# Patient Record
Sex: Female | Born: 1985 | Race: Black or African American | Hispanic: No | Marital: Married | Smoking: Never smoker
Health system: Southern US, Community
[De-identification: ages and names within clinical notes are randomized; demographics above are authoritative.]

## PROBLEM LIST (undated history)

## (undated) DIAGNOSIS — E119 Type 2 diabetes mellitus without complications: Secondary | ICD-10-CM

## (undated) DIAGNOSIS — E282 Polycystic ovarian syndrome: Secondary | ICD-10-CM

## (undated) DIAGNOSIS — E785 Hyperlipidemia, unspecified: Secondary | ICD-10-CM

## (undated) DIAGNOSIS — I4891 Unspecified atrial fibrillation: Secondary | ICD-10-CM

## (undated) DIAGNOSIS — I10 Essential (primary) hypertension: Secondary | ICD-10-CM

## (undated) HISTORY — DX: Unspecified atrial fibrillation: I48.91

## (undated) HISTORY — PX: COSMETIC SURGERY: SHX468

## (undated) HISTORY — DX: Polycystic ovarian syndrome: E28.2

## (undated) HISTORY — DX: Type 2 diabetes mellitus without complications: E11.9

## (undated) HISTORY — DX: Essential (primary) hypertension: I10

## (undated) HISTORY — DX: Hyperlipidemia, unspecified: E78.5

---
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
## 2006-04-13 ENCOUNTER — Emergency Department (HOSPITAL_COMMUNITY): Admission: EM | Admit: 2006-04-13 | Discharge: 2006-04-13 | Payer: Self-pay | Admitting: Emergency Medicine

## 2008-04-04 ENCOUNTER — Encounter: Admission: RE | Admit: 2008-04-04 | Discharge: 2008-04-04 | Payer: Self-pay | Admitting: Emergency Medicine

## 2009-05-24 ENCOUNTER — Emergency Department (HOSPITAL_COMMUNITY): Admission: EM | Admit: 2009-05-24 | Discharge: 2009-05-24 | Payer: Self-pay | Admitting: Gastroenterology

## 2011-10-06 ENCOUNTER — Ambulatory Visit (INDEPENDENT_AMBULATORY_CARE_PROVIDER_SITE_OTHER): Payer: 59 | Admitting: Family Medicine

## 2011-10-06 VITALS — BP 120/72 | HR 74 | Temp 98.0°F | Resp 16 | Ht 66.5 in | Wt 261.0 lb

## 2011-10-06 DIAGNOSIS — Z23 Encounter for immunization: Secondary | ICD-10-CM

## 2011-10-06 DIAGNOSIS — N938 Other specified abnormal uterine and vaginal bleeding: Secondary | ICD-10-CM

## 2011-10-06 DIAGNOSIS — Z3009 Encounter for other general counseling and advice on contraception: Secondary | ICD-10-CM

## 2011-10-06 DIAGNOSIS — Z0000 Encounter for general adult medical examination without abnormal findings: Secondary | ICD-10-CM

## 2011-10-06 DIAGNOSIS — N949 Unspecified condition associated with female genital organs and menstrual cycle: Secondary | ICD-10-CM

## 2011-10-06 DIAGNOSIS — N926 Irregular menstruation, unspecified: Secondary | ICD-10-CM

## 2011-10-06 LAB — POCT CBC
HCT, POC: 43.5 % (ref 37.7–47.9)
MCH, POC: 28 pg (ref 27–31.2)
MCV: 91 fL (ref 80–97)
MID (cbc): 0.4 (ref 0–0.9)
POC LYMPH PERCENT: 30 %L (ref 10–50)
Platelet Count, POC: 357 10*3/uL (ref 142–424)
RBC: 4.78 M/uL (ref 4.04–5.48)
WBC: 6 10*3/uL (ref 4.6–10.2)

## 2011-10-06 LAB — COMPREHENSIVE METABOLIC PANEL
AST: 15 U/L (ref 0–37)
Albumin: 4.1 g/dL (ref 3.5–5.2)
BUN: 13 mg/dL (ref 6–23)
Calcium: 9.6 mg/dL (ref 8.4–10.5)
Chloride: 104 mEq/L (ref 96–112)
Creat: 0.67 mg/dL (ref 0.50–1.10)
Glucose, Bld: 96 mg/dL (ref 70–99)
Potassium: 4.3 mEq/L (ref 3.5–5.3)

## 2011-10-06 LAB — LIPID PANEL
Cholesterol: 143 mg/dL (ref 0–200)
LDL Cholesterol: 88 mg/dL (ref 0–99)
VLDL: 10 mg/dL (ref 0–40)

## 2011-10-06 LAB — POCT URINE PREGNANCY: Preg Test, Ur: NEGATIVE

## 2011-10-06 MED ORDER — NORGESTIMATE-ETH ESTRADIOL 0.25-35 MG-MCG PO TABS: 1.0000 | ORAL_TABLET | Freq: Every day | ORAL | Status: DC

## 2011-10-06 NOTE — Patient Instructions (Signed)
Abstinence or strict condom use for next 2 weeks, then if negative urine pregnancy test, can start sprintec - 1 per day. See instructions below. Your should receive a call or letter about your lab results within the next week to 10 days.  Return to the clinic or go to the nearest emergency room if any of your symptoms worsen or new symptoms occur. Oral Contraception Use Oral contraceptives (OCs) are medicines taken to prevent pregnancy. OCs work by preventing the ovaries from releasing eggs. The hormones in OCs also cause the cervical mucus to thicken, preventing the sperm from entering the uterus. The hormones also cause the uterine lining to become thin, not allowing a fertilized egg to attach to the inside of the uterus. OCs are highly effective when taken exactly as prescribed. However, OCs do not prevent sexually transmitted diseases (STDs). Safe sex practices, such as using condoms along with an OC, can help prevent STDs.  Before taking OCs, you may have a physical exam and Pap test. Your caregiver may also order blood tests if necessary. Your caregiver will make sure you are a good candidate for oral contraception. Discuss with your caregiver the possible side effects of the OC you may be prescribed. When starting an OC, it can take 2 to 3 months for the body to adjust to the changes in hormone levels in your body.  HOW TO TAKE ORAL CONTRACEPTIVES Your caregiver may advise you on how to start taking the first cycle of OCs. Otherwise, you can:  Start on day 1 of your menstrual period. You will not need any backup contraceptive protection with this start time.   Start on the first Sunday after your menstrual period or the day you get your prescription. In these cases, you will need to use backup contraceptive protection for the first 7-day cycle.  After you have started taking OCs:  If you forget to take 1 pill, take it as soon as you remember. Take the next pill at the regular time.   If you  miss 2 or more pills, use backup birth control until your next menstrual period starts.   If you use a 28-day pack that contains inactive pills and you miss 1 of the last 7 pills (pills with no hormones), it will not matter. Throw away the rest of the non-hormone pills and start a new pill pack.  No matter which day you start the OC, you will always start a new pack on that same day of the week. Have an extra pack of OCs and a backup contraceptive method available in case you miss some pills or lose your OC pack. HOME CARE INSTRUCTIONS   Do not smoke.   Always use a condom to protect against STDs. OCs do not protect against STDs.   Use a calendar to mark your menstrual period days.   Read the information and directions that come with your OC. Talk to your caregiver if you have questions.  SEEK MEDICAL CARE IF:   You develop nausea and vomiting.   You have abnormal vaginal discharge or bleeding.   You develop a rash.   You miss your menstrual period.   You are losing your hair.   You need treatment for mood swings or depression.   You get dizzy when taking the OC.   You develop acne from taking the OC.   You become pregnant.  SEEK IMMEDIATE MEDICAL CARE IF:   You develop chest pain.   You develop shortness of  breath.   You have an uncontrolled or severe headache.   You develop numbness or slurred speech.   You develop visual problems.   You develop pain, redness, and swelling in the legs.  Document Released: 12/19/2010 Document Reviewed: 12/17/2010 [facility] Patient Information 2012 [place], [place].

## 2011-10-06 NOTE — Progress Notes (Signed)
Subjective:    Patient ID: Pamela Duarte, female    DOB: Jan 08, 1986, 26 y.o.   MRN: [id number]  HPI Pamela Duarte is a 26 y.o. female  Would like physical, but concerned that has not had a period since July, Hx of irregular periods since 26yo. Usually only misses 1 month, then restarts. Pregnancy tests at home negative - including last night. No ocp's. No recent change in weight, no change in exercise.  Sexually active - G1P0100 in 2006.  elective Ab at 3 months. Boyfriend had vasectomy.  Tried few contraceptives, but would forget to take. Nonsmoker. Last Pap in 2009 -normal. No known hx of abnormals.  Has not had Gardasil. No known hx of menactra.   Plans.on travel nursing.  Needs Hep B titer, MMR titer, varicella titer - thinks had done in 2010 - employee health should have.     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   Review of Systems 13 point ROS noted on PHS.  menstrual problems see above. No other positive responses.     Objective:   Physical Exam  Constitutional: She is oriented to person, place, and time. She appears well-developed and well-nourished.       Overweight.   HENT:  Head: Normocephalic and atraumatic.  Right Ear: External ear normal.  Left Ear: External ear normal.  Mouth/Throat: Oropharynx is clear and moist.  Eyes: Conjunctivae normal are normal. Pupils are equal, round, and reactive to light.  Neck: Normal range of motion. Neck supple. No thyromegaly present.  Cardiovascular: Normal rate, regular rhythm, normal heart sounds and intact distal pulses.   No murmur heard. Pulmonary/Chest: Effort normal and breath sounds normal. No respiratory distress. She has no wheezes.  Abdominal: Soft. Bowel sounds are normal. There is no tenderness.  Genitourinary: Vagina normal. Pelvic exam was performed with patient supine.       Pap smear obtained, but difficult exam for cervical swab/brush - discussed possible repeat if  insufficient.  Musculoskeletal: Normal range of motion. She exhibits no edema and no tenderness.  Lymphadenopathy:    She has no cervical adenopathy.  Neurological: She is alert and oriented to person, place, and time.  Skin: Skin is warm and dry. No rash noted.  Psychiatric: She has a normal mood and affect. Her behavior is normal. Thought content normal.    Results for orders placed in visit on 10/06/11  POCT URINE PREGNANCY      Component Value Range   Preg Test, Ur Negative         Assessment & Plan:  Pamela Duarte is a 26 y.o. female 1. Menstrual period late  POCT urine pregnancy  2. Annual physical exam  POCT CBC, Comprehensive metabolic panel, Lipid panel, HPV vaccine quadravalent 3 dose IM, Pap IG, CT/NG w/ reflex HPV when ASC-U, HPV vaccine quadravalent 3 dose IM  3. Counseling for birth control, oral contraceptives  Pap IG, CT/NG w/ reflex HPV when ASC-U, norgestimate-ethinyl estradiol (ORTHO-CYCLEN,SPRINTEC,PREVIFEM) 0.25-35 MG-MCG tablet  4. Need for HPV vaccination  HPV vaccine quadravalent 3 dose IM, HPV vaccine quadravalent 3 dose IM  5. Need for meningococcus vaccine  Meningococcal conjugate vaccine 4-valent IM   Overweight may be contributor to irregular menses.  Options for contraception discussed - will start Sprintec.  Breakthrough bleeding discussed.   Menactra given as MICU nurse and possible exposure. Gardasil #1 given.  Labs as above.    Patient Instructions  Abstinence or strict condom use  for next 2 weeks, then if negative urine pregnancy test, can start sprintec - 1 per day. See instructions below. Your should receive a call or letter about your lab results within the next week to 10 days.  Return to the clinic or go to the nearest emergency room if any of your symptoms worsen or new symptoms occur. Oral Contraception Use Oral contraceptives (OCs) are medicines taken to prevent pregnancy. OCs work by preventing the ovaries from releasing eggs. The  hormones in OCs also cause the cervical mucus to thicken, preventing the sperm from entering the uterus. The hormones also cause the uterine lining to become thin, not allowing a fertilized egg to attach to the inside of the uterus. OCs are highly effective when taken exactly as prescribed. However, OCs do not prevent sexually transmitted diseases (STDs). Safe sex practices, such as using condoms along with an OC, can help prevent STDs.  Before taking OCs, you may have a physical exam and Pap test. Your caregiver may also order blood tests if necessary. Your caregiver will make sure you are a good candidate for oral contraception. Discuss with your caregiver the possible side effects of the OC you may be prescribed. When starting an OC, it can take 2 to 3 months for the body to adjust to the changes in hormone levels in your body.  HOW TO TAKE ORAL CONTRACEPTIVES Your caregiver may advise you on how to start taking the first cycle of OCs. Otherwise, you can:  Start on day 1 of your menstrual period. You will not need any backup contraceptive protection with this start time.   Start on the first Sunday after your menstrual period or the day you get your prescription. In these cases, you will need to use backup contraceptive protection for the first 7-day cycle.  After you have started taking OCs:  If you forget to take 1 pill, take it as soon as you remember. Take the next pill at the regular time.   If you miss 2 or more pills, use backup birth control until your next menstrual period starts.   If you use a 28-day pack that contains inactive pills and you miss 1 of the last 7 pills (pills with no hormones), it will not matter. Throw away the rest of the non-hormone pills and start a new pill pack.  No matter which day you start the OC, you will always start a new pack on that same day of the week. Have an extra pack of OCs and a backup contraceptive method available in case you miss some pills or lose  your OC pack. HOME CARE INSTRUCTIONS   Do not smoke.   Always use a condom to protect against STDs. OCs do not protect against STDs.   Use a calendar to mark your menstrual period days.   Read the information and directions that come with your OC. Talk to your caregiver if you have questions.  SEEK MEDICAL CARE IF:   You develop nausea and vomiting.   You have abnormal vaginal discharge or bleeding.   You develop a rash.   You miss your menstrual period.   You are losing your hair.   You need treatment for mood swings or depression.   You get dizzy when taking the OC.   You develop acne from taking the OC.   You become pregnant.  SEEK IMMEDIATE MEDICAL CARE IF:   You develop chest pain.   You develop shortness of breath.   You  have an uncontrolled or severe headache.   You develop numbness or slurred speech.   You develop visual problems.   You develop pain, redness, and swelling in the legs.  Document Released: 12/19/2010 Document Reviewed: 12/17/2010 [facility] Patient Information 2012 [place], [place].

## 2011-10-08 LAB — PAP IG, CT-NG, RFX HPV ASCU

## 2011-10-23 ENCOUNTER — Telehealth: Payer: Self-pay

## 2011-10-23 NOTE — Telephone Encounter (Signed)
Spoke with pt and answered questions, Dr Neva Seat has sheet. Awaiting for him to sign.

## 2011-10-23 NOTE — Progress Notes (Signed)
  Tuberculosis Risk Questionnaire  1. Were you born outside the [place] in one of the following parts of the world:    [place], [place], [place], [place] or [place]?  No   2. Have you traveled outside the [place] and lived for more than one month in one of the following parts of the world:  [place], [place], [place], [place] or [place]?  No   3. Do you have a compromised immune system such as from any of the following conditions:  HIV/AIDS, organ or bone marrow transplantation, diabetes, immunosuppressive   medicines (e.g. Prednisone, Remicaide), leukemia, lymphoma, cancer of the   head or neck, gastrectomy or jejunal bypass, end-stage renal disease (on   dialysis), or silicosis?   No    4. Have you ever done one of the following:    Used crack cocaine, injected illegal drugs, worked or resided in jail or prison,   worked or resided at a homeless shelter, or worked as a Research scientist (physical sciences) in   direct contact with patients?  No  5. Have you ever been exposed to anyone with infectious tuberculosis?  No   Tuberculosis Symptom Questionnaire  Do you currently have any of the following symptoms?  1. Unexplained cough lasting more than 3 weeks?  No  Unexplained fever lasting more than 3 weeks. No   3. Night Sweats (sweating that leaves the bedclothes and sheets wet)  No  4. Shortness of Breath  No  5. Chest Pain No   6. Unintentional weight loss  No  7. Unexplained fatigue (very tired for no reason) No

## 2011-10-23 NOTE — Telephone Encounter (Signed)
Pt brought form by to be filled out/signed by Dr Neva Seat from [facility] done end of Sept. I have put the form in Dr Paralee Cancel box.

## 2011-10-23 NOTE — Telephone Encounter (Signed)
I reviewd paperwork, but TB history/screening not done at office visit. Call pt - review questions on tb risk and sx questionaire.  I will review/complete form based on this.

## 2011-10-24 NOTE — Telephone Encounter (Signed)
Dr Paralee Cancel asst, Shanda Bumps faxed form and notified pt that it was done. Marked form to be scanned.

## 2011-10-24 NOTE — Telephone Encounter (Signed)
Form completed and ready for p/u

## 2012-03-24 ENCOUNTER — Ambulatory Visit (INDEPENDENT_AMBULATORY_CARE_PROVIDER_SITE_OTHER): Payer: No Typology Code available for payment source | Admitting: Emergency Medicine

## 2012-03-24 ENCOUNTER — Ambulatory Visit: Payer: No Typology Code available for payment source

## 2012-03-24 VITALS — BP 118/82 | HR 93 | Temp 98.3°F | Resp 18 | Ht 67.0 in | Wt 265.0 lb

## 2012-03-24 DIAGNOSIS — Z111 Encounter for screening for respiratory tuberculosis: Secondary | ICD-10-CM

## 2012-03-24 DIAGNOSIS — R7611 Nonspecific reaction to tuberculin skin test without active tuberculosis: Secondary | ICD-10-CM

## 2012-03-24 NOTE — Progress Notes (Signed)
  Subjective:    Patient ID: Pamela Duarte, female    DOB: 19-Feb-1985, 27 y.o.   MRN: [id number]  HPI 27 year old female presents for tuberculosis screening. States she had the BCG vaccine as a child and has had to get CXR's annually since.  Has always had negative CXR's and had a negative quantiferon gold last year.  Denies cough, night sweats, fever, chills, weight loss, or hemoptysis.      Review of Systems  Constitutional: Negative for fever, chills and unexpected weight change.  Respiratory: Negative for cough and shortness of breath.        Objective:   Physical Exam  Constitutional: She is oriented to person, place, and time. She appears well-developed and well-nourished.  HENT:  Head: Normocephalic and atraumatic.  Right Ear: External ear normal.  Left Ear: External ear normal.  Eyes: Conjunctivae are normal.  Neck: Normal range of motion.  Cardiovascular: Normal rate, regular rhythm and normal heart sounds.   Pulmonary/Chest: Effort normal and breath sounds normal.  Neurological: She is alert and oriented to person, place, and time.  Psychiatric: She has a normal mood and affect. Her behavior is normal. Judgment and thought content normal.      [facility] reading (PRIMARY) by  Dr. Cleta Alberts as no evidence of tuberculosis. Normal chest x-ray.      Assessment & Plan:  1. Screening for tuberculosis  -Patient screened and found to be free of tuberculosis in the communicable form.   -Follow up as needed 2. PPD positive - Plan: DG Chest 1 View

## 2012-04-15 ENCOUNTER — Ambulatory Visit (HOSPITAL_COMMUNITY)
Admission: RE | Admit: 2012-04-15 | Discharge: 2012-04-15 | Disposition: A | Discharge status: Home / Self Care | Payer: No Typology Code available for payment source | Source: Ambulatory Visit | Attending: Family Medicine | Admitting: Family Medicine

## 2012-04-15 ENCOUNTER — Ambulatory Visit (INDEPENDENT_AMBULATORY_CARE_PROVIDER_SITE_OTHER): Payer: No Typology Code available for payment source | Admitting: Family Medicine

## 2012-04-15 VITALS — BP 114/76 | HR 119 | Temp 101.2°F | Resp 16 | Ht 67.0 in | Wt 258.0 lb

## 2012-04-15 DIAGNOSIS — R1031 Right lower quadrant pain: Secondary | ICD-10-CM | POA: Insufficient documentation

## 2012-04-15 DIAGNOSIS — R109 Unspecified abdominal pain: Secondary | ICD-10-CM

## 2012-04-15 LAB — POCT WET PREP WITH KOH
KOH Prep POC: NEGATIVE
Trichomonas, UA: NEGATIVE
Yeast Wet Prep HPF POC: NEGATIVE

## 2012-04-15 LAB — POCT CBC
Granulocyte percent: 75.8 %G (ref 37–80)
HCT, POC: 35.2 % — AB (ref 37.7–47.9)
Hemoglobin: 11 g/dL — AB (ref 12.2–16.2)
Lymph, poc: 2.1 (ref 0.6–3.4)
MCH, POC: 27.8 pg (ref 27–31.2)
MCHC: 31.3 g/dL — AB (ref 31.8–35.4)
MCV: 89.2 fL (ref 80–97)
MID (cbc): 0.7 (ref 0–0.9)
MPV: 8.3 fL (ref 0–99.8)
POC Granulocyte: 8.9 — AB (ref 2–6.9)
POC LYMPH PERCENT: 18.1 %L (ref 10–50)
POC MID %: 6.1 %M (ref 0–12)
Platelet Count, POC: 424 10*3/uL (ref 142–424)
RBC: 3.95 M/uL — AB (ref 4.04–5.48)
RDW, POC: 13.5 %
WBC: 11.7 10*3/uL — AB (ref 4.6–10.2)

## 2012-04-15 LAB — POCT URINALYSIS DIPSTICK
Bilirubin, UA: NEGATIVE
Blood, UA: NEGATIVE
Glucose, UA: NEGATIVE
Ketones, UA: NEGATIVE
Leukocytes, UA: NEGATIVE
Nitrite, UA: NEGATIVE
Protein, UA: NEGATIVE
Spec Grav, UA: 1.02
Urobilinogen, UA: 1
pH, UA: 7

## 2012-04-15 LAB — POCT UA - MICROSCOPIC ONLY
Casts, Ur, LPF, POC: NEGATIVE
Crystals, Ur, HPF, POC: NEGATIVE
Mucus, UA: NEGATIVE
Yeast, UA: NEGATIVE

## 2012-04-15 LAB — POCT URINE PREGNANCY: Preg Test, Ur: NEGATIVE

## 2012-04-15 MED ORDER — IOHEXOL 300 MG/ML  SOLN
100.0000 mL | Freq: Once | INTRAMUSCULAR | Status: AC | PRN
  Administered 2012-04-15: 100 mL via INTRAVENOUS

## 2012-04-15 NOTE — Progress Notes (Signed)
Subjective:    Patient ID: Pamela Duarte, female    DOB: 24-Feb-1985, 27 y.o.   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 She did not have diarrhea or vomiting. She states it was just nausea. She states it resolved but this past week she complains of abd pain on her right side mostly on her right side. It origniated in the epigastric region but now radiates to her right mid side. She describes it as it feels as if it is pushing against something.   Denies any urinary complaints. She did not know she had a fever until today.   Review of Systems No vomiting or diarrhea    Objective:   Physical Exam Alert and cooperative, moves easily in room HEENT:  Unremarkable Chest:  Clear Heart:  Reg, no murmur Abdomen:  Soft, mildly tender right sided of abdomen, no HSM, no mass  Unexpectedly, patient shows rebound to lower abdomen. Pelvic:   Moderate discharge, nontender cervix, NEFG  Results for orders placed in visit on 04/15/12  POCT URINE PREGNANCY      Result Value Range   Preg Test, Ur Negative    POCT CBC      Result Value Range   WBC 11.7 (*) 4.6 - 10.2 K/uL   Lymph, poc 2.1  0.6 - 3.4   POC LYMPH PERCENT 18.1  10 - 50 %L   MID (cbc) 0.7  0 - 0.9   POC MID % 6.1  0 - 12 %M   POC Granulocyte 8.9 (*) 2 - 6.9   Granulocyte percent 75.8  37 - 80 %G   RBC 3.95 (*) 4.04 - 5.48 M/uL   Hemoglobin 11.0 (*) 12.2 - 16.2 g/dL   HCT, POC 57.8 (*) 46.9 - 47.9 %   MCV 89.2  80 - 97 fL   MCH, POC 27.8  27 - 31.2 pg   MCHC 31.3 (*) 31.8 - 35.4 g/dL   RDW, POC 62.9     Platelet Count, POC 424  142 - 424 K/uL   MPV 8.3  0 - 99.8 fL  POCT URINALYSIS DIPSTICK      Result Value Range   Color, UA yellow     Clarity, UA clear     Glucose, UA neg     Bilirubin, UA neg     Ketones, UA neg     Spec Grav, UA 1.020     Blood, UA neg     pH, UA 7.0     Protein, UA neg     Urobilinogen, UA 1.0      Nitrite, UA neg     Leukocytes, UA Negative    POCT UA - MICROSCOPIC ONLY      Result Value Range   WBC, Ur, HPF, POC 0-2     RBC, urine, microscopic 0-1     Bacteria, U Microscopic trace     Mucus, UA neg     Epithelial cells, urine per micros 1-2     Crystals, Ur, HPF, POC neg     Casts, Ur, LPF, POC neg     Yeast, UA neg    POCT WET PREP WITH KOH      Result Value Range   Trichomonas, UA Negative     Clue Cells Wet Prep HPF POC 0-1     Epithelial Wet Prep HPF POC 2-5  Yeast Wet Prep HPF POC neg     Bacteria Wet Prep HPF POC small     RBC Wet Prep HPF POC 0-1     WBC Wet Prep HPF POC 8-12     KOH Prep POC Negative          Assessment & Plan:  With rebound and elevated white count, patient really needs to have a CT with and without contrast of her abdomen and pelvis. It appears that she has some sort of intra-abdominal infection or inflammation which at this point is undiagnosed.

## 2012-04-16 ENCOUNTER — Ambulatory Visit (INDEPENDENT_AMBULATORY_CARE_PROVIDER_SITE_OTHER): Payer: No Typology Code available for payment source | Admitting: Family Medicine

## 2012-04-16 VITALS — BP 106/64 | HR 113 | Temp 99.2°F | Resp 16 | Ht 66.78 in | Wt 253.6 lb

## 2012-04-16 DIAGNOSIS — R109 Unspecified abdominal pain: Secondary | ICD-10-CM

## 2012-04-16 LAB — POCT CBC
Granulocyte percent: 82.9 %G — AB (ref 37–80)
HCT, POC: 37.8 % (ref 37.7–47.9)
Hemoglobin: 12 g/dL — AB (ref 12.2–16.2)
Lymph, poc: 1.6 (ref 0.6–3.4)
MCH, POC: 28.3 pg (ref 27–31.2)
MCHC: 31.7 g/dL — AB (ref 31.8–35.4)
MCV: 89.1 fL (ref 80–97)
MID (cbc): 0.6 (ref 0–0.9)
MPV: 8.2 fL (ref 0–99.8)
POC Granulocyte: 10.4 — AB (ref 2–6.9)
POC LYMPH PERCENT: 12.6 %L (ref 10–50)
POC MID %: 4.5 %M (ref 0–12)
Platelet Count, POC: 445 10*3/uL — AB (ref 142–424)
RBC: 4.24 M/uL (ref 4.04–5.48)
RDW, POC: 14.5 %
WBC: 12.5 10*3/uL — AB (ref 4.6–10.2)

## 2012-04-16 MED ORDER — CIPROFLOXACIN HCL 500 MG PO TABS: 500.0000 mg | ORAL_TABLET | Freq: Two times a day (BID) | ORAL | Status: DC

## 2012-04-16 NOTE — Patient Instructions (Signed)
Clinical Data: Right sided abdominal pain  CT ABDOMEN AND PELVIS WITH CONTRAST  Technique: Multidetector CT imaging of the abdomen and pelvis was  performed following the standard protocol during bolus  administration of intravenous contrast.  Contrast: OMNIPAQUE IOHEXOL 300 MG/ML SOLN  Comparison: None.  Findings: Normal appendix. No disproportionate dilatation of bowel  to suggest obstruction.  Innumerable small mesenteric lymph nodes are present. They are  primarily located within the central small bowel mesentery.  Multiple small retroperitoneal lymph nodes are also noted. None  are greater than 1 cm short axis diameter.  There is a small amount of free fluid layering in the pelvis.  There is an enhancing rim of tissues surrounding the fluid. Is  also a noticeable stranding within the peritoneal fat of the left  lower quadrant and left hemi pelvis. No focal bowel wall  thickening. No extraluminal bowel gas. Uterus is unremarkable.  Cystic changes of the right ovary are present.  Liver, gallbladder, spleen, pancreas, adrenal glands, kidneys are  within normal limits.  No acute bony deformity.  IMPRESSION:  Normal appendix.  There are inflammatory changes within the fat of the left lower  quadrant and left hemi pelvis without associated bowel wall  thickening. There is complicated fluid layering in the pelvis as  described. Considerations would include an infectious process or  hemorrhage such as seen with a ruptured hemorrhagic ovarian cyst.  Inflammatory nodes throughout the small bowel mesentery.  Original Report Authenticated By: Jolaine Click, M.D.

## 2012-04-16 NOTE — Progress Notes (Signed)
Sx:  Continued abdominal pain, more so on the right.  Slept okay last night and has tolerated the pain without eating today.  Went to work this morning. Felt a little sweaty last night. Took tylenol last night  Obj:   Clinical Data: Right sided abdominal pain  CT ABDOMEN AND PELVIS WITH CONTRAST  Technique: Multidetector CT imaging of the abdomen and pelvis was  performed following the standard protocol during bolus  administration of intravenous contrast.  Contrast: OMNIPAQUE IOHEXOL 300 MG/ML SOLN  Comparison: None.  Findings: Normal appendix. No disproportionate dilatation of bowel  to suggest obstruction.  Innumerable small mesenteric lymph nodes are present. They are  primarily located within the central small bowel mesentery.  Multiple small retroperitoneal lymph nodes are also noted. None  are greater than 1 cm short axis diameter.  There is a small amount of free fluid layering in the pelvis.  There is an enhancing rim of tissues surrounding the fluid. Is  also a noticeable stranding within the peritoneal fat of the left  lower quadrant and left hemi pelvis. No focal bowel wall  thickening. No extraluminal bowel gas. Uterus is unremarkable.  Cystic changes of the right ovary are present.  Liver, gallbladder, spleen, pancreas, adrenal glands, kidneys are  within normal limits.  No acute bony deformity.  IMPRESSION:  Normal appendix.  There are inflammatory changes within the fat of the left lower  quadrant and left hemi pelvis without associated bowel wall  thickening. There is complicated fluid layering in the pelvis as  described. Considerations would include an infectious process or  hemorrhage such as seen with a ruptured hemorrhagic ovarian cyst.  Inflammatory nodes throughout the small bowel mesentery.  Original Report Authenticated By: Jolaine Click, M.D.  Abdomen:  Unchanged.  Mild pain right side mid abdomen with rebound. No mass

## 2012-04-17 ENCOUNTER — Telehealth: Payer: Self-pay | Admitting: Radiology

## 2012-04-17 LAB — GC/CHLAMYDIA PROBE AMP
CT Probe RNA: POSITIVE — AB
GC Probe RNA: NEGATIVE

## 2012-04-17 LAB — HIV ANTIBODY (ROUTINE TESTING W REFLEX): HIV: NONREACTIVE

## 2012-04-17 NOTE — Telephone Encounter (Signed)
Message copied by Sheldon Silvan on Sat Apr 17, 2012  6:15 PM ------      Message from: Elvina Sidle      Created: Sat Apr 17, 2012  9:11 AM      Regarding: RE: Gloris Manchester of Order # [id number]       Please explain why this was cancelled      ----- Message -----         From: [facility]         Sent: 04/16/2012   3:28 PM           To: Elvina Sidle, MD      Subject: Cancellation of Order # [id number]                         Order number [id number] for the procedure GC/CHLAMYDIA PROBE AMP,       GENITAL [[id number]] has been canceled by [facility]       [facility] [[id number]]. This procedure was ordered by Elvina Sidle,       MD [[id number]] on Apr 15, 2012 for the patient Pamela Duarte [[id number]]. The reason for cancellation was "None".       ------

## 2012-04-17 NOTE — Telephone Encounter (Signed)
It was cancelled because we needed to ensure it was the right GC probe; it was reordered FYI and performed; you'll see this in the labs

## 2012-04-18 ENCOUNTER — Other Ambulatory Visit: Payer: Self-pay | Admitting: Family Medicine

## 2012-04-18 DIAGNOSIS — A749 Chlamydial infection, unspecified: Secondary | ICD-10-CM

## 2012-04-18 MED ORDER — AZITHROMYCIN 500 MG PO TABS: 1000.0000 mg | ORAL_TABLET | Freq: Once | ORAL | Status: DC

## 2012-04-19 ENCOUNTER — Telehealth: Payer: Self-pay

## 2012-04-19 NOTE — Telephone Encounter (Signed)
Pt has questions about recent labs. Sees dr Milus Glazier  bf

## 2012-04-19 NOTE — Telephone Encounter (Signed)
Called her, she has Chlamydia. She wants to know if her partner needs to be treated, I advised her he does, and if she has sexual contact with him she will get it again, even if she has been treated. She is advised to let him know. She is also advised if she does not want to let him know, the health department can be the one to notify him. [name]

## 2012-04-20 ENCOUNTER — Telehealth: Payer: Self-pay | Admitting: Radiology

## 2012-04-20 NOTE — Telephone Encounter (Signed)
This test was cancelled because it was the wrong test code for [facility] and it was reordered with the right test code. The test has been resulted and the pt has been notified.

## 2012-04-20 NOTE — Telephone Encounter (Signed)
Message copied by Marinus Maw on Tue Apr 20, 2012  1:29 PM ------      Message from: Elvina Sidle      Created: Sat Apr 17, 2012  9:11 AM      Regarding: RE: Gloris Manchester of Order # [id number]       Please explain why this was cancelled      ----- Message -----         From: [facility]         Sent: 04/16/2012   3:28 PM           To: Elvina Sidle, MD      Subject: Cancellation of Order # [id number]                         Order number [id number] for the procedure GC/CHLAMYDIA PROBE AMP,       GENITAL [[id number]] has been canceled by [facility]       [facility] [[id number]]. This procedure was ordered by Elvina Sidle,       MD [[id number]] on Apr 15, 2012 for the patient Pamela Duarte [[id number]]. The reason for cancellation was "None".       ------

## 2012-04-24 ENCOUNTER — Ambulatory Visit (INDEPENDENT_AMBULATORY_CARE_PROVIDER_SITE_OTHER): Payer: No Typology Code available for payment source | Admitting: Family Medicine

## 2012-04-24 VITALS — BP 124/82 | HR 84 | Temp 98.1°F | Resp 16 | Ht 66.0 in | Wt 254.0 lb

## 2012-04-24 DIAGNOSIS — A749 Chlamydial infection, unspecified: Secondary | ICD-10-CM

## 2012-04-24 LAB — HEPATITIS B SURFACE ANTIGEN: Hepatitis B Surface Ag: NEGATIVE

## 2012-04-24 LAB — RPR

## 2012-04-24 MED ORDER — AZITHROMYCIN 500 MG PO TABS: 1000.0000 mg | ORAL_TABLET | Freq: Once | ORAL | Status: DC

## 2012-04-24 NOTE — Progress Notes (Signed)
27 yo woman from [place] with boyfriend there.  She tested positive for chlamydia and now she is stressed.  She vomited the zithromax the first time, but took it on an empty stomach and would like to take it again, this time with food.  Also would like to be tested for other STD's Note:  HIV came back negative Objective:   Results for orders placed in visit on 04/16/12  HIV ANTIBODY (ROUTINE TESTING)      Result Value Range   HIV NON REACTIVE  NON REACTIVE  POCT CBC      Result Value Range   WBC 12.5 (*) 4.6 - 10.2 K/uL   Lymph, poc 1.6  0.6 - 3.4   POC LYMPH PERCENT 12.6  10 - 50 %L   MID (cbc) 0.6  0 - 0.9   POC MID % 4.5  0 - 12 %M   POC Granulocyte 10.4 (*) 2 - 6.9   Granulocyte percent 82.9 (*) 37 - 80 %G   RBC 4.24  4.04 - 5.48 M/uL   Hemoglobin 12.0 (*) 12.2 - 16.2 g/dL   HCT, POC 16.1  09.6 - 47.9 %   MCV 89.1  80 - 97 fL   MCH, POC 28.3  27 - 31.2 pg   MCHC 31.7 (*) 31.8 - 35.4 g/dL   RDW, POC 04.5     Platelet Count, POC 445 (*) 142 - 424 K/uL   MPV 8.2  0 - 99.8 fL     P: Chlamydia - Plan: azithromycin (ZITHROMAX) 500 MG tablet, RPR, Hepatitis B surface antigen

## 2012-04-26 NOTE — Telephone Encounter (Signed)
Patient would like to have Dr Milus Glazier call him back regarding some personal issues.   Best#: [phone]

## 2012-04-27 NOTE — Telephone Encounter (Signed)
Called patient. Left message for her to call me back.  

## 2012-05-03 NOTE — Telephone Encounter (Signed)
Called again, left another message for call back.

## 2012-05-04 NOTE — Telephone Encounter (Signed)
Multiple attempts, patient does not return my calls

## 2012-09-08 ENCOUNTER — Ambulatory Visit (INDEPENDENT_AMBULATORY_CARE_PROVIDER_SITE_OTHER): Payer: No Typology Code available for payment source | Admitting: Family Medicine

## 2012-09-08 VITALS — BP 128/92 | HR 82 | Temp 98.5°F | Resp 18 | Ht 67.0 in | Wt 250.0 lb

## 2012-09-08 DIAGNOSIS — Z0000 Encounter for general adult medical examination without abnormal findings: Secondary | ICD-10-CM

## 2012-09-08 DIAGNOSIS — E669 Obesity, unspecified: Secondary | ICD-10-CM

## 2012-09-08 DIAGNOSIS — B360 Pityriasis versicolor: Secondary | ICD-10-CM

## 2012-09-08 DIAGNOSIS — Z113 Encounter for screening for infections with a predominantly sexual mode of transmission: Secondary | ICD-10-CM

## 2012-09-08 LAB — POCT UA - MICROSCOPIC ONLY
Casts, Ur, LPF, POC: NEGATIVE
Crystals, Ur, HPF, POC: NEGATIVE
RBC, urine, microscopic: NEGATIVE
WBC, Ur, HPF, POC: NEGATIVE
Yeast, UA: NEGATIVE

## 2012-09-08 LAB — POCT URINALYSIS DIPSTICK
Bilirubin, UA: NEGATIVE
Blood, UA: NEGATIVE
Glucose, UA: NEGATIVE
Ketones, UA: NEGATIVE
Leukocytes, UA: NEGATIVE
Nitrite, UA: NEGATIVE
Protein, UA: NEGATIVE
Spec Grav, UA: 1.02
Urobilinogen, UA: 0.2
pH, UA: 6

## 2012-09-08 LAB — POCT CBC
Granulocyte percent: 64.8 %G (ref 37–80)
HCT, POC: 39.9 % (ref 37.7–47.9)
Hemoglobin: 12.7 g/dL (ref 12.2–16.2)
Lymph, poc: 2.2 (ref 0.6–3.4)
MCH, POC: 29.1 pg (ref 27–31.2)
MCHC: 31.8 g/dL (ref 31.8–35.4)
MCV: 91.4 fL (ref 80–97)
MID (cbc): 0.5 (ref 0–0.9)
MPV: 8.8 fL (ref 0–99.8)
POC Granulocyte: 4.8 (ref 2–6.9)
POC LYMPH PERCENT: 29.1 %L (ref 10–50)
POC MID %: 6.1 %M (ref 0–12)
Platelet Count, POC: 308 10*3/uL (ref 142–424)
RBC: 4.37 M/uL (ref 4.04–5.48)
RDW, POC: 13.9 %
WBC: 7.4 10*3/uL (ref 4.6–10.2)

## 2012-09-08 LAB — COMPREHENSIVE METABOLIC PANEL
ALT: 20 U/L (ref 0–35)
AST: 17 U/L (ref 0–37)
Albumin: 4.3 g/dL (ref 3.5–5.2)
Alkaline Phosphatase: 48 U/L (ref 39–117)
BUN: 12 mg/dL (ref 6–23)
CO2: 24 mEq/L (ref 19–32)
Calcium: 9.5 mg/dL (ref 8.4–10.5)
Chloride: 102 mEq/L (ref 96–112)
Creat: 0.7 mg/dL (ref 0.50–1.10)
Glucose, Bld: 98 mg/dL (ref 70–99)
Potassium: 4.1 mEq/L (ref 3.5–5.3)
Sodium: 135 mEq/L (ref 135–145)
Total Bilirubin: 0.4 mg/dL (ref 0.3–1.2)
Total Protein: 7.7 g/dL (ref 6.0–8.3)

## 2012-09-08 LAB — HIV ANTIBODY (ROUTINE TESTING W REFLEX): HIV: NONREACTIVE

## 2012-09-08 LAB — RPR

## 2012-09-08 MED ORDER — FLUCONAZOLE 200 MG PO TABS: 200.0000 mg | ORAL_TABLET | Freq: Once | ORAL | Status: DC

## 2012-09-08 MED ORDER — LORCASERIN HCL 10 MG PO TABS: 10.0000 mg | ORAL_TABLET | Freq: Two times a day (BID) | ORAL | Status: DC

## 2012-09-08 NOTE — Progress Notes (Signed)
Patient ID: Pamela Duarte MRN: [id number], DOB: Mar 19, 1985, 27 y.o. Date of Encounter: 09/08/2012, 10:14 AM  Primary Physician: Tally Due, MD  Chief Complaint: Physical (CPE)  HPI: 27 y.o. y/o female with history of noted below here for CPE.  Doing well. Does EPIC training.   Pap: 2013 Review of Systems: Consitutional: No fever, chills, fatigue, night sweats, lymphadenopathy, or weight changes. Eyes: No visual changes, eye redness, or discharge. ENT/Mouth: Ears: No otalgia, tinnitus, hearing loss, discharge. Nose: No congestion, rhinorrhea, sinus pain, or epistaxis. Throat: No sore throat, post nasal drip, or teeth pain. Cardiovascular: No CP, palpitations, diaphoresis, DOE, edema, orthopnea, PND. Respiratory: No cough, hemoptysis, SOB, or wheezing. Gastrointestinal: No anorexia, dysphagia, reflux, pain, nausea, vomiting, hematemesis, diarrhea, constipation, BRBPR, or melena. Breast: No discharge, pain, swelling, or mass. Genitourinary: No dysuria, frequency, urgency, hematuria, incontinence, nocturia, amenorrhea, vaginal discharge, pruritis, burning, abnormal bleeding, or pain. Musculoskeletal: No decreased ROM, myalgias, stiffness, joint swelling, or weakness. Skin: No, erythema, lesion changes, pain, warmth, jaundice, or pruritis.  Rash on sides which is annular and scaling with no central clearing Neurological: No headache, dizziness, syncope, seizures, tremors, memory loss, coordination problems, or paresthesias. Psychological: No anxiety, depression, hallucinations, SI/HI. Endocrine: No fatigue, polydipsia, polyphagia, polyuria, or known diabetes. All other systems were reviewed and are otherwise negative.  History reviewed. No pertinent past medical history.   History reviewed. No pertinent past surgical history.  Home Meds:  Prior to Admission medications   Medication Sig Start Date End Date Taking? Authorizing Provider  azithromycin (ZITHROMAX) 500 MG  tablet Take 2 tablets (1,000 mg total) by mouth once. 04/24/12   Elvina Sidle, MD  ciprofloxacin (CIPRO) 500 MG tablet Take 1 tablet (500 mg total) by mouth 2 (two) times daily. 04/16/12   Elvina Sidle, MD  fluconazole (DIFLUCAN) 200 MG tablet Take 1 tablet (200 mg total) by mouth once. 09/08/12   Elvina Sidle, MD    Allergies: No Known Allergies  History   Social History  . Marital Status: Single    Spouse Name: N/A    Number of Children: N/A  . Years of Education: N/A   Occupational History  . Not on file.   Social History Main Topics  . Smoking status: Never Smoker   . Smokeless tobacco: Not on file  . Alcohol Use: No  . Drug Use: No  . Sexual Activity: Not on file   Other Topics Concern  . Not on file   Social History Narrative  . No narrative on file    Family History  Problem Relation Age of Onset  . Diabetes Maternal Grandmother   . Hypertension Maternal Grandmother   . Diabetes Paternal Grandfather   . Hypertension Paternal Grandfather   . Cancer Paternal Grandfather   . Hypertension Paternal Grandmother   . Diabetes Paternal Grandmother   . Hypertension Mother     Physical Exam: Blood pressure 128/92, pulse 82, temperature 98.5 F (36.9 C), temperature source Oral, resp. rate 18, height 5\' 7"  (1.702 m), weight 250 lb (113.399 kg), last menstrual period 08/25/2012, SpO2 100.00%., Body mass index is 39.15 kg/(m^2). General: Well developed, well nourished, in no acute distress. HEENT: Normocephalic, atraumatic. Conjunctiva pink, sclera non-icteric. Pupils 2 mm constricting to 1 mm, round, regular, and equally reactive to light and accomodation. EOMI. Internal auditory canal clear. TMs with good cone of light and without pathology. Nasal mucosa pink. Nares are without discharge. No sinus tenderness. Oral mucosa pink. Dentition good. Pharynx without exudate.  Neck: Supple. Trachea midline. No thyromegaly. Full ROM. No lymphadenopathy. Lungs: Clear to  auscultation bilaterally without wheezes, rales, or rhonchi. Breathing is of normal effort and unlabored. Cardiovascular: RRR with S1 S2. No murmurs, rubs, or gallops appreciated. Distal pulses 2+ symmetrically. No carotid or abdominal bruits. Breast: Symmetrical. No masses. Nipples without discharge. Abdomen: Soft, non-tender, non-distended with normoactive bowel sounds. No hepatosplenomegaly or masses. No rebound/guarding. No CVA tenderness. Without hernias.  Musculoskeletal: Full range of motion and 5/5 strength throughout. Without swelling, atrophy, tenderness, crepitus, or warmth. Extremities without clubbing, cyanosis, or edema. Calves supple. Skin: Warm and moist without erythema, ecchymosis, wounds,   She has tinea versicolor Neuro: A+Ox3. CN II-XII grossly intact. Moves all extremities spontaneously. Full sensation throughout. Normal gait. DTR 2+ throughout upper and lower extremities. Finger to nose intact. Psych:  Responds to questions appropriately with a normal affect.   Results for orders placed in visit on 09/08/12  POCT CBC      Result Value Range   WBC 7.4  4.6 - 10.2 K/uL   Lymph, poc 2.2  0.6 - 3.4   POC LYMPH PERCENT 29.1  10 - 50 %L   MID (cbc) 0.5  0 - 0.9   POC MID % 6.1  0 - 12 %M   POC Granulocyte 4.8  2 - 6.9   Granulocyte percent 64.8  37 - 80 %G   RBC 4.37  4.04 - 5.48 M/uL   Hemoglobin 12.7  12.2 - 16.2 g/dL   HCT, POC 95.6  21.3 - 47.9 %   MCV 91.4  80 - 97 fL   MCH, POC 29.1  27 - 31.2 pg   MCHC 31.8  31.8 - 35.4 g/dL   RDW, POC 08.6     Platelet Count, POC 308  142 - 424 K/uL   MPV 8.8  0 - 99.8 fL  POCT URINALYSIS DIPSTICK      Result Value Range   Color, UA yellow     Clarity, UA clear     Glucose, UA neg     Bilirubin, UA neg     Ketones, UA neg     Spec Grav, UA 1.020     Blood, UA neg     pH, UA 6.0     Protein, UA neg     Urobilinogen, UA 0.2     Nitrite, UA neg     Leukocytes, UA Negative    POCT UA - MICROSCOPIC ONLY      Result Value  Range   WBC, Ur, HPF, POC neg     RBC, urine, microscopic neg     Bacteria, U Microscopic trace     Mucus, UA mod     Epithelial cells, urine per micros 0-5     Crystals, Ur, HPF, POC neg     Casts, Ur, LPF, POC neg     Yeast, UA neg       Assessment/Plan:  27 y.o. y/o female here for CPE -Routine general medical examination at a health care facility - Plan: POCT CBC, POCT urinalysis dipstick, POCT UA - Microscopic Only, RPR, HIV antibody, Comprehensive metabolic panel, GC/Chlamydia Probe Amp  Screening for STD (sexually transmitted disease) - Plan: POCT CBC, POCT urinalysis dipstick, POCT UA - Microscopic Only, RPR, HIV antibody, Comprehensive metabolic panel, GC/Chlamydia Probe Amp  Tinea versicolor - Plan: fluconazole (DIFLUCAN) 200 MG tablet  Obesity, unspecified - Plan: Lorcaserin HCl (BELVIQ) 10 MG TABS    Signed, Elvina Sidle, MD 09/08/2012  10:14 AM

## 2012-09-08 NOTE — Patient Instructions (Addendum)
Health Maintenance, Females A healthy lifestyle and preventative care can promote health and wellness.  Maintain regular health, dental, and eye exams.  Eat a healthy diet. Foods like vegetables, fruits, whole grains, low-fat dairy products, and lean protein foods contain the nutrients you need without too many calories. Decrease your intake of foods high in solid fats, added sugars, and salt. Get information about a proper diet from your caregiver, if necessary.  Regular physical exercise is one of the most important things you can do for your health. Most adults should get at least 150 minutes of moderate-intensity exercise (any activity that increases your heart rate and causes you to sweat) each week. In addition, most adults need muscle-strengthening exercises on 2 or more days a week.   Maintain a healthy weight. The body mass index (BMI) is a screening tool to identify possible weight problems. It provides an estimate of body fat based on height and weight. Your caregiver can help determine your BMI, and can help you achieve or maintain a healthy weight. For adults 20 years and older:  A BMI below 18.5 is considered underweight.  A BMI of 18.5 to 24.9 is normal.  A BMI of 25 to 29.9 is considered overweight.  A BMI of 30 and above is considered obese.  Maintain normal blood lipids and cholesterol by exercising and minimizing your intake of saturated fat. Eat a balanced diet with plenty of fruits and vegetables. Blood tests for lipids and cholesterol should begin at age 20 and be repeated every 5 years. If your lipid or cholesterol levels are high, you are over 50, or you are a high risk for heart disease, you may need your cholesterol levels checked more frequently.Ongoing high lipid and cholesterol levels should be treated with medicines if diet and exercise are not effective.  If you smoke, find out from your caregiver how to quit. If you do not use tobacco, do not start.  If you  are pregnant, do not drink alcohol. If you are breastfeeding, be very cautious about drinking alcohol. If you are not pregnant and choose to drink alcohol, do not exceed 1 drink per day. One drink is considered to be 12 ounces (355 mL) of beer, 5 ounces (148 mL) of wine, or 1.5 ounces (44 mL) of liquor.  Avoid use of street drugs. Do not share needles with anyone. Ask for help if you need support or instructions about stopping the use of drugs.  High blood pressure causes heart disease and increases the risk of stroke. Blood pressure should be checked at least every 1 to 2 years. Ongoing high blood pressure should be treated with medicines, if weight loss and exercise are not effective.  If you are 55 to 28 years old, ask your caregiver if you should take aspirin to prevent strokes.  Diabetes screening involves taking a blood sample to check your fasting blood sugar level. This should be done once every 3 years, after age 45, if you are within normal weight and without risk factors for diabetes. Testing should be considered at a younger age or be carried out more frequently if you are overweight and have at least 1 risk factor for diabetes.  Breast cancer screening is essential preventative care for women. You should practice "breast self-awareness." This means understanding the normal appearance and feel of your breasts and may include breast self-examination. Any changes detected, no matter how small, should be reported to a caregiver. Women in their 20s and 30s should have   a clinical breast exam (CBE) by a caregiver as part of a regular health exam every 1 to 3 years. After age 40, women should have a CBE every year. Starting at age 40, women should consider having a mammogram (breast X-ray) every year. Women who have a family history of breast cancer should talk to their caregiver about genetic screening. Women at a high risk of breast cancer should talk to their caregiver about having an MRI and a  mammogram every year.  The Pap test is a screening test for cervical cancer. Women should have a Pap test starting at age 21. Between ages 21 and 29, Pap tests should be repeated every 2 years. Beginning at age 30, you should have a Pap test every 3 years as long as the past 3 Pap tests have been normal. If you had a hysterectomy for a problem that was not cancer or a condition that could lead to cancer, then you no longer need Pap tests. If you are between ages 65 and 70, and you have had normal Pap tests going back 10 years, you no longer need Pap tests. If you have had past treatment for cervical cancer or a condition that could lead to cancer, you need Pap tests and screening for cancer for at least 20 years after your treatment. If Pap tests have been discontinued, risk factors (such as a new sexual partner) need to be reassessed to determine if screening should be resumed. Some women have medical problems that increase the chance of getting cervical cancer. In these cases, your caregiver may recommend more frequent screening and Pap tests.  The human papillomavirus (HPV) test is an additional test that may be used for cervical cancer screening. The HPV test looks for the virus that can cause the cell changes on the cervix. The cells collected during the Pap test can be tested for HPV. The HPV test could be used to screen women aged 30 years and older, and should be used in women of any age who have unclear Pap test results. After the age of 30, women should have HPV testing at the same frequency as a Pap test.  Colorectal cancer can be detected and often prevented. Most routine colorectal cancer screening begins at the age of 50 and continues through age 75. However, your caregiver may recommend screening at an earlier age if you have risk factors for colon cancer. On a yearly basis, your caregiver may provide home test kits to check for hidden blood in the stool. Use of a small camera at the end of a  tube, to directly examine the colon (sigmoidoscopy or colonoscopy), can detect the earliest forms of colorectal cancer. Talk to your caregiver about this at age 50, when routine screening begins. Direct examination of the colon should be repeated every 5 to 10 years through age 75, unless early forms of pre-cancerous polyps or small growths are found.  Hepatitis C blood testing is recommended for all people born from 1945 through 1965 and any individual with known risks for hepatitis C.  Practice safe sex. Use condoms and avoid high-risk sexual practices to reduce the spread of sexually transmitted infections (STIs). Sexually active women aged 25 and younger should be checked for Chlamydia, which is a common sexually transmitted infection. Older women with new or multiple partners should also be tested for Chlamydia. Testing for other STIs is recommended if you are sexually active and at increased risk.  Osteoporosis is a disease in which the   bones lose minerals and strength with aging. This can result in serious bone fractures. The risk of osteoporosis can be identified using a bone density scan. Women ages 30 and over and women at risk for fractures or osteoporosis should discuss screening with their caregivers. Ask your caregiver whether you should be taking a calcium supplement or vitamin D to reduce the rate of osteoporosis.  Menopause can be associated with physical symptoms and risks. Hormone replacement therapy is available to decrease symptoms and risks. You should talk to your caregiver about whether hormone replacement therapy is right for you.  Use sunscreen with a sun protection factor (SPF) of 30 or greater. Apply sunscreen liberally and repeatedly throughout the day. You should seek shade when your shadow is shorter than you. Protect yourself by wearing long sleeves, pants, a wide-brimmed hat, and sunglasses year round, whenever you are outdoors.  Notify your caregiver of new moles or  changes in moles, especially if there is a change in shape or color. Also notify your caregiver if a mole is larger than the size of a pencil eraser.  Stay current with your immunizations. Document Released: 07/15/2010 Document Revised: 03/24/2011 Document Reviewed: 07/15/2010 [facility] Patient Information 2014 [place], [place]. Tinea Versicolor Tinea versicolor is a common yeast infection of the skin. This condition becomes known when the yeast on our skin starts to overgrow (yeast is a normal inhabitant on our skin). This condition is noticed as white or light brown patches on brown skin, and is more evident in the summer on tanned skin. These areas are slightly scaly if scratched. The light patches from the yeast become evident when the yeast creates "holes in your suntan". This is most often noticed in the summer. The patches are usually located on the chest, back, pubis, neck and body folds. However, it may occur on any area of body. Mild itching and inflammation (redness or soreness) may be present. DIAGNOSIS  The diagnosisof this is made clinically (by looking). Cultures from samples are usually not needed. Examination under the microscope may help. However, yeast is normally found on skin. The diagnosis still remains clinical. Examination under Wood's Ultraviolet Light can determine the extent of the infection. TREATMENT  This common infection is usually only of cosmetic (only a concern to your appearance). It is easily treated with dandruff shampoo used during showers or bathing. Vigorous scrubbing will eliminate the yeast over several days time. The light areas in your skin may remain for weeks or months after the infection is cured unless your skin is exposed to sunlight. The lighter or darker spots caused by the fungus that remain after complete treatment are not a sign of treatment failure; it will take a long time to resolve. Your caregiver may recommend a number of commercial preparations or  medication by mouth if home care is not working. Recurrence is common and preventative medication may be necessary. This skin condition is not highly contagious. Special care is not needed to protect close friends and family members. Normal hygiene is usually enough. Follow up is required only if you develop complications (such as a secondary infection from scratching), if recommended by your caregiver, or if no relief is obtained from the preparations used. Document Released: 12/28/1999 Document Revised: 03/24/2011 Document Reviewed: 02/09/2008 [facility] Patient Information 2014 [place], [place].

## 2012-09-09 LAB — GC/CHLAMYDIA PROBE AMP
CT Probe RNA: NEGATIVE
GC Probe RNA: NEGATIVE

## 2012-10-04 ENCOUNTER — Telehealth: Payer: Self-pay

## 2012-10-04 NOTE — Telephone Encounter (Signed)
Pt is returning Barbara's Call if someone could call her back at [phone]

## 2012-10-04 NOTE — Telephone Encounter (Signed)
Pt dropped off form for RN network to be completed. 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 LMOM for pt to CB. If pt does not have documentation of TB test, she will need to RTC for this to be done to complete this section of form. Also, paractamol is for fever reduction if pt were to contract chikungunya, but is the same as acetaminophen. Pt can get OTC. Form is in [place] box.

## 2012-10-05 NOTE — Telephone Encounter (Signed)
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 She has had a chest xray here. She also states the paractamol has codeine in it, not just Tylenol. To you. We have filled out the form, but need you to advise on her request for paracetamol.

## 2012-10-05 NOTE — Telephone Encounter (Signed)
Paracetamol can be purchased in [place], but not the [place].  We have plain tylenol only.

## 2012-10-06 NOTE — Telephone Encounter (Signed)
Form signed.  Please scan into [facility].

## 2012-10-06 NOTE — Telephone Encounter (Signed)
Dr Milus Glazier did not sign her form, she had her physical on 09/08/12 will you review form and see if you will sign for him? He is out of the office until Sat. Patient needs form today it is in the provider sign off box.

## 2012-10-06 NOTE — Telephone Encounter (Signed)
Thank you. I have called again and advised her to use Tylenol if needed.

## 2012-10-20 ENCOUNTER — Encounter: Payer: Self-pay | Admitting: Family Medicine

## 2012-10-20 NOTE — Telephone Encounter (Signed)
Please advise patient has had 2 series of the hep B injections and titers still not showing immunity. She wants you to write letter stating it would not be beneficial for her to have another series.

## 2012-10-21 ENCOUNTER — Encounter: Payer: Self-pay | Admitting: Family Medicine

## 2012-10-21 ENCOUNTER — Telehealth: Payer: Self-pay

## 2012-10-21 NOTE — Telephone Encounter (Signed)
Patient advised letter ready for pick up 

## 2012-10-21 NOTE — Telephone Encounter (Signed)
Pt is checking on status of immunization records that she left for dr lauenstein last night to be completed

## 2012-11-18 ENCOUNTER — Other Ambulatory Visit: Payer: Self-pay

## 2013-01-13 HISTORY — PX: ABDOMINOPLASTY: SUR9

## 2013-03-14 ENCOUNTER — Ambulatory Visit (INDEPENDENT_AMBULATORY_CARE_PROVIDER_SITE_OTHER): Payer: No Typology Code available for payment source | Admitting: Family Medicine

## 2013-03-14 VITALS — BP 122/78 | HR 104 | Temp 98.1°F | Resp 18 | Ht 67.0 in | Wt 266.0 lb

## 2013-03-14 DIAGNOSIS — E669 Obesity, unspecified: Secondary | ICD-10-CM

## 2013-03-14 DIAGNOSIS — Z9889 Other specified postprocedural states: Secondary | ICD-10-CM

## 2013-03-14 DIAGNOSIS — D649 Anemia, unspecified: Secondary | ICD-10-CM

## 2013-03-14 LAB — POCT CBC
Granulocyte percent: 68.7 %G (ref 37–80)
HCT, POC: 40.3 % (ref 37.7–47.9)
Hemoglobin: 12.7 g/dL (ref 12.2–16.2)
Lymph, poc: 2.1 (ref 0.6–3.4)
MCH, POC: 29.7 pg (ref 27–31.2)
MCHC: 31.5 g/dL — AB (ref 31.8–35.4)
MCV: 94.1 fL (ref 80–97)
MID (cbc): 0.4 (ref 0–0.9)
MPV: 8.6 fL (ref 0–99.8)
POC Granulocyte: 5.5 (ref 2–6.9)
POC LYMPH PERCENT: 25.7 %L (ref 10–50)
POC MID %: 5.6 %M (ref 0–12)
Platelet Count, POC: 285 10*3/uL (ref 142–424)
RBC: 4.28 M/uL (ref 4.04–5.48)
RDW, POC: 14.6 %
WBC: 8 10*3/uL (ref 4.6–10.2)

## 2013-03-14 LAB — COMPREHENSIVE METABOLIC PANEL
ALT: 25 U/L (ref 0–35)
AST: 17 U/L (ref 0–37)
Albumin: 4 g/dL (ref 3.5–5.2)
Alkaline Phosphatase: 43 U/L (ref 39–117)
BUN: 13 mg/dL (ref 6–23)
CO2: 23 mEq/L (ref 19–32)
Calcium: 9.2 mg/dL (ref 8.4–10.5)
Chloride: 103 mEq/L (ref 96–112)
Creat: 0.63 mg/dL (ref 0.50–1.10)
Glucose, Bld: 111 mg/dL — ABNORMAL HIGH (ref 70–99)
Potassium: 4.3 mEq/L (ref 3.5–5.3)
Sodium: 138 mEq/L (ref 135–145)
Total Bilirubin: 0.3 mg/dL (ref 0.2–1.2)
Total Protein: 7.5 g/dL (ref 6.0–8.3)

## 2013-03-14 LAB — FERRITIN: Ferritin: 45 ng/mL (ref 10–291)

## 2013-03-14 MED ORDER — FERROUS SULFATE 325 (65 FE) MG PO TABS: 325.0000 mg | ORAL_TABLET | Freq: Every day | ORAL | Status: DC

## 2013-03-14 MED ORDER — PROMETHAZINE HCL 25 MG PO TABS: 25.0000 mg | ORAL_TABLET | Freq: Three times a day (TID) | ORAL | Status: DC | PRN

## 2013-03-14 MED ORDER — OXYCODONE-ACETAMINOPHEN 5-325 MG PO TABS: 1.0000 | ORAL_TABLET | Freq: Three times a day (TID) | ORAL | Status: DC | PRN

## 2013-03-14 NOTE — Progress Notes (Signed)
This is a 28 year old woman, looking for EPIC, who is anticipating having a tummy tuck [place] in [place]. She's concerned about her past anemia as well and wants to have her iron levels checked.  Patient tried Belviq but had serious nausea and vomiting issues  Objective: Patient has gained 16 pounds since her last visit.  HEENT: Unremarkable Chest: Normal respirations, abdomen: Mildly protuberant  Assessment: Obesity, iron deficiency anemia  Plan:Anemia - Plan: POCT CBC, Comprehensive metabolic panel, Ferritin, ferrous sulfate 325 (65 FE) MG tablet  Obesity, unspecified - Plan: POCT CBC, Comprehensive metabolic panel, Ferritin  History of abdominal surgery - Plan: promethazine (PHENERGAN) 25 MG tablet, oxyCODONE-acetaminophen (ROXICET) 5-325 MG per tablet  Signed, Elvina SidleKurt Ebonye Reade, MD

## 2013-03-16 ENCOUNTER — Telehealth: Payer: Self-pay

## 2013-03-16 NOTE — Telephone Encounter (Signed)
Addressed with pt under lab results.

## 2013-03-16 NOTE — Telephone Encounter (Signed)
Patient called to receive her lab results. She states she missed a call from us yesterday. Please return her call at [phone]. Thank you.

## 2013-03-18 ENCOUNTER — Encounter: Payer: Self-pay | Admitting: Family Medicine

## 2013-04-12 ENCOUNTER — Ambulatory Visit: Payer: No Typology Code available for payment source

## 2013-04-12 ENCOUNTER — Ambulatory Visit (INDEPENDENT_AMBULATORY_CARE_PROVIDER_SITE_OTHER): Payer: No Typology Code available for payment source | Admitting: Family Medicine

## 2013-04-12 VITALS — BP 98/76 | HR 93 | Temp 98.6°F | Resp 16 | Ht 67.0 in | Wt 265.0 lb

## 2013-04-12 DIAGNOSIS — D649 Anemia, unspecified: Secondary | ICD-10-CM

## 2013-04-12 DIAGNOSIS — Z0000 Encounter for general adult medical examination without abnormal findings: Secondary | ICD-10-CM

## 2013-04-12 DIAGNOSIS — IMO0001 Reserved for inherently not codable concepts without codable children: Secondary | ICD-10-CM

## 2013-04-12 DIAGNOSIS — R7611 Nonspecific reaction to tuberculin skin test without active tuberculosis: Secondary | ICD-10-CM

## 2013-04-12 DIAGNOSIS — Z01818 Encounter for other preprocedural examination: Secondary | ICD-10-CM

## 2013-04-12 LAB — POCT UA - MICROSCOPIC ONLY
Bacteria, U Microscopic: NEGATIVE
Casts, Ur, LPF, POC: NEGATIVE
Crystals, Ur, HPF, POC: NEGATIVE
Mucus, UA: NEGATIVE
RBC, urine, microscopic: NEGATIVE
WBC, Ur, HPF, POC: NEGATIVE
Yeast, UA: NEGATIVE

## 2013-04-12 LAB — POCT CBC
Granulocyte percent: 69.2 %G (ref 37–80)
HCT, POC: 42.8 % (ref 37.7–47.9)
Hemoglobin: 13.5 g/dL (ref 12.2–16.2)
Lymph, poc: 2.1 (ref 0.6–3.4)
MCH, POC: 29.3 pg (ref 27–31.2)
MCHC: 31.5 g/dL — AB (ref 31.8–35.4)
MCV: 93.1 fL (ref 80–97)
MID (cbc): 0.5 (ref 0–0.9)
MPV: 9.4 fL (ref 0–99.8)
POC Granulocyte: 5.8 (ref 2–6.9)
POC LYMPH PERCENT: 24.6 %L (ref 10–50)
POC MID %: 6.2 %M (ref 0–12)
Platelet Count, POC: 336 10*3/uL (ref 142–424)
RBC: 4.6 M/uL (ref 4.04–5.48)
RDW, POC: 15 %
WBC: 8.4 10*3/uL (ref 4.6–10.2)

## 2013-04-12 LAB — LIPID PANEL
Cholesterol: 151 mg/dL (ref 0–200)
HDL: 59 mg/dL (ref >39)
LDL Cholesterol: 76 mg/dL (ref 0–99)
Total CHOL/HDL Ratio: 2.6 Ratio
Triglycerides: 81 mg/dL (ref <150)
VLDL: 16 mg/dL (ref 0–40)

## 2013-04-12 LAB — COMPREHENSIVE METABOLIC PANEL
ALT: 26 U/L (ref 0–35)
AST: 22 U/L (ref 0–37)
Albumin: 4.1 g/dL (ref 3.5–5.2)
Alkaline Phosphatase: 45 U/L (ref 39–117)
BUN: 10 mg/dL (ref 6–23)
CO2: 26 mEq/L (ref 19–32)
Calcium: 9.2 mg/dL (ref 8.4–10.5)
Chloride: 102 mEq/L (ref 96–112)
Creat: 0.69 mg/dL (ref 0.50–1.10)
Glucose, Bld: 96 mg/dL (ref 70–99)
Potassium: 4.3 mEq/L (ref 3.5–5.3)
Sodium: 136 mEq/L (ref 135–145)
Total Bilirubin: 0.3 mg/dL (ref 0.2–1.2)
Total Protein: 7.5 g/dL (ref 6.0–8.3)

## 2013-04-12 LAB — POCT URINALYSIS DIPSTICK
Bilirubin, UA: NEGATIVE
Blood, UA: NEGATIVE
Glucose, UA: NEGATIVE
Ketones, UA: NEGATIVE
Leukocytes, UA: NEGATIVE
Nitrite, UA: NEGATIVE
Protein, UA: NEGATIVE
Spec Grav, UA: 1.02
Urobilinogen, UA: 0.2
pH, UA: 7

## 2013-04-12 LAB — FERRITIN: Ferritin: 87 ng/mL (ref 10–291)

## 2013-04-12 LAB — HIV ANTIBODY (ROUTINE TESTING W REFLEX): HIV: NONREACTIVE

## 2013-04-12 LAB — RPR

## 2013-04-12 MED ORDER — POLYSACCHARIDE IRON COMPLEX 150 MG PO CAPS: 150.0000 mg | ORAL_CAPSULE | Freq: Every day | ORAL | Status: DC

## 2013-04-12 MED ORDER — CYCLOBENZAPRINE HCL 5 MG PO TABS: 5.0000 mg | ORAL_TABLET | Freq: Three times a day (TID) | ORAL | Status: DC | PRN

## 2013-04-12 MED ORDER — PROMETHAZINE HCL 25 MG RE SUPP: 25.0000 mg | Freq: Four times a day (QID) | RECTAL | Status: DC | PRN

## 2013-04-12 NOTE — Patient Instructions (Addendum)
You are cleared for surgery There is no evidence for TB on Chest X-ray  Health Maintenance, Female A healthy lifestyle and preventative care can promote health and wellness.  Maintain regular health, dental, and eye exams.  Eat a healthy diet. Foods like vegetables, fruits, whole grains, low-fat dairy products, and lean protein foods contain the nutrients you need without too many calories. Decrease your intake of foods high in solid fats, added sugars, and salt. Get information about a proper diet from your caregiver, if necessary.  Regular physical exercise is one of the most important things you can do for your health. Most adults should get at least 150 minutes of moderate-intensity exercise (any activity that increases your heart rate and causes you to sweat) each week. In addition, most adults need muscle-strengthening exercises on 2 or more days a week.   Maintain a healthy weight. The body mass index (BMI) is a screening tool to identify possible weight problems. It provides an estimate of body fat based on height and weight. Your caregiver can help determine your BMI, and can help you achieve or maintain a healthy weight. For adults 20 years and older:  A BMI below 18.5 is considered underweight.  A BMI of 18.5 to 24.9 is normal.  A BMI of 25 to 29.9 is considered overweight.  A BMI of 30 and above is considered obese.  Maintain normal blood lipids and cholesterol by exercising and minimizing your intake of saturated fat. Eat a balanced diet with plenty of fruits and vegetables. Blood tests for lipids and cholesterol should begin at age 62 and be repeated every 5 years. If your lipid or cholesterol levels are high, you are over 50, or you are a high risk for heart disease, you may need your cholesterol levels checked more frequently.Ongoing high lipid and cholesterol levels should be treated with medicines if diet and exercise are not effective.  If you smoke, find out from your  caregiver how to quit. If you do not use tobacco, do not start.  Lung cancer screening is recommended for adults aged 7 80 years who are at high risk for developing lung cancer because of a history of smoking. Yearly low-dose computed tomography (CT) is recommended for people who have at least a 30-pack-year history of smoking and are a current smoker or have quit within the past 15 years. A pack year of smoking is smoking an average of 1 pack of cigarettes a day for 1 year (for example: 1 pack a day for 30 years or 2 packs a day for 15 years). Yearly screening should continue until the smoker has stopped smoking for at least 15 years. Yearly screening should also be stopped for people who develop a health problem that would prevent them from having lung cancer treatment.  If you are pregnant, do not drink alcohol. If you are breastfeeding, be very cautious about drinking alcohol. If you are not pregnant and choose to drink alcohol, do not exceed 1 drink per day. One drink is considered to be 12 ounces (355 mL) of beer, 5 ounces (148 mL) of wine, or 1.5 ounces (44 mL) of liquor.  Avoid use of street drugs. Do not share needles with anyone. Ask for help if you need support or instructions about stopping the use of drugs.  High blood pressure causes heart disease and increases the risk of stroke. Blood pressure should be checked at least every 1 to 2 years. Ongoing high blood pressure should be treated with  medicines, if weight loss and exercise are not effective.  If you are 79 to 29 years old, ask your caregiver if you should take aspirin to prevent strokes.  Diabetes screening involves taking a blood sample to check your fasting blood sugar level. This should be done once every 3 years, after age 57, if you are within normal weight and without risk factors for diabetes. Testing should be considered at a younger age or be carried out more frequently if you are overweight and have at least 1 risk factor  for diabetes.  Breast cancer screening is essential preventative care for women. You should practice "breast self-awareness." This means understanding the normal appearance and feel of your breasts and may include breast self-examination. Any changes detected, no matter how small, should be reported to a caregiver. Women in their 30s and 30s should have a clinical breast exam (CBE) by a caregiver as part of a regular health exam every 1 to 3 years. After age 70, women should have a CBE every year. Starting at age 24, women should consider having a mammogram (breast X-ray) every year. Women who have a family history of breast cancer should talk to their caregiver about genetic screening. Women at a high risk of breast cancer should talk to their caregiver about having an MRI and a mammogram every year.  Breast cancer gene (BRCA)-related cancer risk assessment is recommended for women who have family members with BRCA-related cancers. BRCA-related cancers include breast, ovarian, tubal, and peritoneal cancers. Having family members with these cancers may be associated with an increased risk for harmful changes (mutations) in the breast cancer genes BRCA1 and BRCA2. Results of the assessment will determine the need for genetic counseling and BRCA1 and BRCA2 testing.  The Pap test is a screening test for cervical cancer. Women should have a Pap test starting at age 54. Between ages 77 and 10, Pap tests should be repeated every 2 years. Beginning at age 66, you should have a Pap test every 3 years as long as the past 3 Pap tests have been normal. If you had a hysterectomy for a problem that was not cancer or a condition that could lead to cancer, then you no longer need Pap tests. If you are between ages 54 and 29, and you have had normal Pap tests going back 10 years, you no longer need Pap tests. If you have had past treatment for cervical cancer or a condition that could lead to cancer, you need Pap tests and  screening for cancer for at least 20 years after your treatment. If Pap tests have been discontinued, risk factors (such as a new sexual partner) need to be reassessed to determine if screening should be resumed. Some women have medical problems that increase the chance of getting cervical cancer. In these cases, your caregiver may recommend more frequent screening and Pap tests.  The human papillomavirus (HPV) test is an additional test that may be used for cervical cancer screening. The HPV test looks for the virus that can cause the cell changes on the cervix. The cells collected during the Pap test can be tested for HPV. The HPV test could be used to screen women aged 71 years and older, and should be used in women of any age who have unclear Pap test results. After the age of 40, women should have HPV testing at the same frequency as a Pap test.  Colorectal cancer can be detected and often prevented. Most routine colorectal cancer  screening begins at the age of 81 and continues through age 52. However, your caregiver may recommend screening at an earlier age if you have risk factors for colon cancer. On a yearly basis, your caregiver may provide home test kits to check for hidden blood in the stool. Use of a small camera at the end of a tube, to directly examine the colon (sigmoidoscopy or colonoscopy), can detect the earliest forms of colorectal cancer. Talk to your caregiver about this at age 47, when routine screening begins. Direct examination of the colon should be repeated every 5 to 10 years through age 17, unless early forms of pre-cancerous polyps or small growths are found.  Hepatitis C blood testing is recommended for all people born from [birth year] through 1966 and any individual with known risks for hepatitis C.  Practice safe sex. Use condoms and avoid high-risk sexual practices to reduce the spread of sexually transmitted infections (STIs). Sexually active women aged 33 and younger should be  checked for Chlamydia, which is a common sexually transmitted infection. Older women with new or multiple partners should also be tested for Chlamydia. Testing for other STIs is recommended if you are sexually active and at increased risk.  Osteoporosis is a disease in which the bones lose minerals and strength with aging. This can result in serious bone fractures. The risk of osteoporosis can be identified using a bone density scan. Women ages 70 and over and women at risk for fractures or osteoporosis should discuss screening with their caregivers. Ask your caregiver whether you should be taking a calcium supplement or vitamin D to reduce the rate of osteoporosis.  Menopause can be associated with physical symptoms and risks. Hormone replacement therapy is available to decrease symptoms and risks. You should talk to your caregiver about whether hormone replacement therapy is right for you.  Use sunscreen. Apply sunscreen liberally and repeatedly throughout the day. You should seek shade when your shadow is shorter than you. Protect yourself by wearing long sleeves, pants, a wide-brimmed hat, and sunglasses year round, whenever you are outdoors.  Notify your caregiver of new moles or changes in moles, especially if there is a change in shape or color. Also notify your caregiver if a mole is larger than the size of a pencil eraser.  Stay current with your immunizations. Document Released: 07/15/2010 Document Revised: 04/26/2012 Document Reviewed: 07/15/2010 [employer] Patient Information 2014 [place].

## 2013-04-12 NOTE — Progress Notes (Addendum)
This chart was scribed for Elvina Sidle, MD by Leone Payor, ED Scribe. This patient was seen in room 8 and the patient's care was started 12:48 PM.   Patient ID: Pamela Duarte MRN: [id number], DOB: Nov 05, 1985, 28 y.o. Date of Encounter: 04/12/2013, 12:47 PM  Primary Physician: Tally Due, MD  Chief Complaint: Pre-Op Physical (CPE)  HPI: 28 y.o. y/o female with history of noted below here for CPE.  Doing well. No issues/complaints. She is scheduled to have an upcoming tummy tuck surgery.   LMP: February 11th Pap: due next year MMG: N/A Review of Systems: Consitutional: No fever, chills, fatigue, night sweats, lymphadenopathy, or weight changes. Eyes: No visual changes, eye redness, or discharge. ENT/Mouth: Ears: No otalgia, tinnitus, hearing loss, discharge. Nose: No congestion, rhinorrhea, sinus pain, or epistaxis. Throat: No sore throat, post nasal drip, or teeth pain. Cardiovascular: No CP, palpitations, diaphoresis, DOE, edema, orthopnea, PND. Respiratory: No cough, hemoptysis, SOB, or wheezing. Gastrointestinal: No anorexia, dysphagia, reflux, pain, nausea, vomiting, hematemesis, diarrhea, constipation, BRBPR, or melena. Breast: No discharge, pain, swelling, or mass. Genitourinary: No dysuria, frequency, urgency, hematuria, incontinence, nocturia, amenorrhea, vaginal discharge, pruritis, burning, abnormal bleeding, or pain. Musculoskeletal: No decreased ROM, myalgias, stiffness, joint swelling, or weakness. Skin: No rash, erythema, lesion changes, pain, warmth, jaundice, or pruritis. Neurological: No headache, dizziness, syncope, seizures, tremors, memory loss, coordination problems, or paresthesias. Psychological: No anxiety, depression, hallucinations, SI/HI. Endocrine: No fatigue, polydipsia, polyphagia, polyuria, or known diabetes. All other systems were reviewed and are otherwise negative.  No past medical history on file.   No past surgical history on  file.  Home Meds:  Prior to Admission medications   Medication Sig Start Date End Date Taking? Authorizing Provider  ferrous sulfate 325 (65 FE) MG tablet Take 1 tablet (325 mg total) by mouth daily with breakfast. 03/14/13   Elvina Sidle, MD  oxyCODONE-acetaminophen (ROXICET) 5-325 MG per tablet Take 1 tablet by mouth every 8 (eight) hours as needed for severe pain. 03/14/13   Elvina Sidle, MD    Allergies:  Allergies  Allergen Reactions   Belviq [Lorcaserin Hcl] Nausea And Vomiting    History   Social History   Marital Status: Single    Spouse Name: N/A    Number of Children: N/A   Years of Education: N/A   Occupational History   Not on file.   Social History Main Topics   Smoking status: Never Smoker    Smokeless tobacco: Not on file   Alcohol Use: No   Drug Use: No   Sexual Activity: Yes   Other Topics Concern   Not on file   Social History Narrative   No narrative on file    Family History  Problem Relation Age of Onset   Diabetes Maternal Grandmother    Hypertension Maternal Grandmother    Diabetes Paternal Grandfather    Hypertension Paternal Grandfather    Cancer Paternal Grandfather    Hypertension Paternal Grandmother    Diabetes Paternal Grandmother    Hypertension Mother     Physical Exam: Blood pressure 98/76, pulse 93, temperature 98.6 F (37 C), temperature source Oral, resp. rate 16, height 5\' 7"  (1.702 m), weight 265 lb (120.203 kg), last menstrual period 02/23/2013, SpO2 98.00%., Body mass index is 41.5 kg/(m^2). General: Well developed, well nourished, in no acute distress. HEENT: Normocephalic, atraumatic. Conjunctiva pink, sclera non-icteric. Pupils 2 mm constricting to 1 mm, round, regular, and equally reactive to light and accomodation. EOMI. Internal auditory canal clear. TMs  with good cone of light and without pathology. Nasal mucosa pink. Nares are without discharge. No sinus tenderness. Oral mucosa pink. Dentition  normal. Pharynx without exudate.   Neck: Supple. Trachea midline. No thyromegaly. Full ROM. No lymphadenopathy. Lungs: Clear to auscultation bilaterally without wheezes, rales, or rhonchi. Breathing is of normal effort and unlabored. Cardiovascular: RRR with S1 S2. No murmurs, rubs, or gallops appreciated. Distal pulses 2+ symmetrically. No carotid or abdominal bruits. Abdomen: Soft, non-tender, non-distended with normoactive bowel sounds. No hepatosplenomegaly or masses. No rebound/guarding. No CVA tenderness. Without hernias.  Musculoskeletal: Full range of motion and 5/5 strength throughout. Without swelling, atrophy, tenderness, crepitus, or warmth. Extremities without clubbing, cyanosis, or edema. Calves supple. Skin: Warm and moist without erythema, ecchymosis, wounds, or rash. Neuro: A+Ox3. CN II-XII grossly intact. Moves all extremities spontaneously. Full sensation throughout. Normal gait. DTR 2+ throughout upper and lower extremities. Finger to nose intact. Psych:  Responds to questions appropriately with a normal affect.   Studies: CBC, CMET, Lipid pending Results for orders placed in visit on 04/12/13  POCT CBC      Result Value Ref Range   WBC 8.4  4.6 - 10.2 K/uL   Lymph, poc 2.1  0.6 - 3.4   POC LYMPH PERCENT 24.6  10 - 50 %L   MID (cbc) 0.5  0 - 0.9   POC MID % 6.2  0 - 12 %M   POC Granulocyte 5.8  2 - 6.9   Granulocyte percent 69.2  37 - 80 %G   RBC 4.60  4.04 - 5.48 M/uL   Hemoglobin 13.5  12.2 - 16.2 g/dL   HCT, POC 74.242.8  59.537.7 - 47.9 %   MCV 93.1  80 - 97 fL   MCH, POC 29.3  27 - 31.2 pg   MCHC 31.5 (*) 31.8 - 35.4 g/dL   RDW, POC 63.815.0     Platelet Count, POC 336  142 - 424 K/uL   MPV 9.4  0 - 99.8 fL  POCT URINALYSIS DIPSTICK      Result Value Ref Range   Color, UA yellow     Clarity, UA clear     Glucose, UA neg     Bilirubin, UA neg     Ketones, UA neg     Spec Grav, UA 1.020     Blood, UA neg     pH, UA 7.0     Protein, UA neg     Urobilinogen, UA 0.2      Nitrite, UA neg     Leukocytes, UA Negative    POCT UA - MICROSCOPIC ONLY      Result Value Ref Range   WBC, Ur, HPF, POC neg     RBC, urine, microscopic neg     Bacteria, U Microscopic neg     Mucus, UA neg     Epithelial cells, urine per micros 0-5     Crystals, Ur, HPF, POC neg     Casts, Ur, LPF, POC neg     Yeast, UA neg     [facility] reading (PRIMARY) by  Dr. Milus GlazierLauenstein:  CXR neg for evidence of TB.    Assessment/Plan:  28 y.o. 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

## 2013-04-29 ENCOUNTER — Other Ambulatory Visit: Payer: Self-pay | Admitting: Family Medicine

## 2013-04-29 DIAGNOSIS — Z9889 Other specified postprocedural states: Secondary | ICD-10-CM

## 2013-05-16 ENCOUNTER — Other Ambulatory Visit: Payer: Self-pay | Admitting: Family Medicine

## 2013-06-07 ENCOUNTER — Telehealth: Payer: Self-pay

## 2013-10-20 ENCOUNTER — Encounter: Payer: Self-pay | Admitting: Family Medicine

## 2013-11-03 ENCOUNTER — Ambulatory Visit (INDEPENDENT_AMBULATORY_CARE_PROVIDER_SITE_OTHER): Payer: No Typology Code available for payment source | Admitting: Family Medicine

## 2013-11-03 VITALS — BP 138/98 | HR 83 | Temp 99.0°F | Resp 18 | Ht 66.0 in | Wt 234.8 lb

## 2013-11-03 DIAGNOSIS — Z30011 Encounter for initial prescription of contraceptive pills: Secondary | ICD-10-CM

## 2013-11-03 DIAGNOSIS — J0101 Acute recurrent maxillary sinusitis: Secondary | ICD-10-CM

## 2013-11-03 MED ORDER — NORETHINDRONE ACET-ETHINYL EST 1-20 MG-MCG PO TABS: 1.0000 | ORAL_TABLET | Freq: Every day | ORAL | Status: DC

## 2013-11-03 MED ORDER — AZITHROMYCIN 250 MG PO TABS: ORAL_TABLET | ORAL | Status: DC

## 2013-11-03 NOTE — Patient Instructions (Signed)
Oral Contraception Use Oral contraceptive pills (OCPs) are medicines taken to prevent pregnancy. OCPs work by preventing the ovaries from releasing eggs. The hormones in OCPs also cause the cervical mucus to thicken, preventing the sperm from entering the uterus. The hormones also cause the uterine lining to become thin, not allowing a fertilized egg to attach to the inside of the uterus. OCPs are highly effective when taken exactly as prescribed. However, OCPs do not prevent sexually transmitted diseases (STDs). Safe sex practices, such as using condoms along with an OCP, can help prevent STDs. Before taking OCPs, you may have a physical exam and Pap test. Your health care provider may also order blood tests if necessary. Your health care provider will make sure you are a good candidate for oral contraception. Discuss with your health care provider the possible side effects of the OCP you may be prescribed. When starting an OCP, it can take 2 to 3 months for the body to adjust to the changes in hormone levels in your body.  HOW TO TAKE ORAL CONTRACEPTIVE PILLS Your health care provider may advise you on how to start taking the first cycle of OCPs. Otherwise, you can:   Start on day 1 of your menstrual period. You will not need any backup contraceptive protection with this start time.   Start on the first Sunday after your menstrual period or the day you get your prescription. In these cases, you will need to use backup contraceptive protection for the first week.   Start the pill at any time of your cycle. If you take the pill within 5 days of the start of your period, you are protected against pregnancy right away. In this case, you will not need a backup form of birth control. If you start at any other time of your menstrual cycle, you will need to use another form of birth control for 7 days. If your OCP is the type called a minipill, it will protect you from pregnancy after taking it for 2 days (48  hours). After you have started taking OCPs:   If you forget to take 1 pill, take it as soon as you remember. Take the next pill at the regular time.   If you miss 2 or more pills, call your health care provider because different pills have different instructions for missed doses. Use backup birth control until your next menstrual period starts.   If you use a 28-day pack that contains inactive pills and you miss 1 of the last 7 pills (pills with no hormones), it will not matter. Throw away the rest of the non-hormone pills and start a new pill pack.  No matter which day you start the OCP, you will always start a new pack on that same day of the week. Have an extra pack of OCPs and a backup contraceptive method available in case you miss some pills or lose your OCP pack.  HOME CARE INSTRUCTIONS   Do not smoke.   Always use a condom to protect against STDs. OCPs do not protect against STDs.   Use a calendar to mark your menstrual period days.   Read the information and directions that came with your OCP. Talk to your health care provider if you have questions.  SEEK MEDICAL CARE IF:   You develop nausea and vomiting.   You have abnormal vaginal discharge or bleeding.   You develop a rash.   You miss your menstrual period.   You are losing   your hair.   You need treatment for mood swings or depression.   You get dizzy when taking the OCP.   You develop acne from taking the OCP.   You become pregnant.  SEEK IMMEDIATE MEDICAL CARE IF:   You develop chest pain.   You develop shortness of breath.   You have an uncontrolled or severe headache.   You develop numbness or slurred speech.   You develop visual problems.   You develop pain, redness, and swelling in the legs.  Document Released: 12/19/2010 Document Revised: 05/16/2013 Document Reviewed: 06/20/2012 [employer] Patient Information 2015 [employer]. This information is not intended to replace  advice given to you by your health care provider. Make sure you discuss any questions you have with your health care provider.  

## 2013-11-03 NOTE — Progress Notes (Signed)
This chart was scribed for Pamela SidleKurt Marquitta Persichetti, MD by Tonye RoyaltyJoshua Chen, ED Scribe. This patient was seen in room 12 and the patient's care was started at 10:29 AM.   Patient ID: Pamela Duarte MRN: [id number], DOB: 1985-06-28, 28 y.o. Date of Encounter: 11/03/2013, 10:19 AM  Primary Physician: Tally DueGUEST, CHRIS WARREN, MD  Chief Complaint: congestion  HPI: 28 y.o. year old female with history below presents with sore throat, nasal congestion, and drainage. She states she initially improved, then got worse again. She denies significant cough. She states she is going back to work [employer] and does not want to take off because she is on contract.   She also requests contraception. She states she has used Sprintec in the past. She states she would like to stop having periods and also has considered low dose estrogen for weight loss. Repeat blood pressure is 110/72.   She states she had tummy tuck surgery for weight loss in the [place] after the last time she was here and has recovered well, though she states she has to exercise in moderation. She reports using Belviq previously without success. She requests weight loss medication.   She states she works night shifts and sleeps during the day, and prefers this work schedule. She uses caffeine pills to stay awake.   No past medical history on file.   Home Meds: Prior to Admission medications   Medication Sig Start Date End Date Taking? Authorizing Provider  iron polysaccharides (NIFEREX) 150 MG capsule Take 1 capsule (150 mg total) by mouth daily. 04/12/13  Yes Pamela SidleKurt Kendan Cornforth, MD  oxyCODONE-acetaminophen (ROXICET) 5-325 MG per tablet Take 1 tablet by mouth every 8 (eight) hours as needed for severe pain. 03/14/13  Yes Pamela SidleKurt Suda Forbess, MD  promethazine (PHENERGAN) 25 MG suppository Place 1 suppository (25 mg total) rectally every 6 (six) hours as needed for nausea or vomiting. 04/12/13  Yes Pamela SidleKurt Rhylee Pucillo, MD  ferrous sulfate 325 (65 FE) MG tablet  Take 1 tablet (325 mg total) by mouth daily with breakfast. 03/14/13   Pamela SidleKurt Alantis Bethune, MD    Allergies:  Allergies  Allergen Reactions  . Belviq [Lorcaserin Hcl] Nausea And Vomiting    History   Social History  . Marital Status: Single    Spouse Name: N/A    Number of Children: N/A  . Years of Education: N/A   Occupational History  . Not on file.   Social History Main Topics  . Smoking status: Never Smoker   . Smokeless tobacco: Not on file  . Alcohol Use: No  . Drug Use: No  . Sexual Activity: Yes   Other Topics Concern  . Not on file   Social History Narrative  . No narrative on file     Review of Systems: Constitutional: negative for chills, fever, night sweats, weight changes, or fatigue  HEENT: negative for vision changes, hearing loss, ST, epistaxis, or sinus pressure. Positive for nasal congestion, drainage, and sore throat Cardiovascular: negative for chest pain or palpitations Respiratory: negative for hemoptysis, wheezing, shortness of breath, or cough Abdominal: negative for abdominal pain, nausea, vomiting, diarrhea, or constipation Dermatological: negative for rash Neurologic: negative for headache, dizziness, or syncope All other systems reviewed and are otherwise negative with the exception to those above and in the HPI.   Physical Exam: Blood pressure 138/98, pulse 83, temperature 99 F (37.2 C), temperature source Oral, resp. rate 18, height 5\' 6"  (1.676 m), weight 234 lb 12.8 oz (106.505 kg), SpO2 98.00%., Body mass  index is 37.92 kg/(m^2). General: Well developed, well nourished, in no acute distress. Head: Normocephalic, atraumatic, eyes without discharge, sclera non-icteric, nares are without discharge. Bilateral auditory canals clear, TM's are without perforation, pearly grey and translucent with reflective cone of light bilaterally. Oral cavity moist, posterior pharynx without exudate, erythema, peritonsillar abscess, or post nasal drip.  Neck:  Supple. No thyromegaly. Full ROM. No lymphadenopathy. Lungs: Clear bilaterally to auscultation without wheezes, rales, or rhonchi. Breathing is unlabored. Heart: RRR with S1 S2. No murmurs, rubs, or gallops appreciated. Abdomen: Soft, non-tender, non-distended with normoactive bowel sounds. No hepatomegaly. No rebound/guarding. No obvious abdominal masses.  Excellent result from tummy tuck operation Msk:  Strength and tone normal for age. Extremities/Skin: Warm and dry. No clubbing or cyanosis. No edema. No rashes or suspicious lesions. Neuro: Alert and oriented X 3. Moves all extremities spontaneously. Gait is normal. CNII-XII grossly in tact. Psych:  Responds to questions appropriately with a normal affect.    ASSESSMENT AND PLAN:  28 y.o. year old female with Acute recurrent maxillary sinusitis - Plan: azithromycin (ZITHROMAX Z-PAK) 250 MG tablet  Encounter for initial prescription of contraceptive pills - Plan: norethindrone-ethinyl estradiol (LOESTRIN 1/20, 21,) 1-20 MG-MCG tablet     Signed, Pamela SidleKurt Drey Shaff, MD 11/03/2013 10:19 AM

## 2014-04-12 ENCOUNTER — Ambulatory Visit (INDEPENDENT_AMBULATORY_CARE_PROVIDER_SITE_OTHER): Payer: No Typology Code available for payment source

## 2014-04-12 ENCOUNTER — Ambulatory Visit (INDEPENDENT_AMBULATORY_CARE_PROVIDER_SITE_OTHER): Payer: No Typology Code available for payment source | Admitting: Family Medicine

## 2014-04-12 VITALS — BP 120/82 | HR 92 | Temp 98.1°F | Resp 18 | Ht 66.5 in | Wt 256.2 lb

## 2014-04-12 DIAGNOSIS — Z202 Contact with and (suspected) exposure to infections with a predominantly sexual mode of transmission: Secondary | ICD-10-CM

## 2014-04-12 DIAGNOSIS — Z01818 Encounter for other preprocedural examination: Secondary | ICD-10-CM

## 2014-04-12 DIAGNOSIS — R7611 Nonspecific reaction to tuberculin skin test without active tuberculosis: Secondary | ICD-10-CM

## 2014-04-12 DIAGNOSIS — Z0000 Encounter for general adult medical examination without abnormal findings: Secondary | ICD-10-CM

## 2014-04-12 DIAGNOSIS — L299 Pruritus, unspecified: Secondary | ICD-10-CM

## 2014-04-12 DIAGNOSIS — Z9889 Other specified postprocedural states: Secondary | ICD-10-CM

## 2014-04-12 DIAGNOSIS — Z862 Personal history of diseases of the blood and blood-forming organs and certain disorders involving the immune mechanism: Secondary | ICD-10-CM | POA: Diagnosis not present

## 2014-04-12 LAB — POCT CBC
Granulocyte percent: 63.4 %G (ref 37–80)
HCT, POC: 39.6 % (ref 37.7–47.9)
Hemoglobin: 12.7 g/dL (ref 12.2–16.2)
Lymph, poc: 1.6 (ref 0.6–3.4)
MCH, POC: 28.9 pg (ref 27–31.2)
MCHC: 32.2 g/dL (ref 31.8–35.4)
MCV: 89.8 fL (ref 80–97)
MID (cbc): 0.3 (ref 0–0.9)
MPV: 7.5 fL (ref 0–99.8)
POC Granulocyte: 3.3 (ref 2–6.9)
POC LYMPH PERCENT: 30.3 %L (ref 10–50)
POC MID %: 6.3 %M (ref 0–12)
Platelet Count, POC: 310 10*3/uL (ref 142–424)
RBC: 4.41 M/uL (ref 4.04–5.48)
RDW, POC: 14.7 %
WBC: 5.2 10*3/uL (ref 4.6–10.2)

## 2014-04-12 LAB — LIPID PANEL
Cholesterol: 144 mg/dL (ref 0–200)
HDL: 59 mg/dL (ref >=46)
LDL Cholesterol: 75 mg/dL (ref 0–99)
Total CHOL/HDL Ratio: 2.4 Ratio
Triglycerides: 50 mg/dL (ref <150)
VLDL: 10 mg/dL (ref 0–40)

## 2014-04-12 LAB — POCT URINALYSIS DIPSTICK
Bilirubin, UA: NEGATIVE
Blood, UA: NEGATIVE
Glucose, UA: NEGATIVE
Ketones, UA: NEGATIVE
Leukocytes, UA: NEGATIVE
Nitrite, UA: NEGATIVE
Protein, UA: NEGATIVE
Spec Grav, UA: 1.02
Urobilinogen, UA: 0.2
pH, UA: 6

## 2014-04-12 LAB — COMPREHENSIVE METABOLIC PANEL
ALT: 22 U/L (ref 0–35)
AST: 19 U/L (ref 0–37)
Albumin: 4.1 g/dL (ref 3.5–5.2)
Alkaline Phosphatase: 48 U/L (ref 39–117)
BUN: 12 mg/dL (ref 6–23)
CO2: 26 mEq/L (ref 19–32)
Calcium: 9.3 mg/dL (ref 8.4–10.5)
Chloride: 104 mEq/L (ref 96–112)
Creat: 0.64 mg/dL (ref 0.50–1.10)
Glucose, Bld: 93 mg/dL (ref 70–99)
Potassium: 4 mEq/L (ref 3.5–5.3)
Sodium: 141 mEq/L (ref 135–145)
Total Bilirubin: 0.3 mg/dL (ref 0.2–1.2)
Total Protein: 7.6 g/dL (ref 6.0–8.3)

## 2014-04-12 LAB — IRON AND TIBC
%SAT: 10 % — ABNORMAL LOW (ref 20–55)
Iron: 41 ug/dL — ABNORMAL LOW (ref 42–145)
TIBC: 410 ug/dL (ref 250–470)
UIBC: 369 ug/dL (ref 125–400)

## 2014-04-12 LAB — RPR

## 2014-04-12 LAB — FERRITIN: Ferritin: 43 ng/mL (ref 10–291)

## 2014-04-12 LAB — HIV ANTIBODY (ROUTINE TESTING W REFLEX): HIV 1&2 Ab, 4th Generation: NONREACTIVE

## 2014-04-12 MED ORDER — CYCLOBENZAPRINE HCL 10 MG PO TABS: 10.0000 mg | ORAL_TABLET | Freq: Three times a day (TID) | ORAL | Status: DC | PRN

## 2014-04-12 MED ORDER — LORATADINE 10 MG PO TABS: 10.0000 mg | ORAL_TABLET | Freq: Every day | ORAL | Status: DC

## 2014-04-12 MED ORDER — PROMETHAZINE HCL 25 MG RE SUPP: 25.0000 mg | Freq: Four times a day (QID) | RECTAL | Status: DC | PRN

## 2014-04-12 MED ORDER — OXYCODONE-ACETAMINOPHEN 5-325 MG PO TABS: 1.0000 | ORAL_TABLET | Freq: Three times a day (TID) | ORAL | Status: DC | PRN

## 2014-04-12 NOTE — Addendum Note (Signed)
Addended by: Felix AhmadiFRANSEN, Roland Lipke A on: 04/12/2014 11:09 AM   Modules accepted: Orders

## 2014-04-12 NOTE — Addendum Note (Signed)
Addended by: Johnnette LitterARDWELL, Bradrick Kamau M on: 04/12/2014 11:12 AM   Modules accepted: Kipp BroodSmartSet

## 2014-04-12 NOTE — Patient Instructions (Signed)

## 2014-04-12 NOTE — Progress Notes (Signed)
Patient ID: Pamela HarmanShakiera M Duarte MRN: [id number], DOB: 1985/10/27, 29 y.o. Date of Encounter: 04/12/2014, 11:05 AM  Primary Physician: Tally DueGUEST, CHRIS WARREN, MD  Chief Complaint: Physical (CPE)  HPI: 29 y.o. y/o female with history of noted below here for CPE.  Doing well. No issues/complaints. Patient is planning some plastic surgery to be done in the [place].  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   Review of Systems: Consitutional: No fever, chills, fatigue, night sweats, lymphadenopathy, or weight changes. Eyes: No visual changes, eye redness, or discharge. ENT/Mouth: Ears: No otalgia, tinnitus, hearing loss, discharge. Nose: No congestion, rhinorrhea, sinus pain, or epistaxis. Throat: No sore throat, post nasal drip, or teeth pain. Cardiovascular: No CP, palpitations, diaphoresis, DOE, edema, orthopnea, PND. Respiratory: No cough, hemoptysis, SOB, or wheezing. Gastrointestinal: No anorexia, dysphagia, reflux, pain, nausea, vomiting, hematemesis, diarrhea, constipation, BRBPR, or melena. Breast: No discharge, pain, swelling, or mass. Genitourinary: No dysuria, frequency, urgency, hematuria, incontinence, nocturia, amenorrhea, vaginal discharge, pruritis, burning, abnormal bleeding, or pain. Musculoskeletal: No decreased ROM, myalgias, stiffness, joint swelling, or weakness. Skin: No rash, erythema, lesion changes, pain, warmth, jaundice, or pruritis. Neurological: No headache, dizziness, syncope, seizures, tremors, memory loss, coordination problems, or paresthesias. Psychological: No anxiety, depression, hallucinations, SI/HI. Endocrine: No fatigue, polydipsia, polyphagia, polyuria, or known diabetes. All other systems were reviewed and are otherwise negative.  History reviewed. No pertinent past medical history.   Past Surgical History  Procedure Laterality Date  . Abdominoplasty  2015    Home Meds:  Prior to  Admission medications   Medication Sig Start Date End Date Taking? Authorizing Provider  azithromycin (ZITHROMAX Z-PAK) 250 MG tablet Take as directed on pack Patient not taking: Reported on 04/12/2014 11/03/13   Elvina SidleKurt Baby Gieger, MD  iron polysaccharides (NIFEREX) 150 MG capsule Take 1 capsule (150 mg total) by mouth daily. Patient not taking: Reported on 04/12/2014 04/12/13   Elvina SidleKurt Emerald Shor, MD  norethindrone-ethinyl estradiol (LOESTRIN 1/20, 21,) 1-20 MG-MCG tablet Take 1 tablet by mouth daily. Patient not taking: Reported on 04/12/2014 11/03/13   Elvina SidleKurt Tanveer Dobberstein, MD  oxyCODONE-acetaminophen (ROXICET) 5-325 MG per tablet Take 1 tablet by mouth every 8 (eight) hours as needed for severe pain. 04/12/14   Elvina SidleKurt Jaycelyn Orrison, MD  promethazine (PHENERGAN) 25 MG suppository Place 1 suppository (25 mg total) rectally every 6 (six) hours as needed for nausea or vomiting. 04/12/14   Elvina SidleKurt Therasa Lorenzi, MD    Allergies:  Allergies  Allergen Reactions  . Belviq [Lorcaserin Hcl] Nausea And Vomiting    History   Social History  . Marital Status: Single    Spouse Name: N/A  . Number of Children: N/A  . Years of Education: N/A   Occupational History  . Not on file.   Social History Main Topics  . Smoking status: Never Smoker   . Smokeless tobacco: Not on file  . Alcohol Use: No  . Drug Use: No  . Sexual Activity: Yes   Other Topics Concern  . Not on file   Social History Narrative    Family History  Problem Relation Age of Onset  . Diabetes Maternal Grandmother   . Hypertension Maternal Grandmother   . Diabetes Paternal Grandfather   . Hypertension Paternal Grandfather   . Cancer Paternal Grandfather   . Hypertension Paternal Grandmother   . Diabetes Paternal Grandmother   . Hypertension Mother     Physical Exam: Blood pressure 120/82, pulse 92, temperature 98.1 F (36.7 C),  resp. rate 18, height 5' 6.5" (1.689 m), weight 256 lb 3.2 oz (116.212 kg), last menstrual period 03/30/2014,  SpO2 98 %., Body mass index is 40.74 kg/(m^2). Wt Readings from Last 3 Encounters:  04/12/14 256 lb 3.2 oz (116.212 kg)  11/03/13 234 lb 12.8 oz (106.505 kg)  04/12/13 265 lb (120.203 kg)   BP Readings from Last 3 Encounters:  04/12/14 120/82  11/03/13 138/98  04/12/13 98/76   General: Well developed, well nourished, in no acute distress. HEENT: Normocephalic, atraumatic. Conjunctiva pink, sclera non-icteric. Pupils 2 mm constricting to 1 mm, round, regular, and equally reactive to light and accomodation. EOMI. Fundi benign   Internal auditory canal clear. TMs with good cone of light and without pathology. Nasal mucosa pink. Nares are without discharge. No sinus tenderness. Oral mucosa pink. Dentition excellent. Pharynx without exudate.    Neck: Supple. Trachea midline. No thyromegaly. Full ROM. No lymphadenopathy. Lungs: Clear to auscultation bilaterally without wheezes, rales, or rhonchi. Breathing is of normal effort and unlabored. Cardiovascular: RRR with S1 S2. No murmurs, rubs, or gallops appreciated. Distal pulses 2+ symmetrically. No carotid or abdominal bruits. Breast: Symmetrical. No masses. Nipples without discharge. Abdomen: Soft, non-tender, non-distended with normoactive bowel sounds. No hepatosplenomegaly.  She has fullness in the epigastrium where she's been told she has a seroma. No rebound/guarding. No CVA tenderness. Without hernias. Patient has a well-healed prominent surgical bikini scar Musculoskeletal: Full range of motion and 5/5 strength throughout. Without swelling, atrophy, tenderness, crepitus, or warmth. Extremities without clubbing, cyanosis, or edema. Calves supple. Skin: Warm and moist without erythema, ecchymosis, wounds, or rash. Neuro: A+Ox3. CN II-XII grossly intact. Moves all extremities spontaneously. Full sensation throughout. Normal gait. DTR 2+ throughout upper and lower extremities. Finger to nose intact. Psych:  Responds to questions appropriately  with a normal affect.   Lab Results  Component Value Date   CHOL 151 04/12/2013   HDL 59 04/12/2013   LDLCALC 76 04/12/2013   TRIG 81 04/12/2013   CHOLHDL 2.6 04/12/2013   [facility] reading (PRIMARY) by  Dr. Milus Glazier: NAD   Assessment/Plan:  29 y.o. y/o female here for CPE   ICD-9-CM ICD-10-CM   1. Annual physical exam V70.0 Z00.00 POCT CBC     POCT urinalysis dipstick     Comprehensive metabolic panel  2. 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 Preop examination V72.84 Z01.818 promethazine (PHENERGAN) 25 MG suppository     cyclobenzaprine (FLEXERIL) 10 MG tablet  5. History of abdominal surgery V45.89 Z98.89 oxyCODONE-acetaminophen (ROXICET) 5-325 MG per tablet  6. Pruritus 698.9 L29.9 loratadine (CLARITIN) 10 MG tablet   -  Signed, Elvina Sidle, MD 04/12/2014 11:05 AM

## 2014-04-12 NOTE — Addendum Note (Signed)
Addended by: Felix AhmadiFRANSEN, Hollyann Pablo A on: 04/12/2014 11:13 AM   Modules accepted: Orders

## 2014-04-13 ENCOUNTER — Encounter: Payer: Self-pay | Admitting: Radiology

## 2014-04-18 ENCOUNTER — Telehealth: Payer: Self-pay

## 2014-04-18 ENCOUNTER — Encounter: Payer: Self-pay | Admitting: Family Medicine

## 2014-04-18 NOTE — Telephone Encounter (Signed)
Can we write pt for phenergan PO?

## 2014-04-18 NOTE — Telephone Encounter (Signed)
Pt states the PHENERGAN 25 mg PR doesn't work for her and she would like to have it PO instead. Please call [phone]       [facility] [address]

## 2014-04-19 ENCOUNTER — Other Ambulatory Visit: Payer: Self-pay | Admitting: Family Medicine

## 2014-04-19 MED ORDER — PROMETHAZINE HCL 25 MG PO TABS: 25.0000 mg | ORAL_TABLET | Freq: Three times a day (TID) | ORAL | Status: DC | PRN

## 2014-04-19 NOTE — Telephone Encounter (Signed)
Filled 20 oral tabs.

## 2014-04-19 NOTE — Telephone Encounter (Signed)
Pt.notified

## 2014-08-31 IMAGING — CT CT ABD-PELV W/ CM
1 of 2 series · 15 of 32 positions shown, 19 images · IV contrast (OMNIPAQUE 300)
Comparison: None.

CLINICAL DATA: Right sided abdominal pain

CT ABDOMEN AND PELVIS WITH CONTRAST
TECHNIQUE: Multidetector CT imaging of the abdomen and pelvis was
performed following the standard protocol during bolus
administration of intravenous contrast.
Contrast: 100mL OMNIPAQUE IOHEXOL 300 MG/ML  SOLN

[Series 2: abd/pel with · axial · 0.88mm/px · z∈[-562,-132]mm · 15 of 96 slices shown, 19 images]
[im 5/96  soft-tissue]
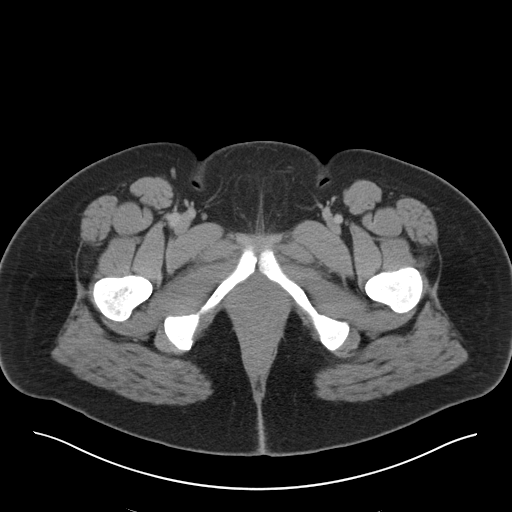
[im 5/96  bone]
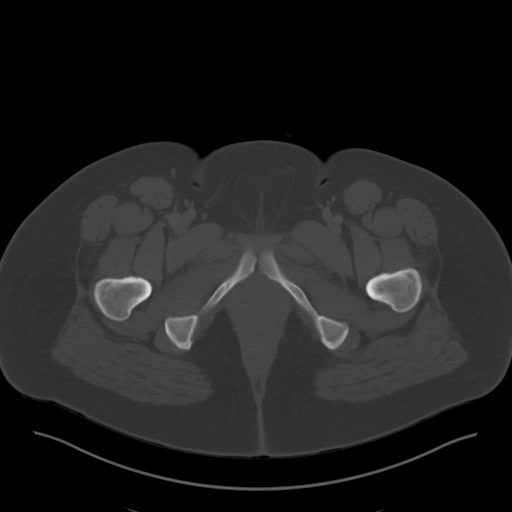
[im 13/96  soft-tissue]
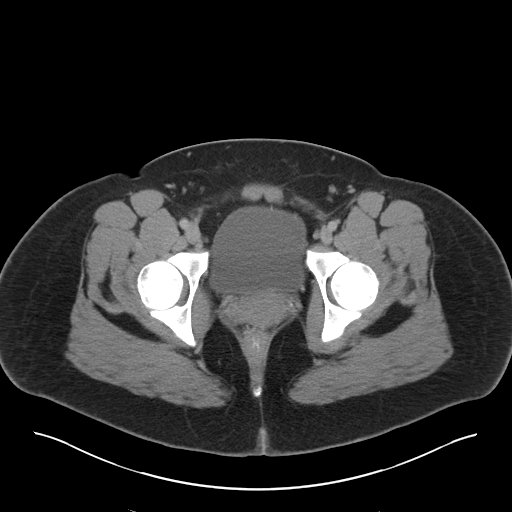
[im 21/96  soft-tissue]
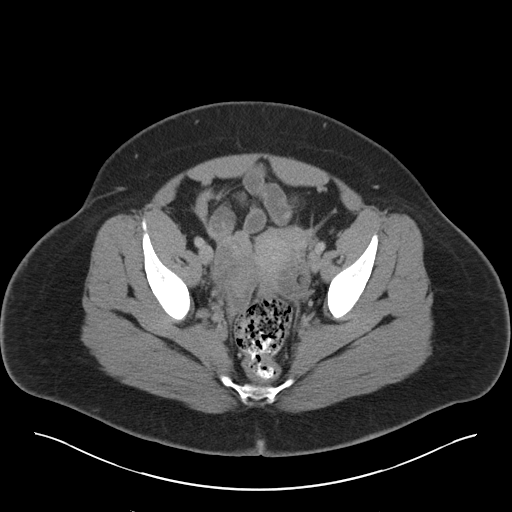
[im 25/96  soft-tissue]
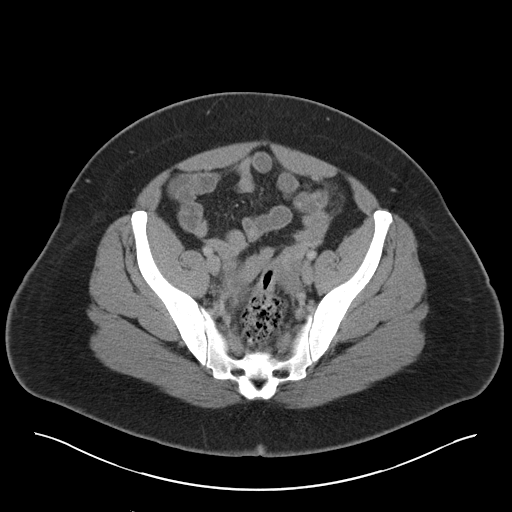
[im 34/96  soft-tissue]
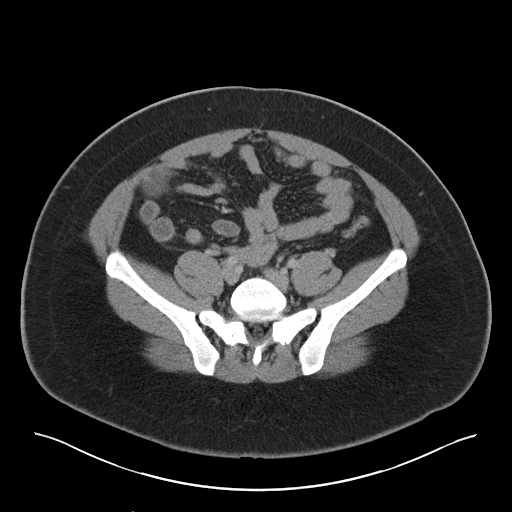
[im 42/96  soft-tissue]
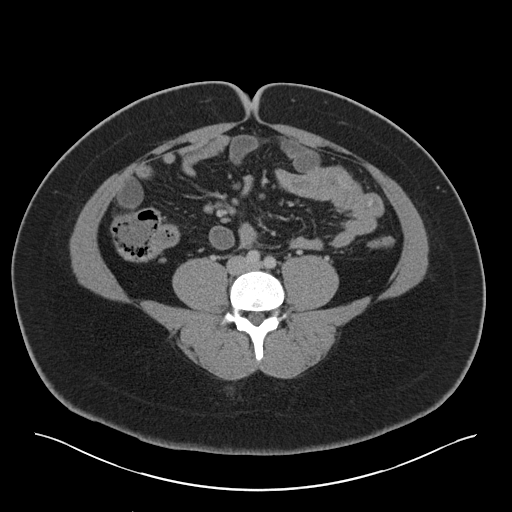
[im 50/96  soft-tissue]
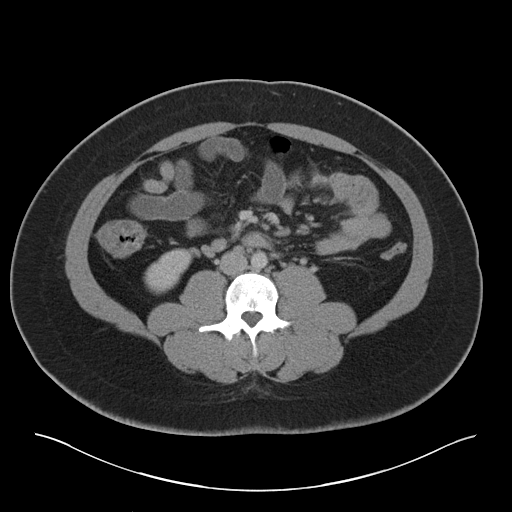
[im 54/96  soft-tissue]
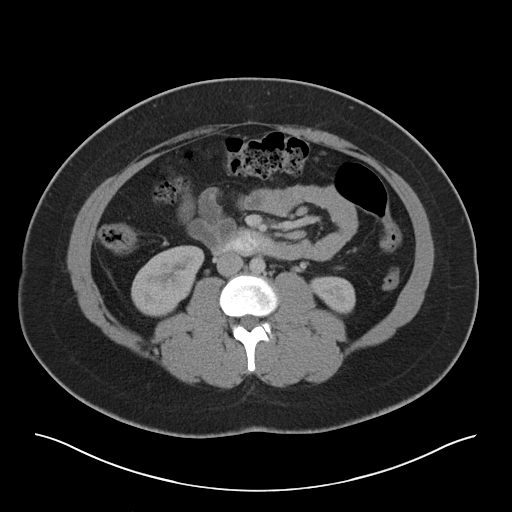
[im 62/96  soft-tissue]
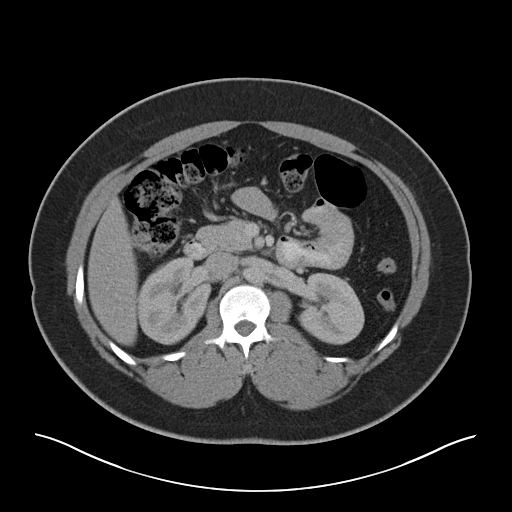
[im 62/96  bone]
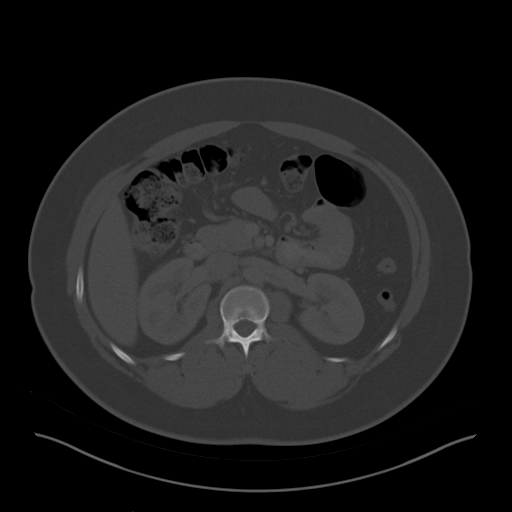
[im 71/96  soft-tissue]
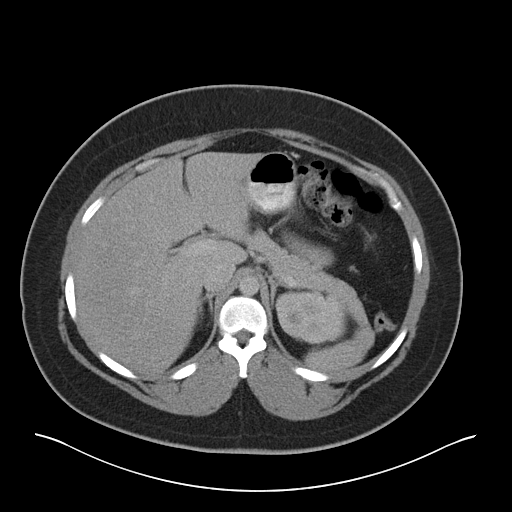
[im 75/96  soft-tissue]
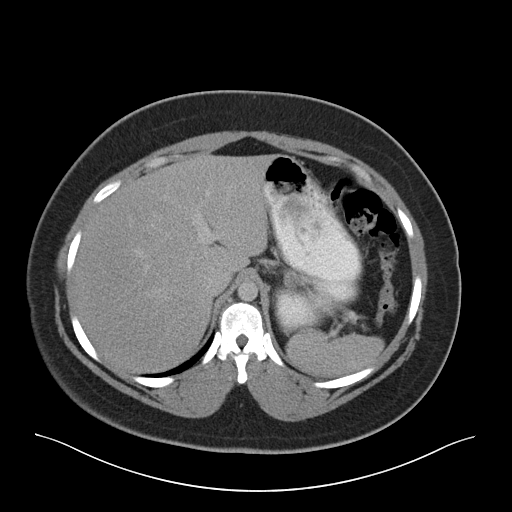
[im 79/96  lung]
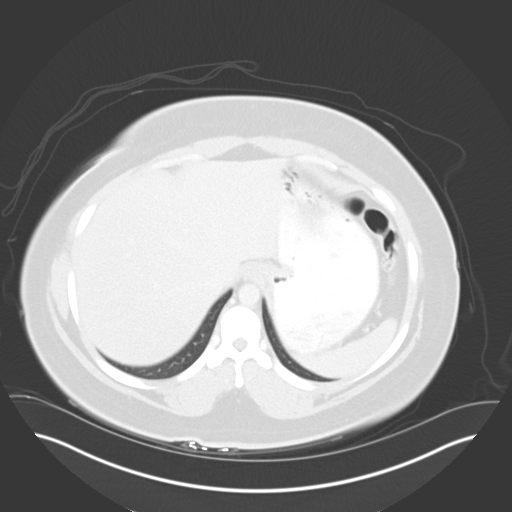
[im 83/96  soft-tissue]
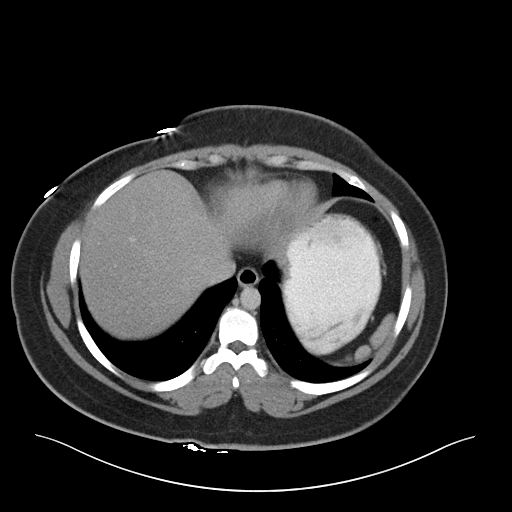
[im 83/96  lung]
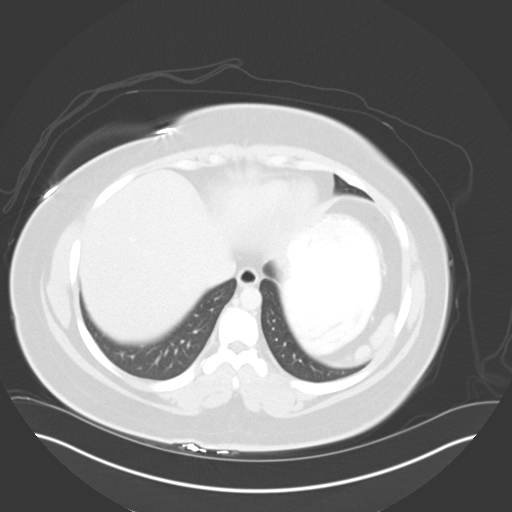
[im 87/96  lung]
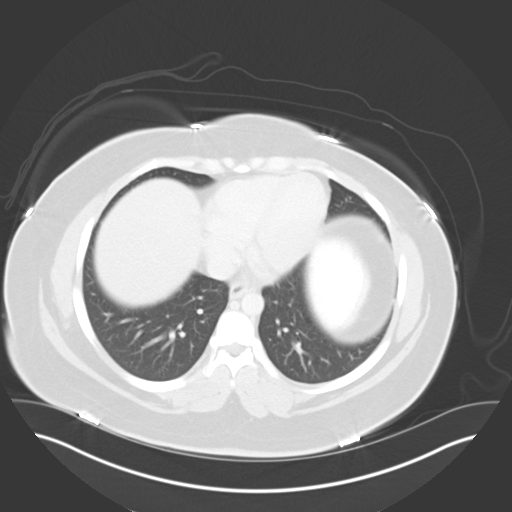
[im 91/96  soft-tissue]
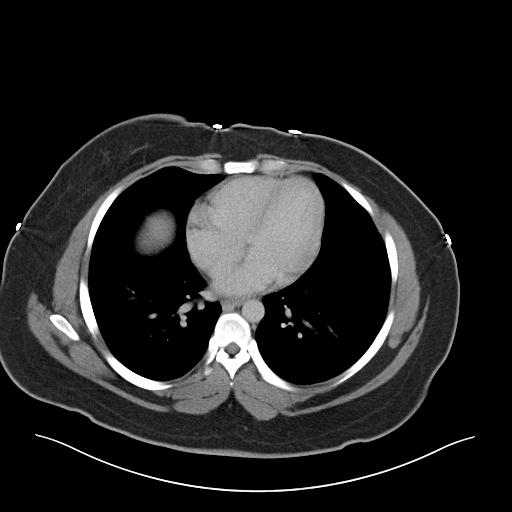
[im 91/96  lung]
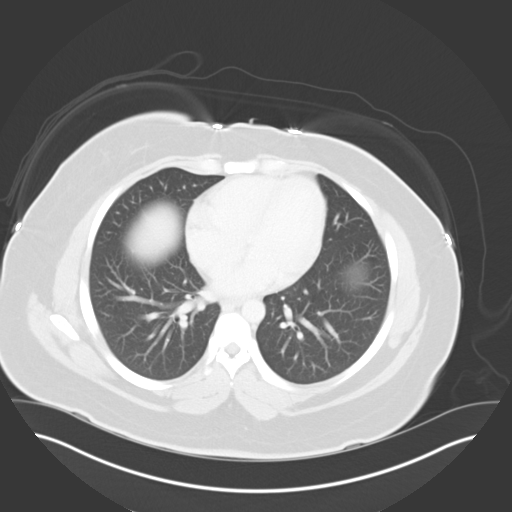

[15 of 32 positions shown; findings below may reference images not displayed]

FINDINGS: Normal appendix.  No disproportionate dilatation of bowel
to suggest obstruction.

Innumerable small mesenteric lymph nodes are present.  They are
primarily located within the central small bowel mesentery.
Multiple small retroperitoneal lymph nodes are also noted.  None
are greater than 1 cm short axis diameter.

There is a small amount of free fluid layering in the pelvis.
There is an enhancing rim of tissues surrounding the fluid.  Is
also a noticeable stranding within the peritoneal fat of the left
lower quadrant and left hemi pelvis.  No focal bowel wall
thickening.  No extraluminal bowel gas.  Uterus is unremarkable.
Cystic changes of the right ovary are present.

Liver, gallbladder, spleen, pancreas, adrenal glands, kidneys are
within normal limits.

No acute bony deformity.
IMPRESSION: Normal appendix.

There are inflammatory changes within the fat of the left lower
quadrant and left hemi pelvis without associated bowel wall
thickening.  There is complicated fluid layering in the pelvis as
described.  Considerations would include an infectious process or
hemorrhage such as seen with a ruptured hemorrhagic ovarian cyst.

Inflammatory nodes throughout the small bowel mesentery.

## 2014-09-07 ENCOUNTER — Other Ambulatory Visit: Payer: Self-pay | Admitting: Physician Assistant

## 2014-09-07 ENCOUNTER — Ambulatory Visit: Payer: Self-pay

## 2014-09-07 ENCOUNTER — Ambulatory Visit (INDEPENDENT_AMBULATORY_CARE_PROVIDER_SITE_OTHER): Payer: No Typology Code available for payment source

## 2014-09-07 ENCOUNTER — Encounter: Payer: Self-pay | Admitting: Family Medicine

## 2014-09-07 ENCOUNTER — Ambulatory Visit (INDEPENDENT_AMBULATORY_CARE_PROVIDER_SITE_OTHER): Payer: No Typology Code available for payment source | Admitting: Physician Assistant

## 2014-09-07 DIAGNOSIS — M25552 Pain in left hip: Secondary | ICD-10-CM

## 2014-09-07 DIAGNOSIS — M542 Cervicalgia: Secondary | ICD-10-CM

## 2014-09-07 DIAGNOSIS — M25512 Pain in left shoulder: Secondary | ICD-10-CM

## 2014-09-07 MED ORDER — CYCLOBENZAPRINE HCL ER 15 MG PO CP24: 15.0000 mg | ORAL_CAPSULE | Freq: Every day | ORAL | Status: DC | PRN

## 2014-09-07 MED ORDER — TRAMADOL HCL 50 MG PO TABS: 50.0000 mg | ORAL_TABLET | Freq: Three times a day (TID) | ORAL | Status: DC | PRN

## 2014-09-07 MED ORDER — IBUPROFEN 600 MG PO TABS: 600.0000 mg | ORAL_TABLET | Freq: Three times a day (TID) | ORAL | Status: DC | PRN
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
## 2014-09-07 NOTE — Patient Instructions (Signed)

## 2014-09-11 ENCOUNTER — Telehealth: Payer: Self-pay

## 2014-09-11 NOTE — Telephone Encounter (Signed)
Amrix has 24 hr dosing and is less sedating, but flexeril can be used instead.

## 2014-09-11 NOTE — Telephone Encounter (Signed)
Pamela Duarte, the Amrix ER needs a PA, not on ins plan formulary. Is there a reason she needs to take it instead of the regular cyclobenzaprine? Please advise and I can try to do a PA if needed.

## 2014-09-13 ENCOUNTER — Telehealth: Payer: Self-pay

## 2014-09-13 NOTE — Telephone Encounter (Signed)
Patient was recently seen and would like some advising. She states she's still in pain and would like to know if she should be seen or get a different prescription. Please call! [phone]
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
## 2014-09-14 ENCOUNTER — Ambulatory Visit (INDEPENDENT_AMBULATORY_CARE_PROVIDER_SITE_OTHER): Payer: No Typology Code available for payment source | Admitting: Family Medicine

## 2014-09-14 VITALS — BP 116/88 | HR 95 | Temp 98.5°F | Resp 18 | Ht 67.5 in | Wt 268.8 lb

## 2014-09-14 DIAGNOSIS — S7002XD Contusion of left hip, subsequent encounter: Secondary | ICD-10-CM | POA: Diagnosis not present

## 2014-09-14 DIAGNOSIS — M25552 Pain in left hip: Secondary | ICD-10-CM | POA: Diagnosis not present

## 2014-09-14 DIAGNOSIS — M545 Low back pain, unspecified: Secondary | ICD-10-CM

## 2014-09-14 DIAGNOSIS — S161XXD Strain of muscle, fascia and tendon at neck level, subsequent encounter: Secondary | ICD-10-CM | POA: Diagnosis not present

## 2014-09-14 MED ORDER — METAXALONE 800 MG PO TABS: 800.0000 mg | ORAL_TABLET | Freq: Three times a day (TID) | ORAL | Status: DC

## 2014-09-14 NOTE — Patient Instructions (Signed)
Take ibuprofen 800 mg 3 times daily  Hold the Amrix  Take Skelaxin 800 mg 3 times daily (metaxalone)  If not doing better by early in the week we will put through a referral for physical therapy  If needed return for a recheck in about 7-10 days
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
## 2014-09-19 NOTE — Telephone Encounter (Signed)
Pt's therapy was changed to a different muscle relaxant at 09/14/14 OV.

## 2015-02-14 ENCOUNTER — Ambulatory Visit (INDEPENDENT_AMBULATORY_CARE_PROVIDER_SITE_OTHER): Payer: Self-pay | Admitting: Family Medicine

## 2015-02-14 VITALS — BP 126/90 | HR 100 | Temp 98.8°F | Resp 18 | Ht 66.0 in | Wt 269.0 lb

## 2015-02-14 DIAGNOSIS — Z0000 Encounter for general adult medical examination without abnormal findings: Secondary | ICD-10-CM

## 2015-02-14 LAB — POCT CBC
Granulocyte percent: 67.1 %G (ref 37–80)
HCT, POC: 42.4 % (ref 37.7–47.9)
Hemoglobin: 14.3 g/dL (ref 12.2–16.2)
Lymph, poc: 2.3 (ref 0.6–3.4)
MCH, POC: 29.3 pg (ref 27–31.2)
MCHC: 33.8 g/dL (ref 31.8–35.4)
MCV: 86.6 fL (ref 80–97)
MID (cbc): 0.4 (ref 0–0.9)
MPV: 7.7 fL (ref 0–99.8)
POC Granulocyte: 5.5 (ref 2–6.9)
POC LYMPH PERCENT: 27.9 %L (ref 10–50)
POC MID %: 5 %M (ref 0–12)
Platelet Count, POC: 297 10*3/uL (ref 142–424)
RBC: 4.89 M/uL (ref 4.04–5.48)
RDW, POC: 14.3 %
WBC: 8.2 10*3/uL (ref 4.6–10.2)

## 2015-02-14 LAB — POC MICROSCOPIC URINALYSIS (UMFC): Mucus: ABSENT

## 2015-02-14 LAB — POCT URINALYSIS DIP (MANUAL ENTRY)
Bilirubin, UA: NEGATIVE
Blood, UA: NEGATIVE
Glucose, UA: NEGATIVE
Leukocytes, UA: NEGATIVE
Nitrite, UA: NEGATIVE
Protein Ur, POC: NEGATIVE
Spec Grav, UA: 1.02
Urobilinogen, UA: 0.2
pH, UA: 7

## 2015-02-14 NOTE — Progress Notes (Signed)
Patient ID: Pamela Duarte MRN: [id number], DOB: Apr 24, 1985, 30 y.o. Date of Encounter: 02/14/2015, 9:30 AM  Primary Physician: Tally Due, MD  Chief Complaint: Physical (CPE)  Going to nurse practitioner school.  No problems except occasional back pain from gynecomastia.  HPI: 30 y.o. y/o female with history of noted below here for CPE.  Doing well. No issues/complaint Review of Systems: Consitutional: No fever, chills, fatigue, night sweats, lymphadenopathy, or weight changes. Eyes: No visual changes, eye redness, or discharge. ENT/Mouth: Ears: No otalgia, tinnitus, hearing loss, discharge. Nose: No congestion, rhinorrhea, sinus pain, or epistaxis. Throat: No sore throat, post nasal drip, or teeth pain. Cardiovascular: No CP, palpitations, diaphoresis, DOE, edema, orthopnea, PND. Respiratory: No cough, hemoptysis, SOB, or wheezing. Gastrointestinal: No anorexia, dysphagia, reflux, pain, nausea, vomiting, hematemesis, diarrhea, constipation, BRBPR, or melena. Breast: No discharge, pain, swelling, or mass. Genitourinary: No dysuria, frequency, urgency, hematuria, incontinence, nocturia, amenorrhea, vaginal discharge, pruritis, burning, abnormal bleeding, or pain. Musculoskeletal: No decreased ROM, myalgias, stiffness, joint swelling, or weakness. Skin: No rash, erythema, lesion changes, pain, warmth, jaundice, or pruritis. Neurological: No headache, dizziness, syncope, seizures, tremors, memory loss, coordination problems, or paresthesias. Psychological: No anxiety, depression, hallucinations, SI/HI. Endocrine: No fatigue, polydipsia, polyphagia, polyuria, or known diabetes. All other systems were reviewed and are otherwise negative.  History reviewed. No pertinent past medical history.   Past Surgical History  Procedure Laterality Date  . Abdominoplasty  2015    Home Meds:  Prior to Admission medications   Medication Sig Start Date End Date Taking? Authorizing  Provider  oxyCODONE-acetaminophen (ROXICET) 5-325 MG per tablet Take 1 tablet by mouth every 8 (eight) hours as needed for severe pain. 04/12/14  Yes Elvina Sidle, MD    Allergies:  Allergies  Allergen Reactions  . Belviq [Lorcaserin Hcl] Nausea And Vomiting    Social History   Social History  . Marital Status: Single    Spouse Name: N/A  . Number of Children: N/A  . Years of Education: N/A   Occupational History  . Not on file.   Social History Main Topics  . Smoking status: Never Smoker   . Smokeless tobacco: Never Used  . Alcohol Use: No  . Drug Use: No  . Sexual Activity: Yes   Other Topics Concern  . Not on file   Social History Narrative    Family History  Problem Relation Age of Onset  . Diabetes Maternal Grandmother   . Hypertension Maternal Grandmother   . Diabetes Paternal Grandfather   . Hypertension Paternal Grandfather   . Cancer Paternal Grandfather   . Hypertension Paternal Grandmother   . Diabetes Paternal Grandmother   . Hypertension Mother     Physical Exam: Blood pressure 126/90, pulse 100, temperature 98.8 F (37.1 C), temperature source Oral, resp. rate 18, height  (1.676 m), weight 269 lb (122.018 kg), last menstrual period 02/01/2015, SpO2 98 %., Body mass index is 43.44 kg/(m^2). Wt Readings from Last 3 Encounters:  02/14/15 269 lb (122.018 kg)  09/14/14 268 lb 12.8 oz (121.927 kg)  09/07/14 268 lb (121.564 kg)   BP Readings from Last 3 Encounters:  02/14/15 126/90  09/14/14 116/88  09/07/14 110/82   General: Well developed, well nourished, in no acute distress. HEENT: Normocephalic, atraumatic. Conjunctiva pink, sclera non-icteric. Pupils 2 mm constricting to 1 mm, round, regular, and equally reactive to light and accomodation. EOMI. Fundi benign   Internal auditory canal clear. TMs with good cone of light and without pathology. Nasal  mucosa pink. Nares are without discharge. No sinus tenderness. Oral mucosa pink. Dentition  good. Pharynx without exudate.    Neck: Supple. Trachea midline. No thyromegaly. Full ROM. No lymphadenopathy. Lungs: Clear to auscultation bilaterally without wheezes, rales, or rhonchi. Breathing is of normal effort and unlabored. Cardiovascular: RRR with S1 S2. No murmurs, rubs, or gallops appreciated. Distal pulses 2+ symmetrically. No carotid or abdominal bruits. Abdomen: Soft, non-tender, non-distended with normoactive bowel sounds. No hepatosplenomegaly or masses. No rebound/guarding. No CVA tenderness. Without hernias.  Musculoskeletal: Full range of motion and 5/5 strength throughout. Without swelling, atrophy, tenderness, crepitus, or warmth. Extremities without clubbing, cyanosis, or edema. Calves supple. Skin: Warm and moist without erythema, ecchymosis, wounds, or rash. Neuro: A+Ox3. CN II-XII grossly intact. Moves all extremities spontaneously. Full sensation throughout. Normal gait. DTR 2+ throughout upper and lower extremities. Finger to nose intact. Psych:  Responds to questions appropriately with a normal affect.   Lab Results  Component Value Date   CHOL 144 04/12/2014   HDL 59 04/12/2014   LDLCALC 75 04/12/2014   TRIG 50 04/12/2014   CHOLHDL 2.4 04/12/2014    Assessment/Plan:  30 y.o. y/o female here for CPE   ICD-9-CM ICD-10-CM   1. Annual physical exam V70.0 Z00.00 POCT CBC     POCT Microscopic Urinalysis ([facility])     POCT urinalysis dipstick   -  Signed, Elvina Sidle, MD 02/14/2015 9:30 AM

## 2015-02-14 NOTE — Patient Instructions (Signed)
Health Maintenance, Female Adopting a healthy lifestyle and getting preventive care can go a long way to promote health and wellness. Talk with your health care provider about what schedule of regular examinations is right for you. This is a good chance for you to check in with your provider about disease prevention and staying healthy. In between checkups, there are plenty of things you can do on your own. Experts have done a lot of research about which lifestyle changes and preventive measures are most likely to keep you healthy. Ask your health care provider for more information. WEIGHT AND DIET  Eat a healthy diet  Be sure to include plenty of vegetables, fruits, low-fat dairy products, and lean protein.  Do not eat a lot of foods high in solid fats, added sugars, or salt.  Get regular exercise. This is one of the most important things you can do for your health.  Most adults should exercise for at least 150 minutes each week. The exercise should increase your heart rate and make you sweat (moderate-intensity exercise).  Most adults should also do strengthening exercises at least twice a week. This is in addition to the moderate-intensity exercise.  Maintain a healthy weight  Body mass index (BMI) is a measurement that can be used to identify possible weight problems. It estimates body fat based on height and weight. Your health care provider can help determine your BMI and help you achieve or maintain a healthy weight.  For females 20 years of age and older:   A BMI below 18.5 is considered underweight.  A BMI of 18.5 to 24.9 is normal.  A BMI of 25 to 29.9 is considered overweight.  A BMI of 30 and above is considered obese.  Watch levels of cholesterol and blood lipids  You should start having your blood tested for lipids and cholesterol at 31 years of age, then have this test every 5 years.  You may need to have your cholesterol levels checked more often if:  Your lipid  or cholesterol levels are high.  You are older than 31 years of age.  You are at high risk for heart disease.  CANCER SCREENING   Lung Cancer  Lung cancer screening is recommended for adults 55-80 years old who are at high risk for lung cancer because of a history of smoking.  A yearly low-dose CT scan of the lungs is recommended for people who:  Currently smoke.  Have quit within the past 15 years.  Have at least a 30-pack-year history of smoking. A pack year is smoking an average of one pack of cigarettes a day for 1 year.  Yearly screening should continue until it has been 15 years since you quit.  Yearly screening should stop if you develop a health problem that would prevent you from having lung cancer treatment.  Breast Cancer  Practice breast self-awareness. This means understanding how your breasts normally appear and feel.  It also means doing regular breast self-exams. Let your health care provider know about any changes, no matter how small.  If you are in your 20s or 30s, you should have a clinical breast exam (CBE) by a health care provider every 1-3 years as part of a regular health exam.  If you are 40 or older, have a CBE every year. Also consider having a breast X-ray (mammogram) every year.  If you have a family history of breast cancer, talk to your health care provider about genetic screening.  If you   are at high risk for breast cancer, talk to your health care provider about having an MRI and a mammogram every year.  Breast cancer gene (BRCA) assessment is recommended for women who have family members with BRCA-related cancers. BRCA-related cancers include:  Breast.  Ovarian.  Tubal.  Peritoneal cancers.  Results of the assessment will determine the need for genetic counseling and BRCA1 and BRCA2 testing. Cervical Cancer Your health care provider may recommend that you be screened regularly for cancer of the pelvic organs (ovaries, uterus, and  vagina). This screening involves a pelvic examination, including checking for microscopic changes to the surface of your cervix (Pap test). You may be encouraged to have this screening done every 3 years, beginning at age 21.  For women ages 30-65, health care providers may recommend pelvic exams and Pap testing every 3 years, or they may recommend the Pap and pelvic exam, combined with testing for human papilloma virus (HPV), every 5 years. Some types of HPV increase your risk of cervical cancer. Testing for HPV may also be done on women of any age with unclear Pap test results.  Other health care providers may not recommend any screening for nonpregnant women who are considered low risk for pelvic cancer and who do not have symptoms. Ask your health care provider if a screening pelvic exam is right for you.  If you have had past treatment for cervical cancer or a condition that could lead to cancer, you need Pap tests and screening for cancer for at least 20 years after your treatment. If Pap tests have been discontinued, your risk factors (such as having a new sexual partner) need to be reassessed to determine if screening should resume. Some women have medical problems that increase the chance of getting cervical cancer. In these cases, your health care provider may recommend more frequent screening and Pap tests. Colorectal Cancer  This type of cancer can be detected and often prevented.  Routine colorectal cancer screening usually begins at 31 years of age and continues through 31 years of age.  Your health care provider may recommend screening at an earlier age if you have risk factors for colon cancer.  Your health care provider may also recommend using home test kits to check for hidden blood in the stool.  A small camera at the end of a tube can be used to examine your colon directly (sigmoidoscopy or colonoscopy). This is done to check for the earliest forms of colorectal  cancer.  Routine screening usually begins at age 50.  Direct examination of the colon should be repeated every 5-10 years through 31 years of age. However, you may need to be screened more often if early forms of precancerous polyps or small growths are found. Skin Cancer  Check your skin from head to toe regularly.  Tell your health care provider about any new moles or changes in moles, especially if there is a change in a mole's shape or color.  Also tell your health care provider if you have a mole that is larger than the size of a pencil eraser.  Always use sunscreen. Apply sunscreen liberally and repeatedly throughout the day.  Protect yourself by wearing long sleeves, pants, a wide-brimmed hat, and sunglasses whenever you are outside. HEART DISEASE, DIABETES, AND HIGH BLOOD PRESSURE   High blood pressure causes heart disease and increases the risk of stroke. High blood pressure is more likely to develop in:  People who have blood pressure in the high end   of the normal range (130-139/85-89 mm Hg).  People who are overweight or obese.  People who are African American.  If you are 38-23 years of age, have your blood pressure checked every 3-5 years. If you are 61 years of age or older, have your blood pressure checked every year. You should have your blood pressure measured twice--once when you are at a hospital or clinic, and once when you are not at a hospital or clinic. Record the average of the two measurements. To check your blood pressure when you are not at a hospital or clinic, you can use:  An automated blood pressure machine at a pharmacy.  A home blood pressure monitor.  If you are between 45 years and 39 years old, ask your health care provider if you should take aspirin to prevent strokes.  Have regular diabetes screenings. This involves taking a blood sample to check your fasting blood sugar level.  If you are at a normal weight and have a low risk for diabetes,  have this test once every three years after 31 years of age.  If you are overweight and have a high risk for diabetes, consider being tested at a younger age or more often. PREVENTING INFECTION  Hepatitis B  If you have a higher risk for hepatitis B, you should be screened for this virus. You are considered at high risk for hepatitis B if:  You were born in a country where hepatitis B is common. Ask your health care provider which countries are considered high risk.  Your parents were born in a high-risk country, and you have not been immunized against hepatitis B (hepatitis B vaccine).  You have HIV or AIDS.  You use needles to inject street drugs.  You live with someone who has hepatitis B.  You have had sex with someone who has hepatitis B.  You get hemodialysis treatment.  You take certain medicines for conditions, including cancer, organ transplantation, and autoimmune conditions. Hepatitis C  Blood testing is recommended for:  Everyone born from [birth year] through 1966.  Anyone with known risk factors for hepatitis C. Sexually transmitted infections (STIs)  You should be screened for sexually transmitted infections (STIs) including gonorrhea and chlamydia if:  You are sexually active and are younger than 31 years of age.  You are older than 31 years of age and your health care provider tells you that you are at risk for this type of infection.  Your sexual activity has changed since you were last screened and you are at an increased risk for chlamydia or gonorrhea. Ask your health care provider if you are at risk.  If you do not have HIV, but are at risk, it may be recommended that you take a prescription medicine daily to prevent HIV infection. This is called pre-exposure prophylaxis (PrEP). You are considered at risk if:  You are sexually active and do not regularly use condoms or know the HIV status of your partner(s).  You take drugs by injection.  You are sexually  active with a partner who has HIV. Talk with your health care provider about whether you are at high risk of being infected with HIV. If you choose to begin PrEP, you should first be tested for HIV. You should then be tested every 3 months for as long as you are taking PrEP.  PREGNANCY   If you are premenopausal and you may become pregnant, ask your health care provider about preconception counseling.  If you may  become pregnant, take 400 to 800 micrograms (mcg) of folic acid every day.  If you want to prevent pregnancy, talk to your health care provider about birth control (contraception). OSTEOPOROSIS AND MENOPAUSE   Osteoporosis is a disease in which the bones lose minerals and strength with aging. This can result in serious bone fractures. Your risk for osteoporosis can be identified using a bone density scan.  If you are 61 years of age or older, or if you are at risk for osteoporosis and fractures, ask your health care provider if you should be screened.  Ask your health care provider whether you should take a calcium or vitamin D supplement to lower your risk for osteoporosis.  Menopause may have certain physical symptoms and risks.  Hormone replacement therapy may reduce some of these symptoms and risks. Talk to your health care provider about whether hormone replacement therapy is right for you.  HOME CARE INSTRUCTIONS   Schedule regular health, dental, and eye exams.  Stay current with your immunizations.   Do not use any tobacco products including cigarettes, chewing tobacco, or electronic cigarettes.  If you are pregnant, do not drink alcohol.  If you are breastfeeding, limit how much and how often you drink alcohol.  Limit alcohol intake to no more than 1 drink per day for nonpregnant women. One drink equals 12 ounces of beer, 5 ounces of wine, or 1 ounces of hard liquor.  Do not use street drugs.  Do not share needles.  Ask your health care provider for help if  you need support or information about quitting drugs.  Tell your health care provider if you often feel depressed.  Tell your health care provider if you have ever been abused or do not feel safe at home.   This information is not intended to replace advice given to you by your health care provider. Make sure you discuss any questions you have with your health care provider.   Document Released: 07/15/2010 Document Revised: 01/20/2014 Document Reviewed: 12/01/2012 Elsevier Interactive Patient Education [employer].

## 2015-12-17 ENCOUNTER — Ambulatory Visit (INDEPENDENT_AMBULATORY_CARE_PROVIDER_SITE_OTHER): Payer: PRIVATE HEALTH INSURANCE | Admitting: Family Medicine

## 2015-12-17 VITALS — BP 124/84 | HR 103 | Temp 99.0°F | Resp 16 | Ht 66.0 in | Wt 259.0 lb

## 2015-12-17 DIAGNOSIS — H9202 Otalgia, left ear: Secondary | ICD-10-CM | POA: Diagnosis not present

## 2015-12-17 DIAGNOSIS — H7292 Unspecified perforation of tympanic membrane, left ear: Secondary | ICD-10-CM

## 2015-12-17 MED ORDER — OFLOXACIN 0.3 % OT SOLN: 5.0000 [drp] | Freq: Two times a day (BID) | OTIC | 0 refills | Status: AC

## 2015-12-17 NOTE — Progress Notes (Signed)
Chief Complaint  Patient presents with  . Ear Pain    left/ x 1wk/ pt states there is pus coming form ear.    HPI  She reports that she was washing her hair Monday and Wednesday.  She reports that she used a qtip in her ear to get water out and felt pain.  She states that now she has pain in the left ear that is getting progressively worse since the last time she cleaned her ear with a qtip. She reports that she has been afebrile. No drainage from the ear.  She would rate her pain 8/10. Currently her pain is after taking tylenol brings it down to a 6/10.  Left ear pain  Tried tea tree oil, hydrogen peroxide.  No past medical history on file.  Current Outpatient Prescriptions  Medication Sig Dispense Refill  . oxyCODONE-acetaminophen (ROXICET) 5-325 MG per tablet Take 1 tablet by mouth every 8 (eight) hours as needed for severe pain. 15 tablet 0  . ofloxacin (FLOXIN) 0.3 % otic solution Place 5 drops into the left ear 2 (two) times daily. 5 mL 0   No current facility-administered medications for this visit.     Allergies:  Allergies  Allergen Reactions  . Belviq [Lorcaserin Hcl] Nausea And Vomiting    Past Surgical History:  Procedure Laterality Date  . ABDOMINOPLASTY  2015    Social History   Social History  . Marital status: Single    Spouse name: N/A  . Number of children: N/A  . Years of education: N/A   Social History Main Topics  . Smoking status: Never Smoker  . Smokeless tobacco: Never Used  . Alcohol use No  . Drug use: No  . Sexual activity: Yes   Other Topics Concern  . None   Social History Narrative  . None    Review of Systems  Constitutional: Negative for chills and fever.  HENT: Positive for ear pain and hearing loss. Negative for congestion, ear discharge, nosebleeds, sinus pain and tinnitus.        Pain with chewing and decreased hearing on the left side  Eyes: Negative for double vision and photophobia.  Respiratory: Negative for  shortness of breath, wheezing and stridor.   Gastrointestinal: Negative for nausea and vomiting.  Musculoskeletal: Negative for falls and neck pain.  Skin: Negative for itching and rash.  Neurological: Negative for dizziness, tingling, tremors and headaches.    Objective: Vitals:   12/17/15 0803  BP: 124/84  Pulse: (!) 103  Resp: 16  Temp: 99 F (37.2 C)  TempSrc: Oral  SpO2: 98%  Weight: 259 lb (117.5 kg)  Height: 5\' 6"  (1.676 m)    Physical Exam General: alert, oriented, in NAD Head: normocephalic, atraumatic, no sinus tenderness Eyes: EOM intact, no scleral icterus or conjunctival injection Ears: TM clear on the right, left TM ruptured completely, no purulent drainage Throat: no pharyngeal exudate or erythema Lymph: no posterior auricular, submental or cervical lymph adenopathy Heart: normal rate, normal sinus rhythm, no murmurs Lungs: clear to auscultation bilaterally, no wheezing   Assessment and Plan Pamela Duarte was seen today for ear pain.  Diagnoses and all orders for this visit:  Ruptured tympanic membrane, left Otalgia of left ear  Pt instructed on home care Advised her to use drops bid Printed script She will follow up with ENT Advised pt to keep water out of the ear  -     ofloxacin (FLOXIN) 0.3 % otic solution; Place 5 drops into  the left ear 2 (two) times daily.     Pamela Duarte A Pamela Duarte

## 2015-12-17 NOTE — Patient Instructions (Addendum)
     IF you received an x-ray today, you will receive an invoice from [facility]. Please contact [facility] at [phone] with questions or concerns regarding your invoice.   IF you received labwork today, you will receive an invoice from [facility]. Please contact [facility] at [phone] with questions or concerns regarding your invoice.   Our billing staff will not be able to assist you with questions regarding bills from these companies.  You will be contacted with the lab results as soon as they are available. The fastest way to get your results is to activate your My Chart account. Instructions are located on the last page of this paperwork. If you have not heard from us regarding the results in 2 weeks, please contact this office.      Eardrum Perforation Introduction The eardrum is a thin, round tissue inside the ear. It allows you to hear. The eardrum can get torn (perforated). Eardrums often heal on their own. There is often little or no long-term hearing loss. Follow these instructions at home:  Keep your ear dry while it heals. Do not let your head go under water. Do not swim or dive until your doctor says it is okay.  Before you take a bath or shower, do one of these things to keep water out of your ear:  Put a waterproof earplug in your ear.  Put petroleum jelly all over a cotton ball. Put the cotton ball in your ear.  Take medicines only as told by your doctor.  Avoid blowing your nose if you can. If you blow your nose, do it gently.  Continue your normal activities after your eardrum heals. Your doctor will tell you when your eardrum has healed.  Talk to your doctor before you fly on an airplane.  Keep all doctor follow-up visits as told by your doctor. This is important. Contact a doctor if:  You have a fever. Get help right away if:  You have blood or yellowish-white fluid (pus) coming from your ear.  You  feel dizzy or off balance.  You feel sick to your stomach (nauseous), or you throw up (vomit).  You have more pain. This information is not intended to replace advice given to you by your health care provider. Make sure you discuss any questions you have with your health care provider. Document Released: 06/19/2009 Document Revised: 06/07/2015 Document Reviewed: 08/08/2013  2017 Elsevier

## 2015-12-22 ENCOUNTER — Encounter: Payer: Self-pay | Admitting: Family Medicine

## 2015-12-27 ENCOUNTER — Encounter: Payer: Self-pay | Admitting: Family Medicine

## 2015-12-27 NOTE — Telephone Encounter (Signed)
LVOM for pt regarding her referral to [facility] told to call back with any other questions

## 2016-04-21 DIAGNOSIS — H33102 Unspecified retinoschisis, left eye: Secondary | ICD-10-CM | POA: Diagnosis not present

## 2016-11-04 DIAGNOSIS — H33102 Unspecified retinoschisis, left eye: Secondary | ICD-10-CM | POA: Diagnosis not present

## 2016-11-06 DIAGNOSIS — R918 Other nonspecific abnormal finding of lung field: Secondary | ICD-10-CM | POA: Diagnosis not present

## 2016-11-06 DIAGNOSIS — R0989 Other specified symptoms and signs involving the circulatory and respiratory systems: Secondary | ICD-10-CM | POA: Diagnosis not present

## 2017-06-18 ENCOUNTER — Encounter: Payer: PRIVATE HEALTH INSURANCE | Admitting: Family Medicine

## 2017-06-19 ENCOUNTER — Encounter: Payer: Self-pay | Admitting: Family Medicine

## 2017-06-19 ENCOUNTER — Ambulatory Visit (INDEPENDENT_AMBULATORY_CARE_PROVIDER_SITE_OTHER): Payer: BLUE CROSS/BLUE SHIELD | Admitting: Family Medicine

## 2017-06-19 ENCOUNTER — Other Ambulatory Visit: Payer: Self-pay

## 2017-06-19 VITALS — BP 130/92 | HR 97 | Temp 98.8°F | Ht 67.32 in | Wt 251.8 lb

## 2017-06-19 DIAGNOSIS — E6609 Other obesity due to excess calories: Secondary | ICD-10-CM

## 2017-06-19 DIAGNOSIS — E281 Androgen excess: Secondary | ICD-10-CM | POA: Diagnosis not present

## 2017-06-19 DIAGNOSIS — N979 Female infertility, unspecified: Secondary | ICD-10-CM

## 2017-06-19 DIAGNOSIS — Z0000 Encounter for general adult medical examination without abnormal findings: Secondary | ICD-10-CM

## 2017-06-19 DIAGNOSIS — E282 Polycystic ovarian syndrome: Secondary | ICD-10-CM | POA: Diagnosis not present

## 2017-06-19 DIAGNOSIS — Z6839 Body mass index (BMI) 39.0-39.9, adult: Secondary | ICD-10-CM

## 2017-06-19 DIAGNOSIS — Z124 Encounter for screening for malignant neoplasm of cervix: Secondary | ICD-10-CM | POA: Diagnosis not present

## 2017-06-19 NOTE — Patient Instructions (Signed)
     IF you received an x-ray today, you will receive an invoice from [facility]. Please contact [facility] at [phone] with questions or concerns regarding your invoice.   IF you received labwork today, you will receive an invoice from [facility]. Please contact [facility] at [phone] with questions or concerns regarding your invoice.   Our billing staff will not be able to assist you with questions regarding bills from these companies.  You will be contacted with the lab results as soon as they are available. The fastest way to get your results is to activate your My Chart account. Instructions are located on the last page of this paperwork. If you have not heard from us regarding the results in 2 weeks, please contact this office.     

## 2017-06-19 NOTE — Progress Notes (Signed)
Chief Complaint  Patient presents with  . Gynecologic Exam    having trouble conceiving, Would like referral for Infertility specialist. has been trying for 1 yr+    HPI  Patient is here for a annual exam and to discuss the problem of infertility.  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   Pt has only tried pills a few times maybe six years ago She reports that she has been trying to conceive for 1.5 years. She reports that she has been having intercourse 3-5 times a week She uses a flow chart that looks at your predicted ovulation days She states that her period can be a longer interval lasting 35-40 days  She reports that she bleeds for 5 days max She is not currently taking any daily medications She intermittently takes prenatal vitamins  She is a nonsmoker G0P0  She does not exercise  4 review of systems  History reviewed. No pertinent past medical history.  Current Outpatient Medications  Medication Sig Dispense Refill  . oxyCODONE-acetaminophen (ROXICET) 5-325 MG per tablet Take 1 tablet by mouth every 8 (eight) hours as needed for severe pain. 15 tablet 0   No current facility-administered medications for this visit.     Allergies:  Allergies  Allergen Reactions  . Belviq [Lorcaserin Hcl] Nausea And Vomiting    Past Surgical History:  Procedure Laterality Date  . ABDOMINOPLASTY  2015    Social History   Socioeconomic History  . Marital status: Married    Spouse name: Not on file  . Number of children: Not on file  . Years of education: Not on file  . Highest education level: Not on file  Occupational History  . Not on file  Social Needs  . Financial resource strain: Not on file  . Food insecurity:    Worry: Not on file    Inability: Not on file  . Transportation needs:    Medical: Not on file    Non-medical: Not on file  Tobacco Use  . Smoking status: Never Smoker  . Smokeless tobacco: Never Used  Substance  and Sexual Activity  . Alcohol use: No  . Drug use: No  . Sexual activity: Yes  Lifestyle  . Physical activity:    Days per week: Not on file    Minutes per session: Not on file  . Stress: Not on file  Relationships  . Social connections:    Talks on phone: Not on file    Gets together: Not on file    Attends religious service: Not on file    Active member of club or organization: Not on file    Attends meetings of clubs or organizations: Not on file    Relationship status: Not on file  Other Topics Concern  . Not on file  Social History Narrative  . Not on file    Family History  Problem Relation Age of Onset  . Diabetes Maternal Grandmother   . Hypertension Maternal Grandmother   . Diabetes Paternal Grandfather   . Hypertension Paternal Grandfather   . Cancer Paternal Grandfather   . Hypertension Paternal Grandmother   . Diabetes Paternal Grandmother   . Hypertension Mother      ROS Review of Systems See HPI Constitution: No fevers or chills No malaise No diaphoresis Skin: No rash or itching Eyes: no blurry vision, no double vision GU: no dysuria or hematuria Neuro: no dizziness or headaches all others reviewed and negative  Objective: Vitals:   06/19/17 0956  BP: (!) 130/92  Pulse: 97  Temp: 98.8 F (37.1 C)  TempSrc: Oral  SpO2: 100%  Weight: 251 lb 12.8 oz (114.2 kg)  Height: 5' 7.32" (1.71 m)  Body mass index is 39.06 kg/m.   Physical Exam  Constitutional: She is oriented to person, place, and time. She appears well-developed and well-nourished.  HENT:  Head: Normocephalic and atraumatic.  Eyes: Conjunctivae and EOM are normal.  Neck: Normal range of motion. Neck supple.  Cardiovascular: Normal rate, regular rhythm and normal heart sounds.  No murmur heard. Pulmonary/Chest: Effort normal and breath sounds normal. No stridor. No respiratory distress. She has no wheezes. She has no rales.  Neurological: She is alert and oriented to person,  place, and time.  Skin: Skin is warm. Capillary refill takes less than 2 seconds.  Psychiatric: She has a normal mood and affect. Her behavior is normal. Judgment and thought content normal.   Vaginal exam- Chaperone Present Labia normal bilaterally without skin lesions Urethral meatus normal appearing without erythema Vagina without discharge No CMT, ovaries small and not palpable Uterus midline, nontender Pap smear performed    Assessment and Plan [name] was seen today for gynecologic exam.  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 Referral placed to evaluate tubo-ovarian anatomy -     TestT+TestF+SHBG -     FSH/LH -     Ambulatory referral to Gynecology  Encounter for Papanicolaou smear for cervical cancer screening- discussed pap smear screening guidelines -     Pap IG, CT/NG NAA, and HPV (high risk)  PCOS (polycystic ovarian syndrome)- pt with signs of insulin resistance and increase androgen -     Insulin, Free and Total -     Hemoglobin A1c -     Ambulatory referral to Gynecology  Class 2 obesity due to excess calories without serious comorbidity with body mass index (BMI) of 39.0 to 39.9 in adult -  Discussed diet and exercise program  If she has diabetes or prediabetes metformin would be helpful for weight loss and help with androgen excess    Cheryln Balcom A Creta LevinStallings

## 2017-06-22 ENCOUNTER — Encounter: Payer: Self-pay | Admitting: Family Medicine

## 2017-06-22 ENCOUNTER — Telehealth: Payer: Self-pay | Admitting: Family Medicine

## 2017-06-22 NOTE — Telephone Encounter (Signed)
Left message to return to clinic to discuss abnormal labs. Also stated in message that she has a1c in the diabetic range and treatment can be discussed in the office.  Also told her to check her mychart.

## 2017-06-23 LAB — FSH/LH
FSH: 3.8 m[IU]/mL
LH: 11.9 m[IU]/mL

## 2017-06-23 LAB — PAP IG, CT-NG NAA, HPV HIGH-RISK
Chlamydia, Nuc. Acid Amp: NEGATIVE
GONOCOCCUS BY NUCLEIC ACID AMP: NEGATIVE
HPV, HIGH-RISK: NEGATIVE
PAP Smear Comment: 0

## 2017-06-23 LAB — HEMOGLOBIN A1C
Est. average glucose Bld gHb Est-mCnc: 272 mg/dL
HEMOGLOBIN A1C: 11.1 % — AB (ref 4.8–5.6)

## 2017-06-23 LAB — DHEA: DEHYDROEPIANDROSTERO: 145 ng/dL (ref 31–701)

## 2017-06-23 LAB — INSULIN, FREE AND TOTAL
FREE INSULIN: 23 uU/mL — AB
TOTAL INSULIN: 23 uU/mL

## 2017-06-23 LAB — TESTT+TESTF+SHBG
SEX HORMONE BINDING: 15 nmol/L — AB (ref 24.6–122.0)
TESTOSTERONE FREE: 1.4 pg/mL (ref 0.0–4.2)
TESTOSTERONE, TOTAL: 30.8 ng/dL (ref 10.0–55.0)

## 2017-06-23 LAB — ESTRADIOL: ESTRADIOL: 130 pg/mL

## 2017-06-25 ENCOUNTER — Ambulatory Visit: Payer: PRIVATE HEALTH INSURANCE | Admitting: Family Medicine

## 2017-06-25 ENCOUNTER — Other Ambulatory Visit: Payer: Self-pay

## 2017-06-25 ENCOUNTER — Ambulatory Visit (INDEPENDENT_AMBULATORY_CARE_PROVIDER_SITE_OTHER): Payer: BLUE CROSS/BLUE SHIELD | Admitting: Family Medicine

## 2017-06-25 ENCOUNTER — Encounter: Payer: Self-pay | Admitting: Family Medicine

## 2017-06-25 VITALS — BP 123/86 | HR 97 | Temp 99.6°F | Resp 17 | Ht 67.32 in | Wt 250.8 lb

## 2017-06-25 DIAGNOSIS — Z13220 Encounter for screening for lipoid disorders: Secondary | ICD-10-CM | POA: Diagnosis not present

## 2017-06-25 DIAGNOSIS — Z6839 Body mass index (BMI) 39.0-39.9, adult: Secondary | ICD-10-CM | POA: Diagnosis not present

## 2017-06-25 DIAGNOSIS — E119 Type 2 diabetes mellitus without complications: Secondary | ICD-10-CM

## 2017-06-25 DIAGNOSIS — E6609 Other obesity due to excess calories: Secondary | ICD-10-CM

## 2017-06-25 DIAGNOSIS — Z3169 Encounter for other general counseling and advice on procreation: Secondary | ICD-10-CM | POA: Diagnosis not present

## 2017-06-25 MED ORDER — ETONOGESTREL-ETHINYL ESTRADIOL 0.12-0.015 MG/24HR VA RING: VAGINAL_RING | VAGINAL | 12 refills | Status: DC

## 2017-06-25 MED ORDER — FREESTYLE LIBRE 14 DAY READER DEVI: 1.0000 "application " | Freq: Every day | 0 refills | Status: DC

## 2017-06-25 MED ORDER — METFORMIN HCL 500 MG PO TABS: 500.0000 mg | ORAL_TABLET | Freq: Two times a day (BID) | ORAL | 3 refills | Status: DC

## 2017-06-25 MED ORDER — DAPAGLIFLOZIN PROPANEDIOL 5 MG PO TABS: 5.0000 mg | ORAL_TABLET | Freq: Every day | ORAL | 0 refills | Status: DC

## 2017-06-25 MED ORDER — FREESTYLE LIBRE 14 DAY SENSOR MISC: 1.0000 "application " | Freq: Every day | 11 refills | Status: DC

## 2017-06-25 NOTE — Patient Instructions (Signed)
     IF you received an x-ray today, you will receive an invoice from [facility]. Please contact [facility] at [phone] with questions or concerns regarding your invoice.   IF you received labwork today, you will receive an invoice from [facility]. Please contact [facility] at [phone] with questions or concerns regarding your invoice.   Our billing staff will not be able to assist you with questions regarding bills from these companies.  You will be contacted with the lab results as soon as they are available. The fastest way to get your results is to activate your My Chart account. Instructions are located on the last page of this paperwork. If you have not heard from us regarding the results in 2 weeks, please contact this office.     

## 2017-06-25 NOTE — Progress Notes (Signed)
Chief Complaint  Patient presents with  . Labs Only    follow up    HPI   Diabetes Mellitus Type II, Initial Visit: She is a newly diagnosed diabetic based on screening labs Patient here for an initial evaluation of Type 2 diabetes mellitus.  Current symptoms/problems include fatigue but she works nights.  The patient was initially diagnosed with Type 2 diabetes mellitus based on the following criteria:  A1c>7% Lab Results  Component Value Date   HGBA1C 11.1 (H) 06/19/2017   Her last check of her a1c 1-2 years ago showed a a1c of 6% She adheres to a very healthy diet and wonders why she became diabetic so quickly. She has struggled with obesity and facial hair (to the point of having to shave and apply concealer).  Known diabetic complications: infertility Cardiovascular risk factors: obesity (BMI >= 30 kg/m2) Current diabetic medications include none.   Eye exam current (within one year): yes Weight trend: stable Prior visit with dietician: no Current diet: in general, a "healthy" diet   Current exercise: walking  Current monitoring regimen: none Home blood sugar records: just diagnosed so has never tracked her sugars. Would like to try freestyle libre.  Any episodes of hypoglycemia? no  Is She on ACE inhibitor or angiotensin II receptor blocker?  No  BP Readings from Last 3 Encounters:  06/25/17 123/86  06/19/17 (!) 130/92  12/17/15 124/84    Component                                           Latest Ref Rng & Units 06/19/2017  Free Insulin                                                         uU/mL 23 (H)  Total Insulin                                                        uU/mL 23  Hemoglobin A1C                                              4.8 - 5.6 % 11.1 (H)  Est. average glucose Bld gHb Est-mCnc          mg/dL 161      No past medical history on file.  Current Outpatient Medications  Medication Sig Dispense Refill  . oxyCODONE-acetaminophen (ROXICET)  5-325 MG per tablet Take 1 tablet by mouth every 8 (eight) hours as needed for severe pain. 15 tablet 0  . Continuous Blood Gluc Receiver (FREESTYLE LIBRE 14 DAY READER) DEVI 1 application by Does not apply route daily. E11.9 1 Device 0  . Continuous Blood Gluc Sensor (FREESTYLE LIBRE 14 DAY SENSOR) MISC 1 application by Does not apply route daily. 1 each 11  . dapagliflozin propanediol (FARXIGA) 5 MG TABS tablet Take 5 mg by mouth daily. 30 tablet 0  .  etonogestrel-ethinyl estradiol (NUVARING) 0.12-0.015 MG/24HR vaginal ring Insert vaginally and leave in place for 3 consecutive weeks, then remove for 1 week. 1 each 12  . metFORMIN (GLUCOPHAGE) 500 MG tablet Take 1 tablet (500 mg total) by mouth 2 (two) times daily with a meal. 180 tablet 3   No current facility-administered medications for this visit.     Allergies:  Allergies  Allergen Reactions  . Belviq [Lorcaserin Hcl] Nausea And Vomiting    Past Surgical History:  Procedure Laterality Date  . ABDOMINOPLASTY  2015    Social History   Socioeconomic History  . Marital status: Married    Spouse name: Not on file  . Number of children: Not on file  . Years of education: Not on file  . Highest education level: Not on file  Occupational History  . Not on file  Social Needs  . Financial resource strain: Not on file  . Food insecurity:    Worry: Not on file    Inability: Not on file  . Transportation needs:    Medical: Not on file    Non-medical: Not on file  Tobacco Use  . Smoking status: Never Smoker  . Smokeless tobacco: Never Used  Substance and Sexual Activity  . Alcohol use: No  . Drug use: No  . Sexual activity: Yes  Lifestyle  . Physical activity:    Days per week: Not on file    Minutes per session: Not on file  . Stress: Not on file  Relationships  . Social connections:    Talks on phone: Not on file    Gets together: Not on file    Attends religious service: Not on file    Active member of club or  organization: Not on file    Attends meetings of clubs or organizations: Not on file    Relationship status: Not on file  Other Topics Concern  . Not on file  Social History Narrative  . Not on file    Family History  Problem Relation Age of Onset  . Diabetes Maternal Grandmother   . Hypertension Maternal Grandmother   . Diabetes Paternal Grandfather   . Hypertension Paternal Grandfather   . Cancer Paternal Grandfather   . Hypertension Paternal Grandmother   . Diabetes Paternal Grandmother   . Hypertension Mother      ROS Review of Systems See HPI Constitution: No fevers or chills No malaise No diaphoresis Skin: No rash or itching Eyes: no blurry vision, no double vision GU: no dysuria or hematuria Neuro: no dizziness or headaches * all others reviewed and negative   Objective: Vitals:   06/25/17 1507  BP: 123/86  Pulse: 97  Resp: 17  Temp: 99.6 F (37.6 C)  TempSrc: Oral  SpO2: 96%  Weight: 250 lb 12.8 oz (113.8 kg)  Height: 5' 7.32" (1.71 m)    Physical Exam  Constitutional: She is oriented to person, place, and time. She appears well-developed and well-nourished.  HENT:  Head: Normocephalic and atraumatic.  Eyes: Conjunctivae and EOM are normal.  Pulmonary/Chest: Effort normal.  Neurological: She is alert and oriented to person, place, and time.  Skin: Skin is warm. Capillary refill takes less than 2 seconds.  Psychiatric: She has a normal mood and affect. Her behavior is normal. Judgment and thought content normal.      Assessment and Plan Pamela Duarte was seen today for labs only.  Diagnoses and all orders for this visit:  Newly diagnosed diabetes (HCC) -  Lipid panel -     Comprehensive metabolic panel -     etonogestrel-ethinyl estradiol (NUVARING) 0.12-0.015 MG/24HR vaginal ring; Insert vaginally and leave in place for 3 consecutive weeks, then remove for 1 week.  Infertility counseling -     etonogestrel-ethinyl estradiol (NUVARING)  0.12-0.015 MG/24HR vaginal ring; Insert vaginally and leave in place for 3 consecutive weeks, then remove for 1 week.  Class 2 obesity due to excess calories without serious comorbidity with body mass index (BMI) of 39.0 to 39.9 in adult -     Lipid panel -     Comprehensive metabolic panel  Screening, lipid -     Lipid panel -     Comprehensive metabolic panel  Other orders -     metFORMIN (GLUCOPHAGE) 500 MG tablet; Take 1 tablet (500 mg total) by mouth 2 (two) times daily with a meal. -     dapagliflozin propanediol (FARXIGA) 5 MG TABS tablet; Take 5 mg by mouth daily. -     Continuous Blood Gluc Receiver (FREESTYLE LIBRE 14 DAY READER) DEVI; 1 application by Does not apply route daily. E11.9 -     Continuous Blood Gluc Sensor (FREESTYLE LIBRE 14 DAY SENSOR) MISC; 1 application by Does not apply route daily.    A total of 40 minutes were spent face-to-face with the patient during this encounter and over half of that time was spent on counseling and coordination of care.  Discussed that due to her uncontrolled diabetes and that highly deranged diabetes is teratogenic she should get her sugars controlled then plan to conceive after establishing with OB and discussing her infertility in general Once her sugars are better controlled then a plan could be made for a medication that is safe in pregnancy She agrees with this plan Though she is disappointed to have to wait to try to conceive she is willing to use this time to regulate her diabetes and to also get an evaluation for her infertility from [facility]. At this point it seems like her diabetes might have played a role significant role.  Also discussed that she has PCOS features which is a risk factor for diabetes and can lead to infertility due to difficulty with ovulation.   She is motivated and understands the medications, common side effects and plans to use her freestyle libre Plan for close follow up in one month at which time would  discuss additional referrals such as nutrition    Ordered additional lab today as the cmp and lipid were not done last visit.  Would like to verify normal renal and liver function. Also would like to know her lipid levels.   Megan Hayduk A Reneshia Zuccaro

## 2017-06-26 DIAGNOSIS — Z6839 Body mass index (BMI) 39.0-39.9, adult: Secondary | ICD-10-CM

## 2017-06-26 DIAGNOSIS — E119 Type 2 diabetes mellitus without complications: Secondary | ICD-10-CM | POA: Insufficient documentation

## 2017-06-26 DIAGNOSIS — E6609 Other obesity due to excess calories: Secondary | ICD-10-CM | POA: Insufficient documentation

## 2017-06-26 DIAGNOSIS — Z3169 Encounter for other general counseling and advice on procreation: Secondary | ICD-10-CM | POA: Insufficient documentation

## 2017-06-26 LAB — COMPREHENSIVE METABOLIC PANEL
A/G RATIO: 1.2 (ref 1.2–2.2)
ALT: 24 IU/L (ref 0–32)
AST: 19 IU/L (ref 0–40)
Albumin: 4.2 g/dL (ref 3.5–5.5)
Alkaline Phosphatase: 58 IU/L (ref 39–117)
BILIRUBIN TOTAL: 0.5 mg/dL (ref 0.0–1.2)
BUN/Creatinine Ratio: 15 (ref 9–23)
BUN: 10 mg/dL (ref 6–20)
CHLORIDE: 100 mmol/L (ref 96–106)
CO2: 23 mmol/L (ref 20–29)
Calcium: 9.7 mg/dL (ref 8.7–10.2)
Creatinine, Ser: 0.66 mg/dL (ref 0.57–1.00)
GFR calc non Af Amer: 116 mL/min/{1.73_m2} (ref >59)
GFR, EST AFRICAN AMERICAN: 134 mL/min/{1.73_m2} (ref >59)
GLOBULIN, TOTAL: 3.5 g/dL (ref 1.5–4.5)
Glucose: 174 mg/dL — ABNORMAL HIGH (ref 65–99)
POTASSIUM: 4.3 mmol/L (ref 3.5–5.2)
SODIUM: 139 mmol/L (ref 134–144)
Total Protein: 7.7 g/dL (ref 6.0–8.5)

## 2017-06-26 LAB — LIPID PANEL
CHOLESTEROL TOTAL: 147 mg/dL (ref 100–199)
Chol/HDL Ratio: 3.2 ratio (ref 0.0–4.4)
HDL: 46 mg/dL (ref >39)
LDL CALC: 85 mg/dL (ref 0–99)
Triglycerides: 81 mg/dL (ref 0–149)
VLDL Cholesterol Cal: 16 mg/dL (ref 5–40)

## 2017-06-27 ENCOUNTER — Encounter: Payer: Self-pay | Admitting: Family Medicine

## 2017-06-29 ENCOUNTER — Other Ambulatory Visit: Payer: Self-pay | Admitting: Family Medicine

## 2017-06-29 MED ORDER — FREESTYLE LIBRE 14 DAY SENSOR MISC: 1.0000 "application " | Freq: Every day | 4 refills | Status: DC

## 2017-07-08 ENCOUNTER — Telehealth: Payer: Self-pay | Admitting: Family Medicine

## 2017-07-08 NOTE — Telephone Encounter (Signed)
Copied from CRM [id number]. Topic: Quick Communication - Rx Refill/Question >> Jul 08, 2017  4:12 PM Cipriano BunkerLambe, Annette S wrote: Medication:   needing test strips for FREESTYLE LIBRE 14 DAY READER) She is needing to double check her blood sugar as it saying it is low.    Has the patient contacted their pharmacy? No. (Agent: If no, request that the patient contact the pharmacy for the refill.) (Agent: If yes, when and what did the pharmacy advise?)  Preferred Pharmacy (with phone number or street name):   [facility] [address] - [place], [place] - [id number] [place] [id number] Phone: [phone] Fax: [phone]    Agent: Please be advised that RX refills may take up to 3 business days. We ask that you follow-up with your pharmacy.

## 2017-07-08 NOTE — Telephone Encounter (Signed)
Pt. transferred to a nurse to discuss request for Rx for diabetic test strips.  Pt. stated she has the [employer]estyle Libre Sensor to check blood sugars.  Reported has had readings of 89-160, prior to today, and has obtained "low" readings today.  Reported her blood sugar was 120 at 10:30 AM. Then, the result showed "low" on the meter.  Reported she obtained reading of 70, just prior to calling the office. Stated that she feels fine; denied feeling shaky, sweaty, dizzy, or anxious.  Is asking to get an Rx for the test strips that go with the [facility], because she wants to know what the "low" reading values are.  Advised will send note to Dr. Creta LevinStallings.  The pt. stated, "I've been told this could take 2 or 3 days, and I don't want to wait that long."  Advised will make Dr. Creta LevinStallings aware.  Pt. Agreed with plan.

## 2017-07-09 ENCOUNTER — Other Ambulatory Visit: Payer: Self-pay

## 2017-07-09 MED ORDER — GLUCOSE BLOOD VI STRP: ORAL_STRIP | 12 refills | Status: DC

## 2017-07-09 NOTE — Telephone Encounter (Signed)
Ordered test strips for [employer] Lib[employer] 14 day reader.

## 2017-07-10 DIAGNOSIS — Z3169 Encounter for other general counseling and advice on procreation: Secondary | ICD-10-CM | POA: Diagnosis not present

## 2017-07-10 DIAGNOSIS — E282 Polycystic ovarian syndrome: Secondary | ICD-10-CM | POA: Diagnosis not present

## 2017-07-10 DIAGNOSIS — Z3162 Encounter for fertility preservation counseling: Secondary | ICD-10-CM | POA: Diagnosis not present

## 2017-07-23 ENCOUNTER — Ambulatory Visit (INDEPENDENT_AMBULATORY_CARE_PROVIDER_SITE_OTHER): Payer: BLUE CROSS/BLUE SHIELD | Admitting: Family Medicine

## 2017-07-23 ENCOUNTER — Encounter: Payer: Self-pay | Admitting: Family Medicine

## 2017-07-23 ENCOUNTER — Other Ambulatory Visit: Payer: Self-pay

## 2017-07-23 VITALS — BP 114/79 | HR 87 | Temp 99.6°F | Resp 17 | Ht 67.32 in | Wt 251.0 lb

## 2017-07-23 DIAGNOSIS — Z23 Encounter for immunization: Secondary | ICD-10-CM

## 2017-07-23 DIAGNOSIS — B373 Candidiasis of vulva and vagina: Secondary | ICD-10-CM | POA: Diagnosis not present

## 2017-07-23 DIAGNOSIS — Z114 Encounter for screening for human immunodeficiency virus [HIV]: Secondary | ICD-10-CM

## 2017-07-23 DIAGNOSIS — B3731 Acute candidiasis of vulva and vagina: Secondary | ICD-10-CM

## 2017-07-23 DIAGNOSIS — E118 Type 2 diabetes mellitus with unspecified complications: Secondary | ICD-10-CM | POA: Diagnosis not present

## 2017-07-23 LAB — POCT GLYCOSYLATED HEMOGLOBIN (HGB A1C): HEMOGLOBIN A1C: 8.3 % — AB (ref 4.0–5.6)

## 2017-07-23 MED ORDER — GLUCOSE BLOOD VI STRP: ORAL_STRIP | 12 refills | Status: DC

## 2017-07-23 MED ORDER — FREESTYLE LIBRE 14 DAY SENSOR MISC: 1.0000 "application " | Freq: Every day | 4 refills | Status: DC

## 2017-07-23 MED ORDER — FLUCONAZOLE 150 MG PO TABS: 150.0000 mg | ORAL_TABLET | Freq: Once | ORAL | 1 refills | Status: AC

## 2017-07-23 NOTE — Patient Instructions (Addendum)
   IF you received an x-ray today, you will receive an invoice from [facility]. Please contact [facility] at [phone] with questions or concerns regarding your invoice.   IF you received labwork today, you will receive an invoice from [facility]. Please contact [facility] at [phone] with questions or concerns regarding your invoice.   Our billing staff will not be able to assist you with questions regarding bills from these companies.  You will be contacted with the lab results as soon as they are available. The fastest way to get your results is to activate your My Chart account. Instructions are located on the last page of this paperwork. If you have not heard from us regarding the results in 2 weeks, please contact this office.     How to Avoid Diabetes Mellitus Problems You can take action to prevent or slow down problems that are caused by diabetes (diabetes mellitus). Following your diabetes plan and taking care of yourself can reduce your risk of serious or life-threatening complications. Manage your diabetes  Follow instructions from your health care providers about managing your diabetes. Your diabetes may be managed by a team of health care providers who can teach you how to care for yourself and can answer questions that you have.  Educate yourself about your condition so you can make healthy choices about eating and physical activity.  Check your blood sugar (glucose) levels as often as directed. Your health care provider will help you decide how often to check your blood glucose level depending on your treatment goals and how well you are meeting them.  Ask your health care provider if you should take low-dose aspirin daily and what dose is recommended for you. Taking low-dose aspirin daily is recommended to help prevent cardiovascular disease. Do not use nicotine or tobacco Do not use any products that contain nicotine or tobacco, such as cigarettes  and e-cigarettes. If you need help quitting, ask your health care provider. Nicotine raises your risk for diabetes problems. If you quit using nicotine:  You will lower your risk for heart attack, stroke, nerve disease, and kidney disease.  Your cholesterol and blood pressure may improve.  Your blood circulation will improve.  Keep your blood pressure under control To control your blood pressure:  Follow instructions from your health care provider about meal planning, exercise, and medicines.  Make sure your health care provider checks your blood pressure at every medical visit.  A blood pressure reading consists of two numbers. Generally, the goal is to keep your top number (systolic pressure) at or below 130, and your bottom number (diastolic pressure) at or below 80. Your health care provider may recommend a lower target blood pressure. Your individualized target blood pressure is determined based on:  Your age.  Your medicines.  How long you have had diabetes.  Any other medical conditions you have.  Keep your cholesterol under control To control your cholesterol:  Follow instructions from your health care provider about meal planning, exercise, and medicines.  Have your cholesterol checked at least once a year.  You may be prescribed medicine to lower cholesterol (statin). If you are not taking a statin, ask your health care provider if you should be.  Controlling your cholesterol may:  Help prevent heart disease and stroke. These are the most common health problems for people with diabetes.  Improve your blood flow.  Schedule and keep yearly physical exams and eye exams Your health care provider will tell you how   often you need medical visits depending on your diabetes management plan. Keep all follow-up visits as directed. This is important so possible problems can be identified early and complications can be avoided or treated.  Every visit with your health care  provider should include measuring your: ? Weight. ? Blood pressure. ? Blood glucose control.  Your A1c (hemoglobin A1c) level should be checked: ? At least 2 times a year, if you are meeting your treatment goals. ? 4 times a year, if you are not meeting treatment goals or if your treatment goals have changed.  Your blood lipids (lipid profile) should be checked yearly. You should also be checked yearly for protein in your urine (urine microalbumin).  If you have type 1 diabetes, get an eye exam 3-5 years after you are diagnosed, and then once a year after your first exam.  If you have type 2 diabetes, get an eye exam as soon as you are diagnosed, and then once a year after your first exam.  Keep your vaccines current It is recommended that you receive:  A flu (influenza) vaccine every year.  A pneumonia (pneumococcal) vaccine and a hepatitis B vaccine. If you are age 65 or older, you may get the pneumonia vaccine as a series of two separate shots.  Ask your health care provider which other vaccines may be recommended. Take care of your feet Diabetes may cause you to have poor blood circulation to your legs and feet. Because of this, taking care of your feet is very important. Diabetes can cause:  The skin on the feet to get thinner, break more easily, and heal more slowly.  Nerve damage in your legs and feet, which results in decreased feeling. You may not notice minor injuries that could lead to serious problems.  To avoid foot problems:  Check your skin and feet every day for cuts, bruises, redness, blisters, or sores.  Schedule a foot exam with your health care provider once every year. This exam includes: ? Inspecting of the structure and skin of your feet. ? Checking the pulses and sensation in your feet.  Make sure that your health care provider performs a visual foot exam at every medical visit.  Take care of your teeth People with poorly controlled diabetes are more  likely to have gum (periodontal) disease. Diabetes can make periodontal diseases harder to control. If not treated, periodontal diseases can lead to tooth loss. To prevent this:  Brush your teeth twice a day.  Floss at least once a day.  Visit your dentist 2 times a year.  Drink responsibly Limit alcohol intake to no more than 1 drink a day for nonpregnant women and 2 drinks a day for men. One drink equals 12 oz of beer, 5 oz of wine, or 1 oz of hard liquor. It is important to eat food when you drink alcohol to avoid low blood glucose (hypoglycemia). Avoid alcohol if you:  Have a history of alcohol abuse or dependence.  Are pregnant.  Have liver disease, pancreatitis, advanced neuropathy, or severe hypertriglyceridemia.  Lessen stress Living with diabetes can be stressful. When you are experiencing stress, your blood glucose may be affected in two ways:  Stress hormones may cause your blood glucose to rise.  You may be distracted from taking good care of yourself.  Be aware of your stress level and make changes to help you manage challenging situations. To lower your stress levels:  Consider joining a support group.  Do planned relaxation   or meditation.  Do a hobby that you enjoy.  Maintain healthy relationships.  Exercise regularly.  Work with your health care provider or a mental health professional.  Summary  You can take action to prevent or slow down problems that are caused by diabetes (diabetes mellitus). Following your diabetes plan and taking care of yourself can reduce your risk of serious or life-threatening complications.  Follow instructions from your health care providers about managing your diabetes. Your diabetes may be managed by a team of health care providers who can teach you how to care for yourself and can answer questions that you have.  Your health care provider will tell you how often you need medical visits depending on your diabetes management  plan. Keep all follow-up visits as directed. This is important so possible problems can be identified early and complications can be avoided or treated. This information is not intended to replace advice given to you by your health care provider. Make sure you discuss any questions you have with your health care provider. Document Released: 09/17/2010 Document Revised: 09/29/2015 Document Reviewed: 09/29/2015 Elsevier Interactive Patient Education  2018 Elsevier Inc.  

## 2017-07-23 NOTE — Progress Notes (Signed)
Chief Complaint  Patient presents with  . Diabetes    one month f/u.  Needs test strips and sensors refill.  Pt says sensor not taking on arms and she's used 3 in a month    HPI     Diabetes Mellitus: Patient presents for follow up of diabetes. she has been using her freestyle libre and her readings have been 70-170s but on average 118.  She states that she has been eating bread to keep her sugars up.  She states that when they get low she feels a little dizzy.  Since starting [place] she has been having increased vaginal discharge with yeast infection symptoms. Symptoms: vaginal discharge. Evaluation to date has been included: hemoglobin A1C.  Home sugars: BGs have been labile ranging between 118 and 170. Treatment to date: metformin and Farxiga  Wt Readings from Last 3 Encounters:  07/23/17 251 lb (113.9 kg)  06/25/17 250 lb 12.8 oz (113.8 kg)  06/19/17 251 lb 12.8 oz (114.2 kg)   Component     Latest Ref Rng & Units 06/19/2017 07/23/2017  Hemoglobin A1C     4.0 - 5.6 % 11.1 (H) 8.3 (A)  Est. average glucose Bld gHb Est-mCnc     mg/dL 132      No past medical history on file.  Current Outpatient Medications  Medication Sig Dispense Refill  . Continuous Blood Gluc Receiver (FREESTYLE LIBRE 14 DAY READER) DEVI 1 application by Does not apply route daily. E11.9 1 Device 0  . Continuous Blood Gluc Sensor (FREESTYLE LIBRE 14 DAY SENSOR) MISC 1 application by Does not apply route daily. 6 each 4  . etonogestrel-ethinyl estradiol (NUVARING) 0.12-0.015 MG/24HR vaginal ring Insert vaginally and leave in place for 3 consecutive weeks, then remove for 1 week. 1 each 12  . glucose blood (FREESTYLE LITE) test strip Use as instructed  Dx Code E11.9 100 each 12  . metFORMIN (GLUCOPHAGE) 500 MG tablet Take 1 tablet (500 mg total) by mouth 2 (two) times daily with a meal. 180 tablet 3  . oxyCODONE-acetaminophen (ROXICET) 5-325 MG per tablet Take 1 tablet by mouth every 8 (eight) hours as needed for  severe pain. 15 tablet 0  . fluconazole (DIFLUCAN) 150 MG tablet Take 1 tablet (150 mg total) by mouth once for 1 dose. Repeat 2nd dose 3 days after. 2 tablet 1   No current facility-administered medications for this visit.     Allergies:  Allergies  Allergen Reactions  . Belviq [Lorcaserin Hcl] Nausea And Vomiting    Past Surgical History:  Procedure Laterality Date  . ABDOMINOPLASTY  2015    Social History   Socioeconomic History  . Marital status: Married    Spouse name: Not on file  . Number of children: Not on file  . Years of education: Not on file  . Highest education level: Not on file  Occupational History  . Not on file  Social Needs  . Financial resource strain: Not on file  . Food insecurity:    Worry: Not on file    Inability: Not on file  . Transportation needs:    Medical: Not on file    Non-medical: Not on file  Tobacco Use  . Smoking status: Never Smoker  . Smokeless tobacco: Never Used  Substance and Sexual Activity  . Alcohol use: No  . Drug use: No  . Sexual activity: Yes  Lifestyle  . Physical activity:    Days per week: Not on file  Minutes per session: Not on file  . Stress: Not on file  Relationships  . Social connections:    Talks on phone: Not on file    Gets together: Not on file    Attends religious service: Not on file    Active member of club or organization: Not on file    Attends meetings of clubs or organizations: Not on file    Relationship status: Not on file  Other Topics Concern  . Not on file  Social History Narrative  . Not on file    Family History  Problem Relation Age of Onset  . Diabetes Maternal Grandmother   . Hypertension Maternal Grandmother   . Diabetes Paternal Grandfather   . Hypertension Paternal Grandfather   . Cancer Paternal Grandfather   . Hypertension Paternal Grandmother   . Diabetes Paternal Grandmother   . Hypertension Mother      ROS Review of Systems See HPI Constitution: No  fevers or chills No malaise No diaphoresis Skin: No rash or itching Eyes: no blurry vision, no double vision GU: no dysuria or hematuria Neuro: no dizziness or headaches all others reviewed and negative   Objective: Vitals:   07/23/17 1022  BP: 114/79  Pulse: 87  Resp: 17  Temp: 99.6 F (37.6 C)  TempSrc: Oral  SpO2: 98%  Weight: 251 lb (113.9 kg)  Height: 5' 7.32" (1.71 m)    Physical Exam  Constitutional: She is oriented to person, place, and time. She appears well-developed and well-nourished.  HENT:  Head: Normocephalic and atraumatic.  Right Ear: External ear normal.  Left Ear: External ear normal.  Eyes: Conjunctivae and EOM are normal.  Cardiovascular: Normal rate, regular rhythm and normal heart sounds.  No murmur heard. Pulmonary/Chest: Effort normal and breath sounds normal. No stridor. No respiratory distress. She has no wheezes.  Neurological: She is alert and oriented to person, place, and time.  Skin: Skin is warm. Capillary refill takes less than 2 seconds.  Psychiatric: She has a normal mood and affect. Her behavior is normal. Judgment and thought content normal.    Assessment and Plan Pamela Duarte was seen today for diabetes.  Diagnoses and all orders for this visit:  Type 2 diabetes mellitus with complication, without long-term current use of insulin (HCC) -  Discussed diabetes mgmt Discussed hypoglycemia Would stop [place]xiga and monitor sugars Keep metformin the same for now and will increase to keep blood glucose at goal Pneumovax given today -     POCT glycosylated hemoglobin (Hb A1C) -     Microalbumin, urine -     HM Diabetes Foot Exam -     glucose blood (FREESTYLE LITE) test strip; Use as instructed  Dx Code E11.9 -     Fructosamine  Encounter for screening for HIV -     HIV antibody  Vaginal yeast infection- empiric diflucan   Other orders -     Pneumococcal polysaccharide vaccine 23-valent greater than or equal to 2yo  subcutaneous/IM -     Continuous Blood Gluc Sensor (FREESTYLE LIBRE 14 DAY SENSOR) MISC; 1 application by Does not apply route daily. -     fluconazole (DIFLUCAN) 150 MG tablet; Take 1 tablet (150 mg total) by mouth once for 1 dose. Repeat 2nd dose 3 days after.  A total of 25 minutes were spent face-to-face with the patient during this encounter and over half of that time was spent on counseling and coordination of care.    Pamela Duarte

## 2017-07-24 LAB — FRUCTOSAMINE: Fructosamine: 303 umol/L — ABNORMAL HIGH (ref 0–285)

## 2017-07-24 LAB — HIV ANTIBODY (ROUTINE TESTING W REFLEX): HIV Screen 4th Generation wRfx: NONREACTIVE

## 2017-07-24 LAB — MICROALBUMIN, URINE: MICROALBUM., U, RANDOM: 16.7 ug/mL

## 2017-07-26 ENCOUNTER — Encounter: Payer: Self-pay | Admitting: Family Medicine

## 2017-07-26 ENCOUNTER — Other Ambulatory Visit: Payer: Self-pay

## 2017-07-26 MED ORDER — GLUCOSE BLOOD VI STRP: ORAL_STRIP | 12 refills | Status: DC

## 2017-07-26 NOTE — Telephone Encounter (Signed)
Specific Freestyle Libre test strips not available to choose on menu of items Chose Freestyle Test strips and entered on Sig to dispense the [employer] Group[employer]estyle Libre test strips. Sent

## 2017-07-27 ENCOUNTER — Telehealth: Payer: Self-pay | Admitting: Family Medicine

## 2017-07-27 NOTE — Telephone Encounter (Signed)
Test strips sent in yesterday 7/14.

## 2017-07-27 NOTE — Telephone Encounter (Signed)
Copied from CRM [phone]#[id number]. Topic: Quick Communication - Rx Refill/Question >> Jul 08, 2017  4:12 PM Cipriano BunkerLambe, Annette S wrote: Medication:   needing test strips for FREESTYLE LIBRE 14 DAY READER) She is needing to double check her blood sugar as it saying it is low.    Has the patient contacted their pharmacy? No. (Agent: If no, request that the patient contact the pharmacy for the refill.) (Agent: If yes, when and what did the pharmacy advise?)  Preferred Pharmacy (with phone number or street name):   [facility] [address] - [place], [place] - [id number] [place] [address] Phone: [phone] Fax: [phone]    Agent: Please be advised that RX refills may take up to 3 business days. We ask that you follow-up with your pharmacy.

## 2017-08-08 ENCOUNTER — Encounter: Payer: Self-pay | Admitting: Family Medicine

## 2017-08-25 ENCOUNTER — Other Ambulatory Visit: Payer: Self-pay

## 2017-08-25 ENCOUNTER — Ambulatory Visit (INDEPENDENT_AMBULATORY_CARE_PROVIDER_SITE_OTHER): Payer: BLUE CROSS/BLUE SHIELD | Admitting: Family Medicine

## 2017-08-25 ENCOUNTER — Encounter: Payer: Self-pay | Admitting: Family Medicine

## 2017-08-25 VITALS — BP 123/86 | HR 98 | Temp 99.1°F | Resp 17 | Ht 67.32 in | Wt 254.2 lb

## 2017-08-25 DIAGNOSIS — Z3169 Encounter for other general counseling and advice on procreation: Secondary | ICD-10-CM | POA: Diagnosis not present

## 2017-08-25 DIAGNOSIS — E118 Type 2 diabetes mellitus with unspecified complications: Secondary | ICD-10-CM

## 2017-08-25 MED ORDER — PRENATAL COMPLETE 14-0.4 MG PO TABS: 1.0000 | ORAL_TABLET | Freq: Every day | ORAL | 11 refills | Status: DC

## 2017-08-25 NOTE — Progress Notes (Signed)
Chief Complaint  Patient presents with  . Blood Sugar Problem    x 1 month f/u    HPI  Pt reports that she has been on vacation so she had some more carbs that she normally consumes She states that she has been having fasting glucose that is 140-180 She denies hypoglycemia She also denies any weakness, diarrhea, nausea or fatigue She states that she stopped using the freestyle libre and is using a finger stick monitoring system   Lab Results  Component Value Date   HGBA1C 8.3 (A) 07/23/2017   Female infertility She is hoping to conceive and is off the [place]iga and her sugars are improved She stopped her nuvaring Patient's last menstrual period was 08/04/2017. She is not on a prenatal vitamin   No past medical history on file.  Current Outpatient Medications  Medication Sig Dispense Refill  . Continuous Blood Gluc Receiver (FREESTYLE LIBRE 14 DAY READER) DEVI 1 application by Does not apply route daily. E11.9 1 Device 0  . Continuous Blood Gluc Sensor (FREESTYLE LIBRE 14 DAY SENSOR) MISC 1 application by Does not apply route daily. 6 each 4  . etonogestrel-ethinyl estradiol (NUVARING) 0.12-0.015 MG/24HR vaginal ring Insert vaginally and leave in place for 3 consecutive weeks, then remove for 1 week. 1 each 12  . glucose blood (FREESTYLE TEST STRIPS) test strip Use as instructed Dx code: E11.9-Dispense Freestyle Libre test strips 100 each 12  . metFORMIN (GLUCOPHAGE) 500 MG tablet Take 1 tablet (500 mg total) by mouth 2 (two) times daily with a meal. 180 tablet 3  . oxyCODONE-acetaminophen (ROXICET) 5-325 MG per tablet Take 1 tablet by mouth every 8 (eight) hours as needed for severe pain. 15 tablet 0  . Prenatal Vit-Fe Fumarate-FA (PRENATAL COMPLETE) 14-0.4 MG TABS Take 1 tablet by mouth daily. 60 each 11   No current facility-administered medications for this visit.     Allergies:  Allergies  Allergen Reactions  . Belviq [Lorcaserin Hcl] Nausea And Vomiting     Past Surgical History:  Procedure Laterality Date  . ABDOMINOPLASTY  2015    Social History   Socioeconomic History  . Marital status: Married    Spouse name: Not on file  . Number of children: Not on file  . Years of education: Not on file  . Highest education level: Not on file  Occupational History  . Not on file  Social Needs  . Financial resource strain: Not on file  . Food insecurity:    Worry: Not on file    Inability: Not on file  . Transportation needs:    Medical: Not on file    Non-medical: Not on file  Tobacco Use  . Smoking status: Never Smoker  . Smokeless tobacco: Never Used  Substance and Sexual Activity  . Alcohol use: No  . Drug use: No  . Sexual activity: Yes  Lifestyle  . Physical activity:    Days per week: Not on file    Minutes per session: Not on file  . Stress: Not on file  Relationships  . Social connections:    Talks on phone: Not on file    Gets together: Not on file    Attends religious service: Not on file    Active member of club or organization: Not on file    Attends meetings of clubs or organizations: Not on file    Relationship status: Not on file  Other Topics Concern  . Not on file  Social History Narrative  . Not on file    Family History  Problem Relation Age of Onset  . Diabetes Maternal Grandmother   . Hypertension Maternal Grandmother   . Diabetes Paternal Grandfather   . Hypertension Paternal Grandfather   . Cancer Paternal Grandfather   . Hypertension Paternal Grandmother   . Diabetes Paternal Grandmother   . Hypertension Mother      ROS Review of Systems See HPI Constitution: No fevers or chills No malaise No diaphoresis Skin: No rash or itching Eyes: no blurry vision, no double vision GU: no dysuria or hematuria Neuro: no dizziness or headaches all others reviewed and negative   Objective: Vitals:   08/25/17 0913  BP: 123/86  Pulse: 98  Resp: 17  Temp: 99.1 F (37.3 C)  TempSrc: Oral   SpO2: 96%  Weight: 254 lb 3.2 oz (115.3 kg)  Height: 5' 7.32" (1.71 m)    Physical Exam  Constitutional: She is oriented to person, place, and time. She appears well-developed and well-nourished.  HENT:  Head: Normocephalic and atraumatic.  Eyes: Conjunctivae and EOM are normal.  Neck: Normal range of motion. Neck supple.  Cardiovascular: Normal rate, regular rhythm and normal heart sounds.  No murmur heard. Pulmonary/Chest: Effort normal and breath sounds normal. No stridor. No respiratory distress. She has no wheezes.  Neurological: She is alert and oriented to person, place, and time.  Skin: Skin is warm. Capillary refill takes less than 2 seconds.  Psychiatric: She has a normal mood and affect. Her behavior is normal. Judgment and thought content normal.     Assessment and Plan Pamela Duarte was seen today for blood sugar problem.  Diagnoses and all orders for this visit:  Type 2 diabetes mellitus with complication, without long-term current use of insulin (HCC)- continue metformin as instructed Will have pt return to check a1c Advised at least six weeks interval  -     HM Diabetes Foot Exam -     Cancel: Hemoglobin A1c -     Hemoglobin A1c; Future  Infertility counseling  Start prenatal vitamin Advised that her husband get semen analysis -     Prenatal Vit-Fe Fumarate-FA (PRENATAL COMPLETE) 14-0.4 MG TABS; Take 1 tablet by mouth daily.     Pamela Duarte

## 2017-08-25 NOTE — Patient Instructions (Signed)
     IF you received an x-ray today, you will receive an invoice from [facility]. Please contact [facility] at [phone] with questions or concerns regarding your invoice.   IF you received labwork today, you will receive an invoice from [facility]. Please contact [facility] at [phone] with questions or concerns regarding your invoice.   Our billing staff will not be able to assist you with questions regarding bills from these companies.  You will be contacted with the lab results as soon as they are available. The fastest way to get your results is to activate your My Chart account. Instructions are located on the last page of this paperwork. If you have not heard from us regarding the results in 2 weeks, please contact this office.     

## 2017-09-07 ENCOUNTER — Other Ambulatory Visit: Payer: Self-pay | Admitting: Family Medicine

## 2017-09-07 ENCOUNTER — Ambulatory Visit (INDEPENDENT_AMBULATORY_CARE_PROVIDER_SITE_OTHER): Payer: BLUE CROSS/BLUE SHIELD | Admitting: Family Medicine

## 2017-09-07 DIAGNOSIS — E118 Type 2 diabetes mellitus with unspecified complications: Secondary | ICD-10-CM | POA: Diagnosis not present

## 2017-09-07 LAB — HEMOGLOBIN A1C
Est. average glucose Bld gHb Est-mCnc: 171 mg/dL
HEMOGLOBIN A1C: 7.6 % — AB (ref 4.8–5.6)

## 2017-09-16 ENCOUNTER — Encounter: Payer: Self-pay | Admitting: Family Medicine

## 2017-09-17 ENCOUNTER — Ambulatory Visit (INDEPENDENT_AMBULATORY_CARE_PROVIDER_SITE_OTHER): Payer: BLUE CROSS/BLUE SHIELD | Admitting: Emergency Medicine

## 2017-09-17 ENCOUNTER — Encounter: Payer: Self-pay | Admitting: Emergency Medicine

## 2017-09-17 ENCOUNTER — Other Ambulatory Visit: Payer: Self-pay

## 2017-09-17 VITALS — BP 106/74 | HR 92 | Temp 98.9°F | Resp 16 | Ht 66.75 in | Wt 252.4 lb

## 2017-09-17 DIAGNOSIS — S91209A Unspecified open wound of unspecified toe(s) with damage to nail, initial encounter: Secondary | ICD-10-CM | POA: Diagnosis not present

## 2017-09-17 DIAGNOSIS — S99922A Unspecified injury of left foot, initial encounter: Secondary | ICD-10-CM

## 2017-09-17 MED ORDER — FLUCONAZOLE 150 MG PO TABS: 150.0000 mg | ORAL_TABLET | Freq: Once | ORAL | 0 refills | Status: AC

## 2017-09-17 MED ORDER — CEFADROXIL 500 MG PO CAPS: 500.0000 mg | ORAL_CAPSULE | Freq: Two times a day (BID) | ORAL | 0 refills | Status: AC

## 2017-09-17 MED ORDER — MUPIROCIN 2 % EX OINT: TOPICAL_OINTMENT | CUTANEOUS | 1 refills | Status: DC

## 2017-09-17 NOTE — Progress Notes (Signed)
Pamela Duarte 32 y.o.   Chief Complaint  Patient presents with  . Toe Injury    LEFT  Great toe x 2 days    HISTORY OF PRESENT ILLNESS: This is a 32 y.o. female sustained left great toe injury 2 days ago with partial nail evulsion.  Concerned about infection.  HPI   Prior to Admission medications   Medication Sig Start Date End Date Taking? Authorizing Provider  metFORMIN (GLUCOPHAGE) 500 MG tablet Take 1 tablet (500 mg total) by mouth 2 (two) times daily with a meal. 06/25/17  Yes Stallings, Zoe A, MD  oxyCODONE-acetaminophen (ROXICET) 5-325 MG per tablet Take 1 tablet by mouth every 8 (eight) hours as needed for severe pain. 04/12/14  Yes Elvina Sidle, MD  Prenatal Vit-Fe Fumarate-FA (PRENATAL COMPLETE) 14-0.4 MG TABS Take 1 tablet by mouth daily. 08/25/17  Yes Doristine Bosworth, MD  Continuous Blood Gluc Receiver (FREESTYLE LIBRE 14 DAY READER) DEVI 1 application by Does not apply route daily. E11.9 06/25/17   Doristine Bosworth, MD  Continuous Blood Gluc Sensor (FREESTYLE LIBRE 14 DAY SENSOR) MISC 1 application by Does not apply route daily. 07/23/17   Doristine Bosworth, MD  glucose blood (FREESTYLE TEST STRIPS) test strip Use as instructed Dx code: E11.9-Dispense Freestyle Libre test strips 07/26/17   Doristine Bosworth, MD    Allergies  Allergen Reactions  . Belviq [Lorcaserin Hcl] Nausea And Vomiting    Patient Active Problem List   Diagnosis Date Noted  . Newly diagnosed diabetes (HCC) 06/26/2017  . Infertility counseling 06/26/2017  . Class 2 obesity due to excess calories without serious comorbidity with body mass index (BMI) of 39.0 to 39.9 in adult 06/26/2017    No past medical history on file.  Past Surgical History:  Procedure Laterality Date  . ABDOMINOPLASTY  2015    Social History   Socioeconomic History  . Marital status: Married    Spouse name: Not on file  . Number of children: Not on file  . Years of education: Not on file  . Highest education  level: Not on file  Occupational History  . Not on file  Social Needs  . Financial resource strain: Not on file  . Food insecurity:    Worry: Not on file    Inability: Not on file  . Transportation needs:    Medical: Not on file    Non-medical: Not on file  Tobacco Use  . Smoking status: Never Smoker  . Smokeless tobacco: Never Used  Substance and Sexual Activity  . Alcohol use: No  . Drug use: No  . Sexual activity: Yes  Lifestyle  . Physical activity:    Days per week: Not on file    Minutes per session: Not on file  . Stress: Not on file  Relationships  . Social connections:    Talks on phone: Not on file    Gets together: Not on file    Attends religious service: Not on file    Active member of club or organization: Not on file    Attends meetings of clubs or organizations: Not on file    Relationship status: Not on file  . Intimate partner violence:    Fear of current or ex partner: Not on file    Emotionally abused: Not on file    Physically abused: Not on file    Forced sexual activity: Not on file  Other Topics Concern  . Not on file  Social History  Narrative  . Not on file    Family History  Problem Relation Age of Onset  . Diabetes Maternal Grandmother   . Hypertension Maternal Grandmother   . Diabetes Paternal Grandfather   . Hypertension Paternal Grandfather   . Cancer Paternal Grandfather   . Hypertension Paternal Grandmother   . Diabetes Paternal Grandmother   . Hypertension Mother      Review of Systems  Constitutional: Negative.  Negative for chills and fever.  Respiratory: Negative for shortness of breath.   Gastrointestinal: Negative for nausea and vomiting.  Neurological: Negative for dizziness and headaches.  Endo/Heme/Allergies: Negative.   All other systems reviewed and are negative.   Vitals:   09/17/17 1419  BP: 98/70  Pulse: 92  Resp: 16  Temp: 98.9 F (37.2 C)  SpO2: 97%    Physical Exam  Constitutional: She is  oriented to person, place, and time. She appears well-developed and well-nourished.  HENT:  Head: Normocephalic and atraumatic.  Eyes: Pupils are equal, round, and reactive to light.  Neck: Normal range of motion.  Cardiovascular: Normal rate.  Pulmonary/Chest: Effort normal.  Musculoskeletal: Normal range of motion.  Neurological: She is alert and oriented to person, place, and time.  Skin: Skin is warm and dry. Capillary refill takes less than 2 seconds.  Left big toe: Partially avulsed distal nail.  Well attached proximal.  No significant swelling or erythema.  No visible discharge.  Psychiatric: She has a normal mood and affect. Her behavior is normal.     ASSESSMENT & PLAN: Pamela Duarte was seen today for toe injury.  Diagnoses and all orders for this visit:  Avulsion of toenail, initial encounter Comments: partial Orders: -     mupirocin ointment (BACTROBAN) 2 %; Sig to affected area twice a day x 3 days. -     cefadroxil (DURICEF) 500 MG capsule; Take 1 capsule (500 mg total) by mouth 2 (two) times daily for 7 days. -     fluconazole (DIFLUCAN) 150 MG tablet; Take 1 tablet (150 mg total) by mouth once for 1 dose.  Injury of toe on left foot, initial encounter    Patient Instructions       If you have lab work done today you will be contacted with your lab results within the next 2 weeks.  If you have not heard from [place] then please contact us. The fastest way to get your results is to register for My Chart.   IF you received an x-ray today, you will receive an invoice from [facility]. Please contact [facility] at [phone] with questions or concerns regarding your invoice.   IF you received labwork today, you will receive an invoice from [place] [facility]. Please contact [facility] at [phone] with questions or concerns regarding your invoice.   Our billing staff will not be able to assist you with questions regarding bills from these companies.  You  will be contacted with the lab results as soon as they are available. The fastest way to get your results is to activate your My Chart account. Instructions are located on the last page of this paperwork. If you have not heard from [place] regarding the results in 2 weeks, please contact this office.     Nail Avulsion Nail avulsion is when a nail tears away from the nail bed due to an accident or an injury. Nail avulsion can be painful. Your finger or toe may bleed a lot, and you may have some pain, redness, throbbing, and swelling  while it heals. Your nail will grow back within several months. Once it grows back, it might not look the same. This may happen even after taking good care of it. Follow these instructions at home: Wound care   Clean any dirt and debris from the wound.  If you notice bleeding, press gently on the nailbed with a gauze pad. Do this for 15 minutes.  If a health care provider closed your wound with stitches (sutures), leave them in place. They may need to stay in place for 2 weeks or longer. You may need to see your health care provider to have them removed.  Keep the wound dry for 48 hours. After 48 hours have passed, lightly wash the finger or toe in warm, soapy water 2-3 times a day. This helps to reduce pain and swelling and prevent infection. Dressing Care  Cover the wound with a clean gauze bandage (dressing). You may be able to stop wearing a dressing after 2?7 days.  Wash your hands with soap and water before you change your dressing. If soap and water are not available, use hand sanitizer.  Change the dressing once or twice a day. Always change the dressing: ? If the dressing gets wet or dirty. ? After washing your finger or toe. Medicine  Take over-the-counter pain medicine as needed. Do not take aspirin or products containing aspirin unless directed by your health care provider. These products can increase bleeding.  If you were prescribed an antibiotic  medicine, take it or apply it as told by your health care provider. Do not stop taking or using the antibiotic even if you start to feel better. General instructions  Keep the hand or foot with the nail injury raised above the level of your heart as much as possible. This helps to reduce pain and swelling.  Move the toe or finger often to avoid stiffness.  Do not smoke. Smoking can delay healing. If you need help quitting, talk to your health care provider. Contact a health care provider if:  You have swelling or pain that gets worse instead of better.  You have fluid, blood, or pus coming from your wound.  Your wound smells bad. Get help right away if:  You have bleeding that does not stop, even when you apply pressure to the wound.  You have a temperature that is higher than 104F (40C).  You cannot move your fingers or toes.  The affected finger or toe looks white or black. This information is not intended to replace advice given to you by your health care provider. Make sure you discuss any questions you have with your health care provider. Document Released: 02/07/2004 Document Revised: 08/30/2015 Document Reviewed: 05/17/2014 Elsevier Interactive Patient Education  2018 [employer]       Edwina Barth, MD [facility]

## 2017-09-17 NOTE — Patient Instructions (Addendum)
If you have lab work done today you will be contacted with your lab results within the next 2 weeks.  If you have not heard from [place] then please contact us. The fastest way to get your results is to register for My Chart.   IF you received an x-ray today, you will receive an invoice from [facility]. Please contact [facility] at [phone] with questions or concerns regarding your invoice.   IF you received labwork today, you will receive an invoice from [facility]. Please contact [facility] at [phone] with questions or concerns regarding your invoice.   Our billing staff will not be able to assist you with questions regarding bills from these companies.  You will be contacted with the lab results as soon as they are available. The fastest way to get your results is to activate your My Chart account. Instructions are located on the last page of this paperwork. If you have not heard from [place] regarding the results in 2 weeks, please contact this office.     Nail Avulsion Nail avulsion is when a nail tears away from the nail bed due to an accident or an injury. Nail avulsion can be painful. Your finger or toe may bleed a lot, and you may have some pain, redness, throbbing, and swelling while it heals. Your nail will grow back within several months. Once it grows back, it might not look the same. This may happen even after taking good care of it. Follow these instructions at home: Wound care   Clean any dirt and debris from the wound.  If you notice bleeding, press gently on the nailbed with a gauze pad. Do this for 15 minutes.  If a health care provider closed your wound with stitches (sutures), leave them in place. They may need to stay in place for 2 weeks or longer. You may need to see your health care provider to have them removed.  Keep the wound dry for 48 hours. After 48 hours have passed, lightly wash the finger or toe in warm, soapy water 2-3 times a day.  This helps to reduce pain and swelling and prevent infection. Dressing Care  Cover the wound with a clean gauze bandage (dressing). You may be able to stop wearing a dressing after 2?7 days.  Wash your hands with soap and water before you change your dressing. If soap and water are not available, use hand sanitizer.  Change the dressing once or twice a day. Always change the dressing: ? If the dressing gets wet or dirty. ? After washing your finger or toe. Medicine  Take over-the-counter pain medicine as needed. Do not take aspirin or products containing aspirin unless directed by your health care provider. These products can increase bleeding.  If you were prescribed an antibiotic medicine, take it or apply it as told by your health care provider. Do not stop taking or using the antibiotic even if you start to feel better. General instructions  Keep the hand or foot with the nail injury raised above the level of your heart as much as possible. This helps to reduce pain and swelling.  Move the toe or finger often to avoid stiffness.  Do not smoke. Smoking can delay healing. If you need help quitting, talk to your health care provider. Contact a health care provider if:  You have swelling or pain that gets worse instead of better.  You have fluid, blood, or pus coming from your wound.  Your  wound smells bad. Get help right away if:  You have bleeding that does not stop, even when you apply pressure to the wound.  You have a temperature that is higher than 104F (40C).  You cannot move your fingers or toes.  The affected finger or toe looks white or black. This information is not intended to replace advice given to you by your health care provider. Make sure you discuss any questions you have with your health care provider. Document Released: 02/07/2004 Document Revised: 08/30/2015 Document Reviewed: 05/17/2014 Elsevier Interactive Patient Education  [employer].

## 2017-10-19 NOTE — Progress Notes (Signed)
lab

## 2017-11-27 DIAGNOSIS — E282 Polycystic ovarian syndrome: Secondary | ICD-10-CM | POA: Diagnosis not present

## 2018-05-21 ENCOUNTER — Other Ambulatory Visit: Payer: Self-pay

## 2018-05-21 DIAGNOSIS — E118 Type 2 diabetes mellitus with unspecified complications: Secondary | ICD-10-CM

## 2018-05-26 ENCOUNTER — Telehealth: Payer: Self-pay | Admitting: Family Medicine

## 2018-06-03 ENCOUNTER — Ambulatory Visit: Payer: BLUE CROSS/BLUE SHIELD | Admitting: Family Medicine

## 2018-06-15 ENCOUNTER — Encounter: Payer: BLUE CROSS/BLUE SHIELD | Admitting: Family Medicine

## 2018-06-23 NOTE — Telephone Encounter (Signed)
RESOLVED. 

## 2018-06-25 ENCOUNTER — Encounter: Payer: BLUE CROSS/BLUE SHIELD | Admitting: Family Medicine

## 2018-06-25 ENCOUNTER — Encounter: Payer: Self-pay | Admitting: Family Medicine

## 2018-06-25 ENCOUNTER — Other Ambulatory Visit: Payer: Self-pay

## 2018-06-25 ENCOUNTER — Other Ambulatory Visit (HOSPITAL_COMMUNITY)
Admission: RE | Admit: 2018-06-25 | Discharge: 2018-06-25 | Disposition: A | Discharge status: Home / Self Care | Payer: BC Managed Care – PPO | Source: Ambulatory Visit | Attending: Family Medicine | Admitting: Family Medicine

## 2018-06-25 ENCOUNTER — Ambulatory Visit (INDEPENDENT_AMBULATORY_CARE_PROVIDER_SITE_OTHER): Payer: BC Managed Care – PPO | Admitting: Family Medicine

## 2018-06-25 VITALS — BP 133/85 | HR 76 | Temp 98.2°F | Resp 18 | Ht 66.75 in | Wt 255.4 lb

## 2018-06-25 DIAGNOSIS — Z113 Encounter for screening for infections with a predominantly sexual mode of transmission: Secondary | ICD-10-CM | POA: Diagnosis not present

## 2018-06-25 DIAGNOSIS — Z0001 Encounter for general adult medical examination with abnormal findings: Secondary | ICD-10-CM

## 2018-06-25 DIAGNOSIS — E118 Type 2 diabetes mellitus with unspecified complications: Secondary | ICD-10-CM | POA: Diagnosis not present

## 2018-06-25 DIAGNOSIS — Z0000 Encounter for general adult medical examination without abnormal findings: Secondary | ICD-10-CM

## 2018-06-25 DIAGNOSIS — Z114 Encounter for screening for human immunodeficiency virus [HIV]: Secondary | ICD-10-CM | POA: Diagnosis not present

## 2018-06-25 LAB — POCT GLYCOSYLATED HEMOGLOBIN (HGB A1C): Hemoglobin A1C: 6.1 % — AB (ref 4.0–5.6)

## 2018-06-25 LAB — LIPID PANEL

## 2018-06-25 NOTE — Patient Instructions (Signed)
° ° ° °  If you have lab work done today you will be contacted with your lab results within the next 2 weeks.  If you have not heard from us then please contact us. The fastest way to get your results is to register for My Chart. ° ° °IF you received an x-ray today, you will receive an invoice from [facility]. Please contact [place] [facility] at [phone] with questions or concerns regarding your invoice.  ° °IF you received labwork today, you will receive an invoice from [facility]. Please contact [facility] at [phone] with questions or concerns regarding your invoice.  ° °Our billing staff will not be able to assist you with questions regarding bills from these companies. ° °You will be contacted with the lab results as soon as they are available. The fastest way to get your results is to activate your My Chart account. Instructions are located on the last page of this paperwork. If you have not heard from us regarding the results in 2 weeks, please contact this office. °  ° ° ° °

## 2018-06-25 NOTE — Progress Notes (Signed)
Chief Complaint  Patient presents with  . Annual Exam    with STD testing and no pap    Subjective:  Pamela Duarte is a 33 y.o. female here for a health maintenance visit.  Patient is established pt  Patient reports that she has diabetes but that her numbers sometimes frustrate her because they can be as high as 150 She works nights Lab Results  Component Value Date   HGBA1C 6.1 (A) 06/25/2018   She plans on discussing ivf with her gynecologist  Lab Results  Component Value Date   HGBA1C 7.6 (H) 09/07/2017     Patient Active Problem List   Diagnosis Date Noted  . Newly diagnosed diabetes (HCC) 06/26/2017  . Infertility counseling 06/26/2017  . Class 2 obesity due to excess calories without serious comorbidity with body mass index (BMI) of 39.0 to 39.9 in adult 06/26/2017    Past Medical History:  Diagnosis Date  . Diabetes mellitus without complication [facility](HCC)     Past Surgical History:  Procedure Laterality Date  . ABDOMINOPLASTY  2015  . COSMETIC SURGERY       Outpatient Medications Prior to Visit  Medication Sig Dispense Refill  . Continuous Blood Gluc Receiver (FREESTYLE LIBRE 14 DAY READER) DEVI 1 application by Does not apply route daily. E11.9 1 Device 0  . Continuous Blood Gluc Sensor (FREESTYLE LIBRE 14 DAY SENSOR) MISC 1 application by Does not apply route daily. 6 each 4  . glucose blood (FREESTYLE TEST STRIPS) test strip Use as instructed Dx code: E11.9-Dispense Freestyle Libre test strips 100 each 12  . metFORMIN (GLUCOPHAGE) 500 MG tablet Take 1 tablet (500 mg total) by mouth 2 (two) times daily with a meal. (Patient taking differently: Take 1,000 mg by mouth 2 (two) times daily with a meal. ) 180 tablet 3  . oxyCODONE-acetaminophen (ROXICET) 5-325 MG per tablet Take 1 tablet by mouth every 8 (eight) hours as needed for severe pain. 15 tablet 0  . Prenatal Vit-Fe Fumarate-FA (PRENATAL COMPLETE) 14-0.4 MG TABS Take 1 tablet by mouth daily. 60 each 11   . mupirocin ointment (BACTROBAN) 2 % Sig to affected area twice a day x 3 days. 22 g 1   No facility-administered medications prior to visit.     Allergies  Allergen Reactions  . Belviq [Lorcaserin Hcl] Nausea And Vomiting     Family History  Problem Relation Age of Onset  . Diabetes Maternal Grandmother   . Hypertension Maternal Grandmother   . Diabetes Paternal Grandfather   . Hypertension Paternal Grandfather   . Cancer Paternal Grandfather   . Hypertension Paternal Grandmother   . Diabetes Paternal Grandmother   . Hypertension Mother      Health Habits: Dental Exam: up to date Eye Exam: up to date Exercise:  times/week on average Current exercise activities: walking/running Diet:   Social History   Socioeconomic History  . Marital status: Married    Spouse name: Not on file  . Number of children: Not on file  . Years of education: Not on file  . Highest education level: Not on file  Occupational History  . Not on file  Social Needs  . Financial resource strain: Not on file  . Food insecurity    Worry: Not on file    Inability: Not on file  . Transportation needs    Medical: Not on file    Non-medical: Not on file  Tobacco Use  . Smoking status: Never Smoker  . Smokeless  tobacco: Never Used  Substance and Sexual Activity  . Alcohol use: No  . Drug use: No  . Sexual activity: Yes  Lifestyle  . Physical activity    Days per week: Not on file    Minutes per session: Not on file  . Stress: Not on file  Relationships  . Social Herbalist on phone: Not on file    Gets together: Not on file    Attends religious service: Not on file    Active member of club or organization: Not on file    Attends meetings of clubs or organizations: Not on file    Relationship status: Not on file  . Intimate partner violence    Fear of current or ex partner: Not on file    Emotionally abused: Not on file    Physically abused: Not on file    Forced sexual  activity: Not on file  Other Topics Concern  . Not on file  Social History Narrative  . Not on file   Social History   Substance and Sexual Activity  Alcohol Use No   Social History   Tobacco Use  Smoking Status Never Smoker  Smokeless Tobacco Never Used   Social History   Substance and Sexual Activity  Drug Use No    GYN: Sexual Health Menstrual status: regular menses LMP: No LMP recorded. Last pap smear: see HM section History of abnormal pap smears:  Sexually active:  with female partner Current contraception: none  Health Maintenance: See under health Maintenance activity for review of completion dates as well. Immunization History  Administered Date(s) Administered  . HPV Quadrivalent 10/06/2011  . Influenza-Unspecified 10/08/2016  . Meningococcal Conjugate 10/06/2011  . Pneumococcal Polysaccharide-23 07/23/2017      Depression Screen-PHQ2/9 Depression screen [facility] 2/9 06/25/2018 09/17/2017 08/25/2017 07/23/2017 07/23/2017  Decreased Interest 0 0 0 0 0  Down, Depressed, Hopeless 0 0 0 0 0  PHQ - 2 Score 0 0 0 0 0     Depression Severity and Treatment Recommendations:  0-4= None  5-9= Mild / Treatment: Support, educate to call if worse; return in one month  10-14= Moderate / Treatment: Support, watchful waiting; Antidepressant or Psycotherapy  15-19= Moderately severe / Treatment: Antidepressant OR Psychotherapy  >= 20 = Major depression, severe / Antidepressant AND Psychotherapy    Review of Systems   ROS  See HPI for ROS as well.   Review of Systems  Constitutional: Negative for activity change, appetite change, chills and fever.  HENT: Negative for congestion, nosebleeds, trouble swallowing and voice change.   Respiratory: Negative for cough, shortness of breath and wheezing.   Gastrointestinal: Negative for diarrhea, nausea and vomiting.  Genitourinary: Negative for difficulty urinating, dysuria, flank pain and hematuria.  Musculoskeletal: Negative  for back pain, joint swelling and neck pain.  Neurological: Negative for dizziness, speech difficulty, light-headedness and numbness.  See HPI. All other review of systems negative.    Objective:   Vitals:   06/25/18 0901  BP: 133/85  Pulse: 76  Resp: 18  Temp: 98.2 F (36.8 C)  TempSrc: Oral  SpO2: 97%  Weight: 255 lb 6.4 oz (115.8 kg)  Height: 5' 6.75" (1.695 m)    Body mass index is 40.3 kg/m.  Physical Exam  BP 133/85 (BP Location: Left Arm, Patient Position: Sitting, Cuff Size: Large)   Pulse 76   Temp 98.2 F (36.8 C) (Oral)   Resp 18   Ht 5' 6.75" (1.695 m)  Wt 255 lb 6.4 oz (115.8 kg)   SpO2 97%   BMI 40.30 kg/m  Chaperone present Wt Readings from Last 3 Encounters:  06/25/18 255 lb 6.4 oz (115.8 kg)  09/17/17 252 lb 6.4 oz (114.5 kg)  08/25/17 254 lb 3.2 oz (115.3 kg)    General Appearance:    Alert, cooperative, no distress, appears stated age  Head:    Normocephalic, without obvious abnormality, atraumatic  Eyes:    PERRL, conjunctiva/corneas clear, EOM's intact  Ears:    Normal TM's and external ear canals, both ears  Nose:   Nares normal, septum midline, mucosa normal, no drainage    or sinus tenderness  Throat:   Lips, mucosa, and tongue normal; teeth and gums normal  Neck:   Supple, symmetrical, trachea midline, no adenopathy;    thyroid:  no enlargement/tenderness/nodules  Back:     Symmetric, no curvature, ROM normal, no CVA tenderness  Lungs:     Clear to auscultation bilaterally, respirations unlabored  Chest Wall:    No tenderness or deformity   Heart:    Regular rate and rhythm, S1 and S2 normal, no murmur, rub   or gallop  Breast Exam:    No tenderness, masses, or nipple abnormality  Abdomen:     Soft, non-tender, bowel sounds active all four quadrants,    no masses, no organomegaly  Genitalia:  deferred  Rectal:  deferred  Extremities:   Extremities normal, atraumatic, no cyanosis or edema  Pulses:   2+ and symmetric all  extremities  Skin:   Skin color, texture, turgor normal, no rashes or lesions  Lymph nodes:   Cervical, supraclavicular, and axillary nodes normal  Neurologic:   CNII-XII intact, normal strength, sensation and reflexes    throughout       Assessment/Plan:   Patient was seen for a health maintenance exam.  Counseled the patient on health maintenance issues. Reviewed her health mainteance schedule and ordered appropriate tests (see orders.) Counseled on regular exercise and weight management. Recommend regular eye exams and dental cleaning.   The following issues were addressed today for health maintenance:   [name] was seen today for annual exam.  Diagnoses and all orders for this visit:  Encounter for health maintenance examination in adult-Women's Health Maintenance Plan Advised monthly breast exam and annual mammogram Advised dental exam every six months Discussed stress management Discussed pap smear screening guidelines    Type 2 diabetes mellitus with complication, without long-term current use of insulin (HCC)-  Diabetes well controlled -     Hemoglobin A1c -     Comprehensive metabolic panel -     Comprehensive metabolic panel -     Lipid panel -     TSH -     Microalbumin, urine  Encounter for screening for HIV -     HIV Antibody (routine testing w rflx)  Screen for STD (sexually transmitted disease) -     HIV Antibody (routine testing w rflx) -     RPR -     GC/Chlamydia probe amp ([place])not at [facility]  Severe obesity - stable weight, continue healthy diet and exercise  No follow-ups on file.    Body mass index is 40.3 kg/m.:  Discussed the patient's BMI with patient. The BMI body mass index is 40.3 kg/m.     No future appointments.  Patient Instructions       If you have lab work done today you will be contacted with your lab results  within the next 2 weeks.  If you have not heard from us then please contact us. The fastest way to get  your results is to register for My Chart.   IF you received an x-ray today, you will receive an invoice from [facility]. Please contact [facility] at [phone] with questions or concerns regarding your invoice.   IF you received labwork today, you will receive an invoice from [facility]. Please contact [facility] at [phone] with questions or concerns regarding your invoice.   Our billing staff will not be able to assist you with questions regarding bills from these companies.  You will be contacted with the lab results as soon as they are available. The fastest way to get your results is to activate your My Chart account. Instructions are located on the last page of this paperwork. If you have not heard from us regarding the results in 2 weeks, please contact this office.

## 2018-06-26 ENCOUNTER — Encounter: Payer: Self-pay | Admitting: Family Medicine

## 2018-06-26 LAB — COMPREHENSIVE METABOLIC PANEL
ALT: 23 IU/L (ref 0–32)
AST: 18 IU/L (ref 0–40)
Albumin/Globulin Ratio: 1.6 (ref 1.2–2.2)
Albumin: 4.5 g/dL (ref 3.8–4.8)
Alkaline Phosphatase: 45 IU/L (ref 39–117)
BUN/Creatinine Ratio: 16 (ref 9–23)
BUN: 12 mg/dL (ref 6–20)
Bilirubin Total: 0.2 mg/dL (ref 0.0–1.2)
CO2: 20 mmol/L (ref 20–29)
Calcium: 9.2 mg/dL (ref 8.7–10.2)
Chloride: 102 mmol/L (ref 96–106)
Creatinine, Ser: 0.73 mg/dL (ref 0.57–1.00)
GFR calc Af Amer: 124 mL/min/{1.73_m2} (ref >59)
GFR calc non Af Amer: 108 mL/min/{1.73_m2} (ref >59)
Globulin, Total: 2.8 g/dL (ref 1.5–4.5)
Glucose: 105 mg/dL — ABNORMAL HIGH (ref 65–99)
Potassium: 4.2 mmol/L (ref 3.5–5.2)
Sodium: 137 mmol/L (ref 134–144)
Total Protein: 7.3 g/dL (ref 6.0–8.5)

## 2018-06-26 LAB — LIPID PANEL
Chol/HDL Ratio: 3 ratio (ref 0.0–4.4)
Cholesterol, Total: 143 mg/dL (ref 100–199)
HDL: 48 mg/dL (ref >39)
LDL Calculated: 85 mg/dL (ref 0–99)
Triglycerides: 51 mg/dL (ref 0–149)
VLDL Cholesterol Cal: 10 mg/dL (ref 5–40)

## 2018-06-26 LAB — RPR: RPR Ser Ql: NONREACTIVE

## 2018-06-26 LAB — HEMOGLOBIN A1C
Est. average glucose Bld gHb Est-mCnc: 131 mg/dL
Hgb A1c MFr Bld: 6.2 % — ABNORMAL HIGH (ref 4.8–5.6)

## 2018-06-26 LAB — HIV ANTIBODY (ROUTINE TESTING W REFLEX): HIV Screen 4th Generation wRfx: NONREACTIVE

## 2018-06-26 LAB — MICROALBUMIN, URINE: Microalbumin, Urine: <3 ug/mL

## 2018-06-26 LAB — TSH: TSH: 1.3 u[IU]/mL (ref 0.450–4.500)

## 2018-06-28 LAB — GC/CHLAMYDIA PROBE AMP (~~LOC~~) NOT AT ARMC
Chlamydia: NEGATIVE
Neisseria Gonorrhea: NEGATIVE

## 2018-06-29 DIAGNOSIS — E282 Polycystic ovarian syndrome: Secondary | ICD-10-CM | POA: Diagnosis not present

## 2018-06-29 DIAGNOSIS — N979 Female infertility, unspecified: Secondary | ICD-10-CM | POA: Diagnosis not present

## 2018-08-05 DIAGNOSIS — Z3201 Encounter for pregnancy test, result positive: Secondary | ICD-10-CM | POA: Diagnosis not present

## 2018-08-09 DIAGNOSIS — Z3201 Encounter for pregnancy test, result positive: Secondary | ICD-10-CM | POA: Diagnosis not present

## 2018-08-10 ENCOUNTER — Encounter: Payer: Self-pay | Admitting: Family Medicine

## 2018-08-11 ENCOUNTER — Other Ambulatory Visit: Payer: Self-pay | Admitting: *Deleted

## 2018-08-11 MED ORDER — FREESTYLE LIBRE 14 DAY SENSOR MISC: 1.0000 "application " | Freq: Every day | 4 refills | Status: DC

## 2018-08-12 DIAGNOSIS — N912 Amenorrhea, unspecified: Secondary | ICD-10-CM | POA: Diagnosis not present

## 2018-08-12 DIAGNOSIS — Z368A Encounter for antenatal screening for other genetic defects: Secondary | ICD-10-CM | POA: Diagnosis not present

## 2018-08-25 DIAGNOSIS — Z113 Encounter for screening for infections with a predominantly sexual mode of transmission: Secondary | ICD-10-CM | POA: Diagnosis not present

## 2018-08-25 DIAGNOSIS — Z3A01 Less than 8 weeks gestation of pregnancy: Secondary | ICD-10-CM | POA: Diagnosis not present

## 2018-08-25 DIAGNOSIS — Z3689 Encounter for other specified antenatal screening: Secondary | ICD-10-CM | POA: Diagnosis not present

## 2018-08-25 DIAGNOSIS — O26891 Other specified pregnancy related conditions, first trimester: Secondary | ICD-10-CM | POA: Diagnosis not present

## 2018-08-25 DIAGNOSIS — O09511 Supervision of elderly primigravida, first trimester: Secondary | ICD-10-CM | POA: Diagnosis not present

## 2018-08-25 LAB — OB RESULTS CONSOLE HIV ANTIBODY (ROUTINE TESTING): HIV: NONREACTIVE

## 2018-08-25 LAB — OB RESULTS CONSOLE GC/CHLAMYDIA
Chlamydia: NEGATIVE
Gonorrhea: NEGATIVE

## 2018-08-25 LAB — OB RESULTS CONSOLE HEPATITIS B SURFACE ANTIGEN: Hepatitis B Surface Ag: NEGATIVE

## 2018-08-25 LAB — OB RESULTS CONSOLE ABO/RH: RH Type: POSITIVE

## 2018-08-25 LAB — OB RESULTS CONSOLE RPR: RPR: NONREACTIVE

## 2018-08-25 LAB — OB RESULTS CONSOLE ANTIBODY SCREEN: Antibody Screen: NEGATIVE

## 2018-08-25 LAB — OB RESULTS CONSOLE RUBELLA ANTIBODY, IGM: Rubella: IMMUNE

## 2018-09-24 DIAGNOSIS — R8290 Unspecified abnormal findings in urine: Secondary | ICD-10-CM | POA: Diagnosis not present

## 2018-10-26 DIAGNOSIS — Z23 Encounter for immunization: Secondary | ICD-10-CM | POA: Diagnosis not present

## 2018-10-26 DIAGNOSIS — Z3A16 16 weeks gestation of pregnancy: Secondary | ICD-10-CM | POA: Diagnosis not present

## 2018-11-11 DIAGNOSIS — O09512 Supervision of elderly primigravida, second trimester: Secondary | ICD-10-CM | POA: Diagnosis not present

## 2018-11-11 DIAGNOSIS — Z363 Encounter for antenatal screening for malformations: Secondary | ICD-10-CM | POA: Diagnosis not present

## 2018-11-11 DIAGNOSIS — Z3A18 18 weeks gestation of pregnancy: Secondary | ICD-10-CM | POA: Diagnosis not present

## 2018-11-11 DIAGNOSIS — O24112 Pre-existing diabetes mellitus, type 2, in pregnancy, second trimester: Secondary | ICD-10-CM | POA: Diagnosis not present

## 2018-11-26 DIAGNOSIS — O09512 Supervision of elderly primigravida, second trimester: Secondary | ICD-10-CM | POA: Diagnosis not present

## 2018-11-26 DIAGNOSIS — Z3A21 21 weeks gestation of pregnancy: Secondary | ICD-10-CM | POA: Diagnosis not present

## 2018-11-26 DIAGNOSIS — N390 Urinary tract infection, site not specified: Secondary | ICD-10-CM | POA: Diagnosis not present

## 2018-11-26 DIAGNOSIS — Z362 Encounter for other antenatal screening follow-up: Secondary | ICD-10-CM | POA: Diagnosis not present

## 2018-12-27 ENCOUNTER — Ambulatory Visit: Payer: BC Managed Care – PPO | Admitting: Family Medicine

## 2019-01-18 DIAGNOSIS — Z3689 Encounter for other specified antenatal screening: Secondary | ICD-10-CM | POA: Diagnosis not present

## 2019-01-18 DIAGNOSIS — Z23 Encounter for immunization: Secondary | ICD-10-CM | POA: Diagnosis not present

## 2019-01-18 DIAGNOSIS — N390 Urinary tract infection, site not specified: Secondary | ICD-10-CM | POA: Diagnosis not present

## 2019-01-24 DIAGNOSIS — Z20828 Contact with and (suspected) exposure to other viral communicable diseases: Secondary | ICD-10-CM | POA: Diagnosis not present

## 2019-01-25 DIAGNOSIS — Z3A29 29 weeks gestation of pregnancy: Secondary | ICD-10-CM | POA: Diagnosis not present

## 2019-01-25 DIAGNOSIS — O24119 Pre-existing diabetes mellitus, type 2, in pregnancy, unspecified trimester: Secondary | ICD-10-CM | POA: Diagnosis not present

## 2019-01-28 DIAGNOSIS — Z3A30 30 weeks gestation of pregnancy: Secondary | ICD-10-CM | POA: Diagnosis not present

## 2019-01-28 DIAGNOSIS — O24113 Pre-existing diabetes mellitus, type 2, in pregnancy, third trimester: Secondary | ICD-10-CM | POA: Diagnosis not present

## 2019-02-01 DIAGNOSIS — O24119 Pre-existing diabetes mellitus, type 2, in pregnancy, unspecified trimester: Secondary | ICD-10-CM | POA: Diagnosis not present

## 2019-02-01 DIAGNOSIS — Z3A30 30 weeks gestation of pregnancy: Secondary | ICD-10-CM | POA: Diagnosis not present

## 2019-02-04 DIAGNOSIS — O24313 Unspecified pre-existing diabetes mellitus in pregnancy, third trimester: Secondary | ICD-10-CM | POA: Diagnosis not present

## 2019-02-04 DIAGNOSIS — Z3A30 30 weeks gestation of pregnancy: Secondary | ICD-10-CM | POA: Diagnosis not present

## 2019-02-04 DIAGNOSIS — O24312 Unspecified pre-existing diabetes mellitus in pregnancy, second trimester: Secondary | ICD-10-CM | POA: Diagnosis not present

## 2019-02-07 DIAGNOSIS — O09519 Supervision of elderly primigravida, unspecified trimester: Secondary | ICD-10-CM | POA: Diagnosis not present

## 2019-02-07 DIAGNOSIS — O3663X0 Maternal care for excessive fetal growth, third trimester, not applicable or unspecified: Secondary | ICD-10-CM | POA: Diagnosis not present

## 2019-02-07 DIAGNOSIS — Z3A31 31 weeks gestation of pregnancy: Secondary | ICD-10-CM | POA: Diagnosis not present

## 2019-02-15 DIAGNOSIS — O24113 Pre-existing diabetes mellitus, type 2, in pregnancy, third trimester: Secondary | ICD-10-CM | POA: Diagnosis not present

## 2019-02-15 DIAGNOSIS — O99213 Obesity complicating pregnancy, third trimester: Secondary | ICD-10-CM | POA: Diagnosis not present

## 2019-02-15 DIAGNOSIS — O09513 Supervision of elderly primigravida, third trimester: Secondary | ICD-10-CM | POA: Diagnosis not present

## 2019-02-15 DIAGNOSIS — Z3A32 32 weeks gestation of pregnancy: Secondary | ICD-10-CM | POA: Diagnosis not present

## 2019-02-23 DIAGNOSIS — O24113 Pre-existing diabetes mellitus, type 2, in pregnancy, third trimester: Secondary | ICD-10-CM | POA: Diagnosis not present

## 2019-02-23 DIAGNOSIS — O09513 Supervision of elderly primigravida, third trimester: Secondary | ICD-10-CM | POA: Diagnosis not present

## 2019-02-23 DIAGNOSIS — Z3A33 33 weeks gestation of pregnancy: Secondary | ICD-10-CM | POA: Diagnosis not present

## 2019-03-03 DIAGNOSIS — Z3A34 34 weeks gestation of pregnancy: Secondary | ICD-10-CM | POA: Diagnosis not present

## 2019-03-03 DIAGNOSIS — O24113 Pre-existing diabetes mellitus, type 2, in pregnancy, third trimester: Secondary | ICD-10-CM | POA: Diagnosis not present

## 2019-03-03 DIAGNOSIS — O99213 Obesity complicating pregnancy, third trimester: Secondary | ICD-10-CM | POA: Diagnosis not present

## 2019-03-09 DIAGNOSIS — Z3A35 35 weeks gestation of pregnancy: Secondary | ICD-10-CM | POA: Diagnosis not present

## 2019-03-09 DIAGNOSIS — O24113 Pre-existing diabetes mellitus, type 2, in pregnancy, third trimester: Secondary | ICD-10-CM | POA: Diagnosis not present

## 2019-03-09 DIAGNOSIS — O99213 Obesity complicating pregnancy, third trimester: Secondary | ICD-10-CM | POA: Diagnosis not present

## 2019-03-09 DIAGNOSIS — O09513 Supervision of elderly primigravida, third trimester: Secondary | ICD-10-CM | POA: Diagnosis not present

## 2019-03-09 DIAGNOSIS — Z3685 Encounter for antenatal screening for Streptococcus B: Secondary | ICD-10-CM | POA: Diagnosis not present

## 2019-03-09 LAB — OB RESULTS CONSOLE GBS: GBS: POSITIVE

## 2019-03-15 DIAGNOSIS — Z3A36 36 weeks gestation of pregnancy: Secondary | ICD-10-CM | POA: Diagnosis not present

## 2019-03-15 DIAGNOSIS — O99213 Obesity complicating pregnancy, third trimester: Secondary | ICD-10-CM | POA: Diagnosis not present

## 2019-03-15 DIAGNOSIS — O24113 Pre-existing diabetes mellitus, type 2, in pregnancy, third trimester: Secondary | ICD-10-CM | POA: Diagnosis not present

## 2019-03-15 DIAGNOSIS — O09513 Supervision of elderly primigravida, third trimester: Secondary | ICD-10-CM | POA: Diagnosis not present

## 2019-03-17 ENCOUNTER — Telehealth (HOSPITAL_COMMUNITY): Payer: Self-pay | Admitting: *Deleted

## 2019-03-17 ENCOUNTER — Encounter (HOSPITAL_COMMUNITY): Payer: Self-pay | Admitting: *Deleted

## 2019-03-17 NOTE — Telephone Encounter (Signed)
Preadmission screen  

## 2019-03-21 ENCOUNTER — Telehealth (HOSPITAL_COMMUNITY): Payer: Self-pay | Admitting: *Deleted

## 2019-03-21 ENCOUNTER — Encounter (HOSPITAL_COMMUNITY): Payer: Self-pay | Admitting: *Deleted

## 2019-03-21 NOTE — Telephone Encounter (Signed)
Preadmission screen  

## 2019-03-23 DIAGNOSIS — O99213 Obesity complicating pregnancy, third trimester: Secondary | ICD-10-CM | POA: Diagnosis not present

## 2019-03-23 DIAGNOSIS — O24113 Pre-existing diabetes mellitus, type 2, in pregnancy, third trimester: Secondary | ICD-10-CM | POA: Diagnosis not present

## 2019-03-23 DIAGNOSIS — Z3A37 37 weeks gestation of pregnancy: Secondary | ICD-10-CM | POA: Diagnosis not present

## 2019-03-23 DIAGNOSIS — O09513 Supervision of elderly primigravida, third trimester: Secondary | ICD-10-CM | POA: Diagnosis not present

## 2019-03-26 ENCOUNTER — Other Ambulatory Visit (HOSPITAL_COMMUNITY)
Admission: RE | Admit: 2019-03-26 | Discharge: 2019-03-26 | Disposition: A | Discharge status: Home / Self Care | Payer: BC Managed Care – PPO | Source: Ambulatory Visit | Attending: Obstetrics and Gynecology | Admitting: Obstetrics and Gynecology

## 2019-03-26 DIAGNOSIS — Z20822 Contact with and (suspected) exposure to covid-19: Secondary | ICD-10-CM | POA: Insufficient documentation

## 2019-03-26 DIAGNOSIS — Z01812 Encounter for preprocedural laboratory examination: Secondary | ICD-10-CM | POA: Insufficient documentation

## 2019-03-26 LAB — SARS CORONAVIRUS 2 (TAT 6-24 HRS): SARS Coronavirus 2: NEGATIVE

## 2019-03-29 ENCOUNTER — Other Ambulatory Visit: Payer: Self-pay

## 2019-03-29 ENCOUNTER — Inpatient Hospital Stay (HOSPITAL_COMMUNITY): Payer: BC Managed Care – PPO

## 2019-03-29 ENCOUNTER — Inpatient Hospital Stay (HOSPITAL_COMMUNITY): Payer: BC Managed Care – PPO | Admitting: Anesthesiology

## 2019-03-29 ENCOUNTER — Inpatient Hospital Stay (HOSPITAL_COMMUNITY)
Admission: AD | Admit: 2019-03-29 | Discharge: 2019-04-01 | DRG: 788 | Disposition: A | Discharge status: Home / Self Care | Payer: BC Managed Care – PPO | Attending: Obstetrics and Gynecology | Admitting: Obstetrics and Gynecology

## 2019-03-29 ENCOUNTER — Encounter (HOSPITAL_COMMUNITY): Payer: Self-pay | Admitting: Obstetrics and Gynecology

## 2019-03-29 DIAGNOSIS — Z794 Long term (current) use of insulin: Secondary | ICD-10-CM | POA: Diagnosis not present

## 2019-03-29 DIAGNOSIS — Z98891 History of uterine scar from previous surgery: Secondary | ICD-10-CM | POA: Diagnosis not present

## 2019-03-29 DIAGNOSIS — E119 Type 2 diabetes mellitus without complications: Secondary | ICD-10-CM | POA: Diagnosis not present

## 2019-03-29 DIAGNOSIS — Z3A38 38 weeks gestation of pregnancy: Secondary | ICD-10-CM

## 2019-03-29 DIAGNOSIS — O2492 Unspecified diabetes mellitus in childbirth: Secondary | ICD-10-CM | POA: Diagnosis not present

## 2019-03-29 DIAGNOSIS — O2412 Pre-existing diabetes mellitus, type 2, in childbirth: Principal | ICD-10-CM | POA: Diagnosis present

## 2019-03-29 DIAGNOSIS — O99824 Streptococcus B carrier state complicating childbirth: Secondary | ICD-10-CM | POA: Diagnosis not present

## 2019-03-29 DIAGNOSIS — O99214 Obesity complicating childbirth: Secondary | ICD-10-CM | POA: Diagnosis present

## 2019-03-29 DIAGNOSIS — O3663X0 Maternal care for excessive fetal growth, third trimester, not applicable or unspecified: Secondary | ICD-10-CM | POA: Diagnosis not present

## 2019-03-29 DIAGNOSIS — Z9641 Presence of insulin pump (external) (internal): Secondary | ICD-10-CM | POA: Diagnosis not present

## 2019-03-29 LAB — TYPE AND SCREEN
ABO/RH(D): O POS
Antibody Screen: NEGATIVE

## 2019-03-29 LAB — CBC
HCT: 34.4 % — ABNORMAL LOW (ref 36.0–46.0)
Hemoglobin: 11 g/dL — ABNORMAL LOW (ref 12.0–15.0)
MCH: 27.2 pg (ref 26.0–34.0)
MCHC: 32 g/dL (ref 30.0–36.0)
MCV: 84.9 fL (ref 80.0–100.0)
Platelets: 279 10*3/uL (ref 150–400)
RBC: 4.05 MIL/uL (ref 3.87–5.11)
RDW: 14.8 % (ref 11.5–15.5)
WBC: 9.2 10*3/uL (ref 4.0–10.5)
nRBC: 0 % (ref 0.0–0.2)

## 2019-03-29 LAB — ABO/RH: ABO/RH(D): O POS

## 2019-03-29 LAB — RPR: RPR Ser Ql: NONREACTIVE

## 2019-03-29 MED ORDER — PHENYLEPHRINE 40 MCG/ML (10ML) SYRINGE FOR IV PUSH (FOR BLOOD PRESSURE SUPPORT): 80.0000 ug | PREFILLED_SYRINGE | INTRAVENOUS | Status: DC | PRN

## 2019-03-29 MED ORDER — LACTATED RINGERS IV SOLN: INTRAVENOUS | Status: DC

## 2019-03-29 MED ORDER — MISOPROSTOL 50MCG HALF TABLET: 50.0000 ug | ORAL_TABLET | Freq: Once | ORAL | Status: DC

## 2019-03-29 MED ORDER — PENICILLIN G POT IN DEXTROSE 60000 UNIT/ML IV SOLN
3.0000 10*6.[IU] | INTRAVENOUS | Status: DC
  Administered 2019-03-29 – 2019-03-30 (×5): 3 10*6.[IU] via INTRAVENOUS
  Filled 2019-03-29 (×5): qty 50

## 2019-03-29 MED ORDER — LIDOCAINE HCL (PF) 1 % IJ SOLN: 30.0000 mL | INTRAMUSCULAR | Status: DC | PRN

## 2019-03-29 MED ORDER — FENTANYL-BUPIVACAINE-NACL 0.5-0.125-0.9 MG/250ML-% EP SOLN
12.0000 mL/h | EPIDURAL | Status: DC | PRN
  Filled 2019-03-29: qty 250

## 2019-03-29 MED ORDER — ACETAMINOPHEN 325 MG PO TABS
650.0000 mg | ORAL_TABLET | ORAL | Status: DC | PRN
  Administered 2019-03-29: 650 mg via ORAL
  Filled 2019-03-29: qty 2

## 2019-03-29 MED ORDER — EPHEDRINE 5 MG/ML INJ: 10.0000 mg | INTRAVENOUS | Status: DC | PRN

## 2019-03-29 MED ORDER — TERBUTALINE SULFATE 1 MG/ML IJ SOLN: 0.2500 mg | Freq: Once | INTRAMUSCULAR | Status: DC | PRN

## 2019-03-29 MED ORDER — SOD CITRATE-CITRIC ACID 500-334 MG/5ML PO SOLN
30.0000 mL | ORAL | Status: DC | PRN
  Administered 2019-03-30: 30 mL via ORAL
  Filled 2019-03-29: qty 30

## 2019-03-29 MED ORDER — LACTATED RINGERS IV SOLN: 500.0000 mL | INTRAVENOUS | Status: DC | PRN

## 2019-03-29 MED ORDER — OXYCODONE-ACETAMINOPHEN 5-325 MG PO TABS: 1.0000 | ORAL_TABLET | ORAL | Status: DC | PRN

## 2019-03-29 MED ORDER — LACTATED RINGERS IV SOLN
500.0000 mL | Freq: Once | INTRAVENOUS | Status: AC
  Administered 2019-03-29: 500 mL via INTRAVENOUS

## 2019-03-29 MED ORDER — LACTATED RINGERS IV SOLN: 500.0000 mL | Freq: Once | INTRAVENOUS | Status: DC

## 2019-03-29 MED ORDER — OXYTOCIN BOLUS FROM INFUSION: 500.0000 mL | Freq: Once | INTRAVENOUS | Status: DC

## 2019-03-29 MED ORDER — BUTORPHANOL TARTRATE 1 MG/ML IJ SOLN
1.0000 mg | INTRAMUSCULAR | Status: DC | PRN
  Administered 2019-03-29: 1 mg via INTRAVENOUS
  Filled 2019-03-29: qty 1

## 2019-03-29 MED ORDER — SODIUM CHLORIDE 0.9 % IV SOLN
5.0000 10*6.[IU] | Freq: Once | INTRAVENOUS | Status: AC
  Administered 2019-03-29: 5 10*6.[IU] via INTRAVENOUS
  Filled 2019-03-29: qty 5

## 2019-03-29 MED ORDER — MISOPROSTOL 50MCG HALF TABLET
50.0000 ug | ORAL_TABLET | Freq: Once | ORAL | Status: AC
  Administered 2019-03-29: 50 ug via BUCCAL
  Filled 2019-03-29: qty 1

## 2019-03-29 MED ORDER — SODIUM CHLORIDE (PF) 0.9 % IJ SOLN
INTRAMUSCULAR | Status: DC | PRN
  Administered 2019-03-29: 12 mL/h via EPIDURAL

## 2019-03-29 MED ORDER — DIPHENHYDRAMINE HCL 50 MG/ML IJ SOLN
12.5000 mg | INTRAMUSCULAR | Status: DC | PRN
  Administered 2019-03-30: 01:00:00 12.5 mg via INTRAVENOUS
  Filled 2019-03-29: qty 1

## 2019-03-29 MED ORDER — LIDOCAINE HCL (PF) 1 % IJ SOLN
INTRAMUSCULAR | Status: DC | PRN
  Administered 2019-03-29: 5 mL via EPIDURAL

## 2019-03-29 MED ORDER — OXYTOCIN 40 UNITS IN NORMAL SALINE INFUSION - SIMPLE MED: 2.5000 [IU]/h | INTRAVENOUS | Status: DC

## 2019-03-29 MED ORDER — FLEET ENEMA 7-19 GM/118ML RE ENEM: 1.0000 | ENEMA | RECTAL | Status: DC | PRN

## 2019-03-29 MED ORDER — ONDANSETRON HCL 4 MG/2ML IJ SOLN
4.0000 mg | Freq: Four times a day (QID) | INTRAMUSCULAR | Status: DC | PRN
  Administered 2019-03-29 – 2019-03-30 (×5): 4 mg via INTRAVENOUS
  Filled 2019-03-29 (×4): qty 2

## 2019-03-29 MED ORDER — OXYCODONE-ACETAMINOPHEN 5-325 MG PO TABS: 2.0000 | ORAL_TABLET | ORAL | Status: DC | PRN

## 2019-03-29 MED ORDER — OXYTOCIN 40 UNITS IN NORMAL SALINE INFUSION - SIMPLE MED
1.0000 m[IU]/min | INTRAVENOUS | Status: DC
  Administered 2019-03-29: 17:00:00 2 m[IU]/min via INTRAVENOUS
  Filled 2019-03-29: qty 1000

## 2019-03-29 NOTE — Progress Notes (Signed)
Pt checked blood sugar on home machine, CBG is 84.  Will continue to monitor.  Delfino Lovett Luddie Boghosian RN

## 2019-03-29 NOTE — Progress Notes (Signed)
Patient ID: Pamela Duarte, female   DOB: 19-Mar-1985, 34 y.o.   MRN: [id number] Pt still doing well. Appreciating some contractions Foley bulb came out about an hour ago. +Fms VSS EFM - 145, cat 1 TOCO - ctxs q 1-78mins SVE - 5/80/-2  A/P: Prime at term with type 2 DM progressing in labor         - s/p cytotec, foley and now on 2u pitocin         - titrate pitocin per protocol         - on second dose of PCN for GBS         - pain meds prn         - anticipate svd

## 2019-03-29 NOTE — H&P (Signed)
Pamela Duarte is a 34 y.o. prime  female presenting for scheduled induction of labor due to type 2 DM - moderate control. Pt is dated per 7 week [place]. Her diabetes was managed with metformin and an insulin pump. She had a benign prenatal course. She is GBS positive - no allergies. Genetic testing negative. . OB History    Gravida  1   Para      Term      Preterm      AB      Living        SAB      TAB      Ectopic      Multiple      Live Births             Past Medical History:  Diagnosis Date  . 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 PCOS (polycystic ovarian syndrome)    Past Surgical History:  Procedure Laterality Date  . ABDOMINOPLASTY  2015  . COSMETIC SURGERY     Family History: family history includes Cancer in her paternal grandfather; Diabetes in her maternal grandmother, paternal grandfather, and paternal grandmother; Hypertension in her maternal grandmother, mother, paternal grandfather, and paternal grandmother. Social History:  reports that she has never smoked. She has never used smokeless tobacco. She reports that she does not drink alcohol or use drugs.     Maternal Diabetes: Yes:  Diabetes Type:  Pre-pregnancy, Insulin/Medication controlled Genetic Screening: Normal Maternal Ultrasounds/Referrals: Normal Fetal Ultrasounds or other Referrals:  Fetal echo Maternal Substance Abuse:  No Significant Maternal Medications:  Meds include: Other: see HPI Significant Maternal Lab Results:  Group B Strep positive Other Comments:  None  Review of Systems  Constitutional: Negative for activity change, appetite change and fatigue.  Respiratory: Negative for shortness of breath.   Cardiovascular: Positive for leg swelling. Negative for chest pain and palpitations.  Gastrointestinal: Negative for abdominal pain.  Musculoskeletal: Negative for myalgias.  Neurological: Negative for dizziness, light-headedness and headaches.   Psychiatric/Behavioral: The patient is nervous/anxious.    Maternal Medical History:  Reason for admission: Scheduled iol for GDMB  Contractions: Perceived severity is mild.    Fetal activity: Perceived fetal activity is normal.   Last perceived fetal movement was within the past hour.    Prenatal Complications - Diabetes: type 2. Diabetes is managed by insulin pump.      Dilation: 1.5 Exam by:: Debany Vantol Height 5\' 7"  (1.702 m), weight 125 kg, last menstrual period 06/25/2018. Maternal Exam:  Uterine Assessment: Contraction strength is mild.  Contraction frequency is rare.   Abdomen: Patient reports generalized tenderness.  Estimated fetal weight is AGA.   Fetal presentation: vertex  Introitus: Normal vulva. Vulva is negative for condylomata and lesion.  Normal vagina.  Vagina is negative for condylomata and discharge.  Pelvis: adequate for delivery.   Cervix: Cervix evaluated by digital exam.     Fetal Exam Fetal Monitor Review: Baseline rate: 150.  Variability: moderate (6-25 bpm).   Pattern: accelerations present and no decelerations.    Fetal State Assessment: Category I - tracings are normal.     Physical Exam  Constitutional: She is oriented to person, place, and time. She appears well-developed and well-nourished.  Cardiovascular: Normal rate.  Respiratory: Effort normal.  GI: Soft. There is generalized abdominal tenderness.  Genitourinary:    Vulva, vagina and uterus normal.     No vulval condylomata or lesion noted.  No vaginal discharge.   Musculoskeletal:        General: Normal range of motion.     Cervical back: Normal range of motion.  Neurological: She is alert and oriented to person, place, and time.  Psychiatric: She has a normal mood and affect. Her behavior is normal. Judgment and thought content normal.    Prenatal labs: ABO, Rh: --/--/O POS (03/16 4696) Antibody: NEG Performed at [facility] [address], [place], [place]  [address]  [phone]) Rubella: Immune (08/12 0000) RPR: Nonreactive (08/12 0000)  HBsAg: Negative (08/12 0000)  HIV: Non-reactive (08/12 0000)  GBS: Positive/-- (02/24 0000)   Assessment/Plan: 34yo G1P0 at 72 5/7wks with type 2 DM - moderate control Admit Foley catheter placed with 60u/40v saline Cytotec 21mcg oral now Pain control prn GBS pos - treat with PCN in active labor Sars covid neg  Anticipate svd Venetia Night Osinachi Navarrette 03/29/2019, 10:11 AM

## 2019-03-29 NOTE — Anesthesia Preprocedure Evaluation (Signed)
Anesthesia Evaluation  Patient identified by MRN, date of birth, ID band Patient awake    Reviewed: Allergy & Precautions, H&P , NPO status , Patient's Chart, lab work & pertinent test results  Airway Mallampati: II   Neck ROM: full    Dental   Pulmonary neg pulmonary ROS,    breath sounds clear to auscultation       Cardiovascular negative cardio ROS   Rhythm:regular Rate:Normal     Neuro/Psych    GI/Hepatic   Endo/Other  diabetes, Type obesity  Renal/GU      Musculoskeletal   Abdominal   Peds  Hematology   Anesthesia Other Findings   Reproductive/Obstetrics (+) Pregnancy                             Anesthesia Physical Anesthesia Plan  ASA: II  Anesthesia Plan: Epidural   Post-op Pain Management:    Induction: Intravenous  PONV Risk Score and Plan: 2 and Treatment may vary due to age or medical condition  Airway Management Planned: Natural Airway  Additional Equipment:   Intra-op Plan:   Post-operative Plan:   Informed Consent: I have reviewed the patients History and Physical, chart, labs and discussed the procedure including the risks, benefits and alternatives for the proposed anesthesia with the patient or authorized representative who has indicated his/her understanding and acceptance.       Plan Discussed with: Anesthesiologist  Anesthesia Plan Comments:         Anesthesia Quick Evaluation

## 2019-03-29 NOTE — Progress Notes (Signed)
Patient ID: Pamela Duarte, female   DOB: [birth year], 34 y.o.   MRN: [id number] Pt reports some cramping. Foley still in place VSS GEN - NAD EFM - 150, no decels, cat 1 TOCO - irreg rare contractions SVE - bulb still in place  A/P: Prime in latent labor s/p foley and cytotec x 1         - Second dose of cytotec oral now         - Recheck prn         - PCN for GBS

## 2019-03-29 NOTE — Anesthesia Procedure Notes (Signed)
Epidural Patient location during procedure: OB Start time: 03/29/2019 8:15 PM End time: 03/29/2019 8:27 PM  Staffing Anesthesiologist: Achille Rich, MD Performed: anesthesiologist   Preanesthetic Checklist Completed: patient identified, IV checked, site marked, risks and benefits discussed, monitors and equipment checked, pre-op evaluation and timeout performed  Epidural Patient position: sitting Prep: DuraPrep Patient monitoring: heart rate, cardiac monitor, continuous pulse ox and blood pressure Approach: midline Location: L2-L3 Injection technique: LOR saline  Needle:  Needle type: Tuohy  Needle gauge: 17 G Needle length: 9 cm Needle insertion depth: 9 cm Catheter type: closed end flexible Catheter size: 19 Gauge Catheter at skin depth: 15 cm Test dose: negative and Other  Assessment Events: blood not aspirated, injection not painful, no injection resistance and negative IV test  Additional Notes Informed consent obtained prior to proceeding including risk of failure, 1% risk of PDPH, risk of minor discomfort and bruising.  Discussed rare but serious complications including epidural abscess, permanent nerve injury, epidural hematoma.  Discussed alternatives to epidural analgesia and patient desires to proceed.  Timeout performed pre-procedure verifying patient name, procedure, and platelet count.  Patient tolerated procedure well. Reason for block:procedure for pain

## 2019-03-29 NOTE — Progress Notes (Signed)
Pt checked blood sugar on home machine, CBG is 118.  Will continue to monitor.    Johnathan Hausen RN

## 2019-03-30 ENCOUNTER — Encounter (HOSPITAL_COMMUNITY)
Admission: AD | Disposition: A | Discharge status: Home / Self Care | Payer: Self-pay | Source: Home / Self Care | Attending: Obstetrics and Gynecology

## 2019-03-30 ENCOUNTER — Encounter (HOSPITAL_COMMUNITY): Payer: Self-pay | Admitting: Obstetrics and Gynecology

## 2019-03-30 DIAGNOSIS — Z98891 History of uterine scar from previous surgery: Secondary | ICD-10-CM | POA: Diagnosis not present

## 2019-03-30 DIAGNOSIS — O2412 Pre-existing diabetes mellitus, type 2, in childbirth: Secondary | ICD-10-CM | POA: Diagnosis not present

## 2019-03-30 DIAGNOSIS — O2492 Unspecified diabetes mellitus in childbirth: Secondary | ICD-10-CM | POA: Diagnosis not present

## 2019-03-30 DIAGNOSIS — Z3A38 38 weeks gestation of pregnancy: Secondary | ICD-10-CM | POA: Diagnosis not present

## 2019-03-30 LAB — BASIC METABOLIC PANEL
Anion gap: 12 (ref 5–15)
BUN: 7 mg/dL (ref 6–20)
CO2: 20 mmol/L — ABNORMAL LOW (ref 22–32)
Calcium: 8.6 mg/dL — ABNORMAL LOW (ref 8.9–10.3)
Chloride: 106 mmol/L (ref 98–111)
Creatinine, Ser: 0.63 mg/dL (ref 0.44–1.00)
GFR calc Af Amer: >60 mL/min (ref >60)
GFR calc non Af Amer: >60 mL/min (ref >60)
Glucose, Bld: 102 mg/dL — ABNORMAL HIGH (ref 70–99)
Potassium: 4.1 mmol/L (ref 3.5–5.1)
Sodium: 138 mmol/L (ref 135–145)

## 2019-03-30 LAB — HEMOGLOBIN A1C
Hgb A1c MFr Bld: 6.8 % — ABNORMAL HIGH (ref 4.8–5.6)
Mean Plasma Glucose: 148.46 mg/dL

## 2019-03-30 LAB — PROTEIN / CREATININE RATIO, URINE
Creatinine, Urine: 62.29 mg/dL
Protein Creatinine Ratio: 0.19 mg/mg{Cre} — ABNORMAL HIGH (ref 0.00–0.15)
Total Protein, Urine: 12 mg/dL

## 2019-03-30 SURGERY — Surgical Case
Anesthesia: Epidural | Site: Abdomen | Wound class: Clean Contaminated

## 2019-03-30 MED ORDER — KETOROLAC TROMETHAMINE 30 MG/ML IJ SOLN
30.0000 mg | Freq: Four times a day (QID) | INTRAMUSCULAR | Status: AC | PRN
  Administered 2019-03-30: 30 mg via INTRAMUSCULAR

## 2019-03-30 MED ORDER — PHENYLEPHRINE HCL (PRESSORS) 10 MG/ML IV SOLN
INTRAVENOUS | Status: DC | PRN
  Administered 2019-03-30 (×3): 80 ug via INTRAVENOUS

## 2019-03-30 MED ORDER — ACETAMINOPHEN 500 MG PO TABS
1000.0000 mg | ORAL_TABLET | Freq: Four times a day (QID) | ORAL | Status: AC
  Administered 2019-03-30 – 2019-03-31 (×3): 1000 mg via ORAL
  Filled 2019-03-30 (×3): qty 2

## 2019-03-30 MED ORDER — NALBUPHINE HCL 10 MG/ML IJ SOLN
5.0000 mg | Freq: Once | INTRAMUSCULAR | Status: DC | PRN
  Filled 2019-03-30: qty 1

## 2019-03-30 MED ORDER — ENOXAPARIN SODIUM 80 MG/0.8ML ~~LOC~~ SOLN
0.5000 mg/kg | SUBCUTANEOUS | Status: DC
  Administered 2019-03-31 – 2019-04-01 (×2): 65 mg via SUBCUTANEOUS
  Filled 2019-03-30 (×2): qty 0.8

## 2019-03-30 MED ORDER — LIDOCAINE-EPINEPHRINE (PF) 2 %-1:200000 IJ SOLN
INTRAMUSCULAR | Status: AC
  Filled 2019-03-30: qty 10

## 2019-03-30 MED ORDER — SODIUM CHLORIDE 0.9 % IV SOLN: INTRAVENOUS | Status: DC

## 2019-03-30 MED ORDER — LABETALOL HCL 5 MG/ML IV SOLN: 40.0000 mg | INTRAVENOUS | Status: DC | PRN

## 2019-03-30 MED ORDER — SODIUM CHLORIDE 0.9 % IV SOLN
3.0000 g | Freq: Once | INTRAVENOUS | Status: AC
  Administered 2019-03-30: 3 g via INTRAVENOUS
  Filled 2019-03-30: qty 3

## 2019-03-30 MED ORDER — WITCH HAZEL-GLYCERIN EX PADS: 1.0000 "application " | MEDICATED_PAD | CUTANEOUS | Status: DC | PRN

## 2019-03-30 MED ORDER — SODIUM CHLORIDE 0.9 % IR SOLN
Status: DC | PRN
  Administered 2019-03-30: 1000 mL

## 2019-03-30 MED ORDER — LABETALOL HCL 5 MG/ML IV SOLN: 80.0000 mg | INTRAVENOUS | Status: DC | PRN

## 2019-03-30 MED ORDER — INSULIN REGULAR(HUMAN) IN NACL 100-0.9 UT/100ML-% IV SOLN
INTRAVENOUS | Status: DC
  Administered 2019-03-30: 1.1 [IU]/h via INTRAVENOUS
  Filled 2019-03-30 (×2): qty 100

## 2019-03-30 MED ORDER — ONDANSETRON HCL 4 MG/2ML IJ SOLN
INTRAMUSCULAR | Status: AC
  Filled 2019-03-30: qty 2

## 2019-03-30 MED ORDER — MENTHOL 3 MG MT LOZG: 1.0000 | LOZENGE | OROMUCOSAL | Status: DC | PRN

## 2019-03-30 MED ORDER — DIPHENHYDRAMINE HCL 25 MG PO CAPS
25.0000 mg | ORAL_CAPSULE | ORAL | Status: DC | PRN
  Administered 2019-03-31 (×2): 25 mg via ORAL
  Filled 2019-03-30 (×3): qty 1

## 2019-03-30 MED ORDER — METFORMIN HCL 500 MG PO TABS: 1000.0000 mg | ORAL_TABLET | Freq: Two times a day (BID) | ORAL | Status: DC

## 2019-03-30 MED ORDER — FENTANYL CITRATE (PF) 100 MCG/2ML IJ SOLN
INTRAMUSCULAR | Status: DC | PRN
  Administered 2019-03-30: 100 ug via EPIDURAL

## 2019-03-30 MED ORDER — FENTANYL CITRATE (PF) 100 MCG/2ML IJ SOLN
INTRAMUSCULAR | Status: AC
  Filled 2019-03-30: qty 2

## 2019-03-30 MED ORDER — OXYTOCIN 40 UNITS IN NORMAL SALINE INFUSION - SIMPLE MED
INTRAVENOUS | Status: DC | PRN
  Administered 2019-03-30 (×2): 200 mL via INTRAVENOUS

## 2019-03-30 MED ORDER — FENTANYL CITRATE (PF) 100 MCG/2ML IJ SOLN: 25.0000 ug | INTRAMUSCULAR | Status: DC | PRN

## 2019-03-30 MED ORDER — NALBUPHINE HCL 10 MG/ML IJ SOLN
5.0000 mg | INTRAMUSCULAR | Status: DC | PRN
  Administered 2019-03-30 – 2019-03-31 (×4): 5 mg via INTRAVENOUS
  Filled 2019-03-30 (×4): qty 1

## 2019-03-30 MED ORDER — OXYTOCIN 40 UNITS IN NORMAL SALINE INFUSION - SIMPLE MED: 2.5000 [IU]/h | INTRAVENOUS | Status: AC

## 2019-03-30 MED ORDER — KETOROLAC TROMETHAMINE 30 MG/ML IJ SOLN
30.0000 mg | Freq: Four times a day (QID) | INTRAMUSCULAR | Status: AC | PRN
  Administered 2019-03-30: 30 mg via INTRAVENOUS
  Filled 2019-03-30: qty 1

## 2019-03-30 MED ORDER — HYDRALAZINE HCL 20 MG/ML IJ SOLN: 10.0000 mg | INTRAMUSCULAR | Status: DC | PRN

## 2019-03-30 MED ORDER — SENNOSIDES-DOCUSATE SODIUM 8.6-50 MG PO TABS
2.0000 | ORAL_TABLET | ORAL | Status: DC
  Administered 2019-03-30 – 2019-03-31 (×2): 2 via ORAL
  Filled 2019-03-30 (×2): qty 2

## 2019-03-30 MED ORDER — NALOXONE HCL 4 MG/10ML IJ SOLN
1.0000 ug/kg/h | INTRAVENOUS | Status: DC | PRN
  Filled 2019-03-30: qty 5

## 2019-03-30 MED ORDER — NALOXONE HCL 0.4 MG/ML IJ SOLN: 0.4000 mg | INTRAMUSCULAR | Status: DC | PRN

## 2019-03-30 MED ORDER — IBUPROFEN 800 MG PO TABS
800.0000 mg | ORAL_TABLET | Freq: Three times a day (TID) | ORAL | Status: DC
  Administered 2019-03-31 – 2019-04-01 (×5): 800 mg via ORAL
  Filled 2019-03-30 (×5): qty 1

## 2019-03-30 MED ORDER — SIMETHICONE 80 MG PO CHEW
80.0000 mg | CHEWABLE_TABLET | Freq: Three times a day (TID) | ORAL | Status: DC
  Administered 2019-03-30 – 2019-04-01 (×6): 80 mg via ORAL
  Filled 2019-03-30 (×6): qty 1

## 2019-03-30 MED ORDER — DIPHENHYDRAMINE HCL 25 MG PO CAPS
25.0000 mg | ORAL_CAPSULE | Freq: Four times a day (QID) | ORAL | Status: DC | PRN
  Administered 2019-03-31: 25 mg via ORAL

## 2019-03-30 MED ORDER — TETANUS-DIPHTH-ACELL PERTUSSIS 5-2.5-18.5 LF-MCG/0.5 IM SUSP: 0.5000 mL | Freq: Once | INTRAMUSCULAR | Status: DC

## 2019-03-30 MED ORDER — SCOPOLAMINE 1 MG/3DAYS TD PT72
MEDICATED_PATCH | TRANSDERMAL | Status: AC
  Filled 2019-03-30: qty 1

## 2019-03-30 MED ORDER — DIBUCAINE (PERIANAL) 1 % EX OINT: 1.0000 "application " | TOPICAL_OINTMENT | CUTANEOUS | Status: DC | PRN

## 2019-03-30 MED ORDER — NALBUPHINE HCL 10 MG/ML IJ SOLN: 5.0000 mg | Freq: Once | INTRAMUSCULAR | Status: DC | PRN

## 2019-03-30 MED ORDER — SIMETHICONE 80 MG PO CHEW
80.0000 mg | CHEWABLE_TABLET | ORAL | Status: DC
  Administered 2019-03-31: 80 mg via ORAL
  Filled 2019-03-30 (×2): qty 1

## 2019-03-30 MED ORDER — PHENYLEPHRINE HCL-NACL 20-0.9 MG/250ML-% IV SOLN
INTRAVENOUS | Status: DC | PRN
  Administered 2019-03-30: 50 ug/min via INTRAVENOUS

## 2019-03-30 MED ORDER — SCOPOLAMINE 1 MG/3DAYS TD PT72
1.0000 | MEDICATED_PATCH | Freq: Once | TRANSDERMAL | Status: AC
  Administered 2019-03-30: 1 via TRANSDERMAL

## 2019-03-30 MED ORDER — METFORMIN HCL 500 MG PO TABS
1000.0000 mg | ORAL_TABLET | Freq: Two times a day (BID) | ORAL | Status: DC
  Administered 2019-03-30 – 2019-04-01 (×4): 1000 mg via ORAL
  Filled 2019-03-30 (×5): qty 2

## 2019-03-30 MED ORDER — KETOROLAC TROMETHAMINE 30 MG/ML IJ SOLN: 30.0000 mg | Freq: Once | INTRAMUSCULAR | Status: DC | PRN

## 2019-03-30 MED ORDER — MORPHINE SULFATE (PF) 0.5 MG/ML IJ SOLN
INTRAMUSCULAR | Status: AC
  Filled 2019-03-30: qty 10

## 2019-03-30 MED ORDER — ONDANSETRON HCL 4 MG/2ML IJ SOLN: 4.0000 mg | Freq: Three times a day (TID) | INTRAMUSCULAR | Status: DC | PRN

## 2019-03-30 MED ORDER — SODIUM CHLORIDE 0.9% FLUSH: 3.0000 mL | INTRAVENOUS | Status: DC | PRN

## 2019-03-30 MED ORDER — DEXTROSE-NACL 5-0.45 % IV SOLN: INTRAVENOUS | Status: DC

## 2019-03-30 MED ORDER — DEXTROSE 50 % IV SOLN: 0.0000 mL | INTRAVENOUS | Status: DC | PRN

## 2019-03-30 MED ORDER — DIPHENHYDRAMINE HCL 50 MG/ML IJ SOLN
12.5000 mg | INTRAMUSCULAR | Status: DC | PRN
  Administered 2019-03-30 – 2019-03-31 (×5): 12.5 mg via INTRAVENOUS
  Filled 2019-03-30 (×5): qty 1

## 2019-03-30 MED ORDER — ACETAMINOPHEN 10 MG/ML IV SOLN: 1000.0000 mg | Freq: Once | INTRAVENOUS | Status: DC | PRN

## 2019-03-30 MED ORDER — STERILE WATER FOR IRRIGATION IR SOLN
Status: DC | PRN
  Administered 2019-03-30: 1000 mL

## 2019-03-30 MED ORDER — LACTATED RINGERS IV SOLN: INTRAVENOUS | Status: DC

## 2019-03-30 MED ORDER — NALBUPHINE HCL 10 MG/ML IJ SOLN
5.0000 mg | INTRAMUSCULAR | Status: DC | PRN
  Administered 2019-03-31: 12:00:00 5 mg via SUBCUTANEOUS

## 2019-03-30 MED ORDER — PRENATAL MULTIVITAMIN CH
1.0000 | ORAL_TABLET | Freq: Every day | ORAL | Status: DC
  Administered 2019-03-31 – 2019-04-01 (×2): 1 via ORAL
  Filled 2019-03-30 (×2): qty 1

## 2019-03-30 MED ORDER — COCONUT OIL OIL: 1.0000 "application " | TOPICAL_OIL | Status: DC | PRN

## 2019-03-30 MED ORDER — KETOROLAC TROMETHAMINE 30 MG/ML IJ SOLN
INTRAMUSCULAR | Status: AC
  Filled 2019-03-30: qty 1

## 2019-03-30 MED ORDER — OXYCODONE HCL 5 MG PO TABS
5.0000 mg | ORAL_TABLET | ORAL | Status: DC | PRN
  Administered 2019-03-30: 10 mg via ORAL
  Administered 2019-03-30: 5 mg via ORAL
  Administered 2019-03-31 (×2): 10 mg via ORAL
  Administered 2019-03-31: 5 mg via ORAL
  Administered 2019-03-31 – 2019-04-01 (×2): 10 mg via ORAL
  Administered 2019-04-01: 5 mg via ORAL
  Filled 2019-03-30: qty 2
  Filled 2019-03-30: qty 1
  Filled 2019-03-30 (×3): qty 2
  Filled 2019-03-30 (×2): qty 1
  Filled 2019-03-30: qty 2

## 2019-03-30 MED ORDER — MORPHINE SULFATE (PF) 0.5 MG/ML IJ SOLN
INTRAMUSCULAR | Status: DC | PRN
  Administered 2019-03-30: 3 mg via EPIDURAL

## 2019-03-30 MED ORDER — ACETAMINOPHEN 325 MG PO TABS
650.0000 mg | ORAL_TABLET | ORAL | Status: DC | PRN
  Administered 2019-04-01: 650 mg via ORAL
  Filled 2019-03-30: qty 2

## 2019-03-30 MED ORDER — LIDOCAINE-EPINEPHRINE (PF) 2 %-1:200000 IJ SOLN
INTRAMUSCULAR | Status: DC | PRN
  Administered 2019-03-30 (×4): 5 mL via EPIDURAL

## 2019-03-30 MED ORDER — LABETALOL HCL 5 MG/ML IV SOLN: 20.0000 mg | INTRAVENOUS | Status: DC | PRN

## 2019-03-30 MED ORDER — ZOLPIDEM TARTRATE 5 MG PO TABS: 5.0000 mg | ORAL_TABLET | Freq: Every evening | ORAL | Status: DC | PRN

## 2019-03-30 MED ORDER — SIMETHICONE 80 MG PO CHEW
80.0000 mg | CHEWABLE_TABLET | ORAL | Status: DC | PRN
  Administered 2019-03-30: 80 mg via ORAL

## 2019-03-30 MED ORDER — LACTATED RINGERS AMNIOINFUSION: INTRAVENOUS | Status: DC

## 2019-03-30 SURGICAL SUPPLY — 28 items
BENZOIN TINCTURE PRP APPL 2/3 (GAUZE/BANDAGES/DRESSINGS) ×1 IMPLANT
CHLORAPREP W/TINT 26ML (MISCELLANEOUS) ×2 IMPLANT
CLAMP CORD UMBIL (MISCELLANEOUS) ×1 IMPLANT
CLOTH BEACON ORANGE TIMEOUT ST (SAFETY) ×2 IMPLANT
DRSG OPSITE POSTOP 4X10 (GAUZE/BANDAGES/DRESSINGS) ×2 IMPLANT
ELECT REM PT RETURN 9FT ADLT (ELECTROSURGICAL) ×2
ELECTRODE REM PT RTRN 9FT ADLT (ELECTROSURGICAL) ×1 IMPLANT
GLOVE BIO SURGEON STRL SZ 6.5 (GLOVE) ×2 IMPLANT
GLOVE BIOGEL PI IND STRL 7.0 (GLOVE) ×1 IMPLANT
GLOVE BIOGEL PI INDICATOR 7.0 (GLOVE) ×1
GOWN STRL REUS W/TWL LRG LVL3 (GOWN DISPOSABLE) ×4 IMPLANT
HOVERMATT SINGLE USE (MISCELLANEOUS) ×1 IMPLANT
NS IRRIG 1000ML POUR BTL (IV SOLUTION) ×2 IMPLANT
PACK C SECTION WH (CUSTOM PROCEDURE TRAY) ×2 IMPLANT
PAD OB MATERNITY 4.3X12.25 (PERSONAL CARE ITEMS) ×2 IMPLANT
PENCIL SMOKE EVAC W/HOLSTER (ELECTROSURGICAL) ×2 IMPLANT
RETAINER VISCERAL (MISCELLANEOUS) ×1 IMPLANT
RTRCTR C-SECT PINK 25CM LRG (MISCELLANEOUS) ×2 IMPLANT
STRIP CLOSURE SKIN 1/2X4 (GAUZE/BANDAGES/DRESSINGS) ×1 IMPLANT
SUT CHROMIC 1 CTX 36 (SUTURE) ×5 IMPLANT
SUT PLAIN 2 0 XLH (SUTURE) ×2 IMPLANT
SUT VIC AB 0 CT1 27 (SUTURE) ×4
SUT VIC AB 0 CT1 27XBRD ANBCTR (SUTURE) ×2 IMPLANT
SUT VIC AB 2-0 CT1 27 (SUTURE) ×2
SUT VIC AB 2-0 CT1 TAPERPNT 27 (SUTURE) ×1 IMPLANT
SUT VIC AB 4-0 KS 27 (SUTURE) ×2 IMPLANT
TOWEL OR 17X24 6PK STRL BLUE (TOWEL DISPOSABLE) ×2 IMPLANT
WATER STERILE IRR 1000ML POUR (IV SOLUTION) ×2 IMPLANT

## 2019-03-30 NOTE — Progress Notes (Signed)
Discussed with Dr. Mindi Slicker about using the patients personal glucose reader for CBG results for the endotool. Dr. Mindi Slicker confirmed the use of glucose reader/results from patients phone instead of containing blood sample CBG.

## 2019-03-30 NOTE — Progress Notes (Addendum)
Pamela Duarte is a 34 y.o. G1P0 at [redacted]w[redacted]d by ultrasound admitted for induction of labor due to Diabetes and Elective at term.  Subjective:   Objective: BP 120/65   Pulse (!) 108   Temp 99.2 F (37.3 C) (Oral)   Resp 17   Ht 5\' 7"  (1.702 m)   Wt 125 kg   LMP 06/25/2018   SpO2 98%   BMI 43.16 kg/m  No intake/output data recorded. Total I/O In: -  Out: 650 [Urine:650]   Blood glucose: 84-125 from 1930p 03/29/19 - 0130a on 03/30/19  FHT:  FHR: 150 bpm, variability: moderate,  accelerations:  Present,  decelerations:  Absent UC:   irregular, every 2-4 minutes SVE:   Dilation: 5.5 Effacement (%): 80 Station: -2 Exam by:: K. Cowher RN  Labs: Lab Results  Component Value Date   WBC 9.2 03/29/2019   HGB 11.0 (L) 03/29/2019   HCT 34.4 (L) 03/29/2019   MCV 84.9 03/29/2019   PLT 279 03/29/2019    Assessment / Plan: IOL due to Type 2 DM at term; stalled at 5cm dilation s/p cytotec x 2 with foley bulb for iol; now on pitocin at 03/31/2019, s/p epidural - comfortable  Had last two Blood sugars >120 ( 122 and 125) so endotool initiated  Labor: IUPC placed; inadequate mvus noted. Increase pitocin per protocol. till adequate Preeclampsia:  n/a Fetal Wellbeing:  Category I; resolved fetal tachycardia after fluid bolus, improved variability; resolution of variable decels  Pain Control:  Epidural I/D:  n/a Anticipated MOD:  NSVD. Counseled on indications for cesarean section such as chorioamnionitis and persistent cat 2 strip remote from delivery, cat 3 strip.   03/30/2019, 1:56 AM

## 2019-03-30 NOTE — Progress Notes (Signed)
Pt checked blood sugar on person machine with reading of 85. Insulin restarted at 0.6 ml/hr. Will continue to monitor hourly.

## 2019-03-30 NOTE — Progress Notes (Signed)
Inpatient Diabetes Program Recommendations  AACE/ADA: New Consensus Statement on Inpatient Glycemic Control (2015)  Target Ranges:  Prepandial:   less than 140 mg/dL      Peak postprandial:   less than 180 mg/dL (1-2 hours)      Critically ill patients:  140 - 180 mg/dL   Lab Results  Component Value Date   HGBA1C 6.8 (H) 03/30/2019    Diabetes history: Type 2 DM Outpatient Diabetes medications: Metformin 500 mg BID Current orders for Inpatient glycemic control: IV insulin  Inpatient Diabetes Program Recommendations:    With discontinuation of Endotool and s/p CS would recommend adding: -Metformin 500 mg BID -Novolog 0-9 units TID & HS.   Thanks, Lujean Rave, MSN, RNC-OB Diabetes Coordinator [phone] (8a-5p)

## 2019-03-30 NOTE — Progress Notes (Signed)
Pt checked blood sugar on personal machine, CBG is101. Will continue to monitor.  Caylyn Tedeschi RN 

## 2019-03-30 NOTE — Progress Notes (Signed)
Pt checked blood sugar on personal machine, CBG is125.  Dr. Mindi Slicker notified of two consecutive CBG results >120 Will continue to monitor.  Delfino Lovett Alyx Mcguirk RN  Control and instrumentation engineer

## 2019-03-30 NOTE — Progress Notes (Signed)
Pt checked blood sugar on personal machine with reading of 108. Insulin adjusted to 1.5 units/hr. Will continue to monitor hourly.

## 2019-03-30 NOTE — Progress Notes (Signed)
Pt checked blood sugar on personal machine with reading of 83. Insulin stopped per endo tool. Will continue to monitor hourly.

## 2019-03-30 NOTE — Progress Notes (Signed)
Patient ID: Pamela Duarte, female   DOB: 1985/05/07, 34 y.o.   MRN: [id number] Pt still comfortable with epidural.No complaints VSS: T 99.5 EFM - cat 1 TOCO - ctxs q 2-4 mins Pit at ; MVUs 160-200 SVE 5/90/-2  A/P: Continue with current management          Increase pitocin till adequate mvus         Monitor temp

## 2019-03-30 NOTE — Op Note (Signed)
Operative Note    Preoperative Diagnosis Term pregnancy at 38 5/7 weeks Type 2 Diabetes Arrest of Dilation Category 2 tracing  Postoperative Diagnosis Same with macrosomic infant  Procedure Low transverse c-section with 2 layer closure of uterus  Surgeon Huel Cote, MD  Anesthesia Epidural  Fluids: EBL UOP clear IVF  Findings A viable female infant in the vertex presentation.  Apgars 9,9 with weight pending but appears macrosomic.  Nml uterus and tubes.  Ovaries with PCOS appearance.  Superficial abdominal wall scarring from prior abdominoplasty.  Specimen Placenta to L&D  Procedure Note Patient was taken to the operating room where epidural anesthesia was boosted and though it took longer than normal, eventually found to be adequate by Allis clamp test. She was prepped and draped in the normal sterile fashion in the dorsal supine position with a leftward tilt. An appropriate time out was performed. A Pfannenstiel skin incision was then made with the scalpel and carried through to the underlying layer of fascia by sharp dissection and Bovie cautery.  There was some superficial scarring of the fascia to the underlying rectus muscles and old suture still present and removed.   The fascia was nicked in the midline and the incision was extended laterally with Mayo scissors. The inferior aspect of the incision was grasped Coker clamps and dissected off the underlying rectus muscles. In a similar fashion the superior aspect was dissected off the rectus muscles. Rectus muscles were separated in the midline and the peritoneal cavity entered bluntly. The peritoneal incision was then extended both superiorly and inferiorly with careful attention to avoid both bowel and bladder. The Alexis self-retaining wound retractor was then placed within the incision and the lower uterine segment exposed. The bladder flap was developed with Metzenbaum scissors and pushed away from  the lower uterine segment. The lower uterine segment was then incised in a transverse fashion and the cavity itself entered bluntly. The incision was extended bluntly. The infant's head was then lifted and delivered from the incision without difficulty. The remainder of the infant delivered and the nose and mouth bulb suctioned with the cord clamped and cut as well. The infant was handed off to the waiting pediatricians. The placenta was then spontaneously expressed from the uterus and the uterus cleared of all clots and debris with moist lap sponge. The uterine incision was then repaired in 2 layers the first layer was a running locked layer 1-0 chromic and the second an imbricating layer of the same suture. The tubes and ovaries were inspected and the gutters cleared of all clots and debris. The uterine incision was inspected and found to be hemostatic. All instruments and sponges as well as the Alexis retractor were then removed from the abdomen. The rectus muscles and peritoneum were then reapproximated with a running suture of 2-0 Vicryl, using the FISH retractor to hold back the bowel. The fascia was then closed with 0 Vicryl in a running fashion. Subcutaneous tissue was reapproximated with 3-0 plain in a running fashion. The skin was closed with a subcuticular stitch of 4-0 Vicryl on a Keith needle and then reinforced with benzoin and Steri-Strips. At the conclusion of the procedure all instruments and sponge counts were correct. Patient was taken to the recovery room in good condition with her baby accompanying her skin to skin.

## 2019-03-30 NOTE — Progress Notes (Signed)
Pt checked blood sugar on personal machine with reading of 110. Insulin increased to 1.5 ml/hr. Will continue to monitor hourly.

## 2019-03-30 NOTE — Progress Notes (Signed)
Pt checked blood sugar on personal machine, CBG is 120. Will continue to monitor.  Delfino Lovett Mychele Seyller RN

## 2019-03-30 NOTE — Progress Notes (Signed)
Patient ID: Pamela Duarte, female   DOB: June 20, 1985, 34 y.o.   MRN: [id number] Pt laboring on peanut and states feeling some pressure 99.3 temp  FHR with good variability and no significant decels--baseline 165  80/6.5/-2  Pt feeling some pressure, but only 1 cm of cervical change in last 9 hours D/w pt she is off the labor curve and that I am concerned at lack of meaningful progress which may indicate arrest of dilation.  We discussed that if no further progress made will need to proceed with c-section.  Reviewed the c-section process as well as risks and benefits of surgery including bleeding, infection and possible damage to bowel and bladder.  Pt understands and agreeable.  Will recheck in next 2-3 hours and if no progress proceed to c-section unless FHR indicates need to proceed sooner.

## 2019-03-30 NOTE — Progress Notes (Signed)
Patient ID: Pamela Duarte, female   DOB: 1985/01/24, 34 y.o.   MRN: [id number] FHR had a run of late decels so pitocin turned off. Examined and cervix unchanged  80/6/-2  D/w pt I recommend proceeding with c-section now and she is agreeable.  Late decels resolved with pitocin off, but will proceed as soon as OR ready.  I am turning off the endotool because BS have been reasonable.   Unasyn for prophylaxis given persistent low grade temperature.

## 2019-03-30 NOTE — Progress Notes (Addendum)
Patient ID: Pamela Duarte, female   DOB: [birth year], 33 y.o.   MRN: [id number] Pt with no complaints. Has rested overnight VSS EFM - 165, moderate variability, cat 2 TOCO - ctxs q 1-38mins SVE - 6.5/90/-2  Blood sugar 112 Bedside US confirms vertex  A/P: Progressing in labor, pitocin now at 19mu; mvus 190; increase till adequate         Amnioinfusion now 300/150         Discussed indications for cesarean section

## 2019-03-30 NOTE — Progress Notes (Signed)
Pt checked blood sugar on personal machine, CBG is122. Will continue to monitor.  Delfino Lovett Ammanda Dobbins RN

## 2019-03-30 NOTE — Progress Notes (Signed)
Pt checked blood sugar on personal machine with reading of 115.

## 2019-03-30 NOTE — Progress Notes (Signed)
Went into room for rounds and pt. Stated that she removed her own IV catheter in right upper arm. Site had been SL but pt stated site was leaking.

## 2019-03-30 NOTE — Lactation Note (Addendum)
This note was copied from a baby's chart. Lactation Consultation Note  Patient Name: Pamela Duarte TDHRC'B Date: 03/30/2019 Reason for consult: Initial assessment;1st time breastfeeding;Early term 37-38.6wks P1, 10 hour ETI  Femae LGA infant.  LC change a large void diaper while in room. LC notice mom right breast only is a little short shafted, infant latched, 20 mm NS given only for right breast if needed.  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 Mom hx: PCOS, T2DM mom on metformin and insulin pump. Tools given: DEBP to help establish milk supply, sleepy infant less than 24 hours. Infant has already breastfed for 6 times short intervals 5 minutes on and off breast, Per mom,  initially with first latch infant breastfed for 20 minutes. Per mom ,infant is very sleepy, mom attempted breastfeed infant in football hold on right breast but infant did not breastfeed long very sleepy. Infant blood sugars are stable. Mom is doing STS with infant as much as possible, mom will breastfeed according to hunger cues, 8 to 12 times within 24 hours and not allow baby to go past 3 hours without feeding. Mom knows to call RN or LC if she needs assistance with latching infant at breast. Mom will use DEBP every 3 hours for 15 minutes on initial setting and hand express after pumping to help establish milk supply. LC discussed breastfeeding support after discharge: LC hot line, LC outpatient clinic and LC online breastfeeding support group.  Maternal Data Formula Feeding for Exclusion: No Has patient been taught Hand Expression?: Yes Does the patient have breastfeeding experience prior to this delivery?: No  Feeding Feeding Type: Breast Fed  LATCH Score Latch: Too sleepy or reluctant, no latch achieved, no sucking elicited.  Audible Swallowing: A few with stimulation  Type of Nipple: Everted at rest and after stimulation  Comfort (Breast/Nipple): Soft / non-tender  Hold (Positioning):  Assistance needed to correctly position infant at breast and maintain latch.  LATCH Score: 6  Interventions Interventions: Breast feeding basics reviewed;Breast compression;Assisted with latch;Adjust position;Skin to skin;Support pillows;Breast massage;Position options;Hand express;Expressed milk;DEBP  Lactation Tools Discussed/Used Tools: Pump Breast pump type: Double-Electric Breast Pump WIC Program: No Pump Review: Setup, frequency, and cleaning;Milk Storage Initiated by:: Danelle Earthly, IBCLC Date initiated:: 03/30/19   Consult Status Consult Status: Follow-up Date: 03/31/19 Follow-up type: In-patient    Danelle Earthly 03/30/2019, 10:35 PM

## 2019-03-30 NOTE — Progress Notes (Signed)
Patient ID: Pamela Duarte, female   DOB: October 11, 1985, 34 y.o.   MRN: [id number] Pt reports significant itching and some incisional pain.  Has been treated with benadryl and nubain Has not had full meal yet, just some unsweet tea  BS 118  85 BP elevated earlier this PM with itching and discomfort , now 145/88 Will repeat PIH labs in AM and follow tonight

## 2019-03-30 NOTE — Transfer of Care (Signed)
Immediate Anesthesia Transfer of Care Note  Patient: Pamela Duarte  Procedure(s) Performed: CESAREAN SECTION (N/A Abdomen)  Patient Location: PACU  Anesthesia Type:Epidural  Level of Consciousness: awake, alert  and oriented  Airway & Oxygen Therapy: Patient Spontanous Breathing and Patient connected to nasal cannula oxygen  Post-op Assessment: Report given to RN and Post -op Vital signs reviewed and stable  Post vital signs: Reviewed and stable  Last Vitals:  Vitals Value Taken Time  BP 144/88 03/30/19 1248  Temp 36.5 C 03/30/19 1248  Pulse 102 03/30/19 1250  Resp 20 03/30/19 1250  SpO2 97 % 03/30/19 1250  Vitals shown include unvalidated device data.  Last Pain:  Vitals:   03/30/19 1248  TempSrc: Oral  PainSc:          Complications: No apparent anesthesia complications

## 2019-03-30 NOTE — Anesthesia Postprocedure Evaluation (Signed)
Anesthesia Post Note  Patient: Pamela Duarte  Procedure(s) Performed: CESAREAN SECTION (N/A Abdomen)     Patient location during evaluation: PACU Anesthesia Type: Epidural Level of consciousness: awake and alert Pain management: pain level controlled Vital Signs Assessment: post-procedure vital signs reviewed and stable Respiratory status: spontaneous breathing, nonlabored ventilation and respiratory function stable Cardiovascular status: stable Postop Assessment: no headache, no backache and epidural receding Anesthetic complications: no    Last Vitals:  Vitals:   03/30/19 1526 03/30/19 1636  BP: (!) 160/109 (!) 145/88  Pulse: 84 80  Resp: 18   Temp: 36.8 C   SpO2: 96%     Last Pain:  Vitals:   03/30/19 1636  TempSrc:   PainSc: 0-No pain   Pain Goal:                   Bashar Milam L Lannette Avellino

## 2019-03-30 NOTE — Progress Notes (Deleted)
Pt checked blood sugar on personal machine, CBG is101. Will continue to monitor.  Delfino Lovett Brendin Situ RN

## 2019-03-31 ENCOUNTER — Encounter: Payer: Self-pay | Admitting: *Deleted

## 2019-03-31 LAB — CBC
HCT: 26.7 % — ABNORMAL LOW (ref 36.0–46.0)
Hemoglobin: 8.8 g/dL — ABNORMAL LOW (ref 12.0–15.0)
MCH: 27.8 pg (ref 26.0–34.0)
MCHC: 33 g/dL (ref 30.0–36.0)
MCV: 84.2 fL (ref 80.0–100.0)
Platelets: 232 10*3/uL (ref 150–400)
RBC: 3.17 MIL/uL — ABNORMAL LOW (ref 3.87–5.11)
RDW: 14.9 % (ref 11.5–15.5)
WBC: 12.2 10*3/uL — ABNORMAL HIGH (ref 4.0–10.5)
nRBC: 0 % (ref 0.0–0.2)

## 2019-03-31 LAB — COMPREHENSIVE METABOLIC PANEL
ALT: 14 U/L (ref 0–44)
AST: 21 U/L (ref 15–41)
Albumin: 1.8 g/dL — ABNORMAL LOW (ref 3.5–5.0)
Alkaline Phosphatase: 65 U/L (ref 38–126)
Anion gap: 10 (ref 5–15)
BUN: 8 mg/dL (ref 6–20)
CO2: 21 mmol/L — ABNORMAL LOW (ref 22–32)
Calcium: 8.3 mg/dL — ABNORMAL LOW (ref 8.9–10.3)
Chloride: 106 mmol/L (ref 98–111)
Creatinine, Ser: 0.76 mg/dL (ref 0.44–1.00)
GFR calc Af Amer: >60 mL/min (ref >60)
GFR calc non Af Amer: >60 mL/min (ref >60)
Glucose, Bld: 109 mg/dL — ABNORMAL HIGH (ref 70–99)
Potassium: 3.6 mmol/L (ref 3.5–5.1)
Sodium: 137 mmol/L (ref 135–145)
Total Bilirubin: 0.4 mg/dL (ref 0.3–1.2)
Total Protein: 4.8 g/dL — ABNORMAL LOW (ref 6.5–8.1)

## 2019-03-31 NOTE — Lactation Note (Signed)
This note was copied from a baby's chart. Lactation Consultation Note  Patient Name: Pamela Duarte [id number] Date: 03/31/2019 Reason for consult: Follow-up assessment;Early term 37-38.6wks;Primapara;1st time breastfeeding  P1 mother whose infant is now 84 hours old.  This is an ETI at 38+5 weeks.    Baby was asleep in the bassinet when I arrived.  Mother has been attempting to latch, however, baby has been very sleepy with feedings.  Reassured mother that this is typical behavior the first 24 hours and that her daughter should begin to awaken more as the day progresses.  Discussed cluster feeding for tonight.  Encouraged to feed 8-12 times/24 hours or sooner if baby shows feeding cues.  She may awaken every three hours due to gestational age.  Mother's breasts are soft and non tender and nipples are everted and intact.  Mother has been practicing hand expression.  Encouraged to feed back any drops she is able to obtain to baby.  Colostrum container provided and milk storage times reviewed.  Finger feeding demonstrated.    Suggested mother call her RN for latch assistance as needed.  Mother has a DEBP at bedside and encouraged her to continue pumping after feeding for 15 minutes.  Mother has not seen any drops yet with pumping; supported her and provided emotional reassurance that she will eventually see colostrum with continued hand expression and pumping.    Mother does not have a DEBP for home use.  She has private insurance so I informed her on the process for obtaining a pump and also suggested she ask about eligibility for getting OP LC services provided after discharge.  Mother interested in doing all she can do to be successful with breast feeding.  Praised her for her efforts.  Mother will follow up with this.  Father present.       Maternal Data    Feeding    LATCH Score                   Interventions    Lactation Tools Discussed/Used     Consult  Status Consult Status: Follow-up Date: 04/01/19 Follow-up type: In-patient    Delanna Blacketer R Jabarri Stefanelli 03/31/2019, 11:26 AM

## 2019-03-31 NOTE — Progress Notes (Signed)
Pt blood sugar resulted as 92. Will check fasting in AM Dontray Haberland D Kathlen Brunswick

## 2019-03-31 NOTE — Progress Notes (Signed)
Subjective: Postpartum Day 1: Cesarean Delivery Patient reports incisional pain and tolerating PO.  Nl lochia, pain controlled Will monitor BP - mildly elevated, no sx's (nl LFTs and plts)  Objective: Vital signs in last 24 hours: Temp:  [97.7 F (36.5 C)-99.8 F (37.7 C)] 97.8 F (36.6 C) (03/18 0540) Pulse Rate:  [80-124] 96 (03/18 0540) Resp:  [10-29] 14 (03/18 0540) BP: (127-160)/(71-109) 146/94 (03/18 0540) SpO2:  [95 %-100 %] 95 % (03/18 0540) FSBS reviewed on pt's phone (has freestyle [place]) all well controlled <130  Physical Exam:  General: alert and no distress Lochia: appropriate Uterine Fundus: firm Incision: healing well DVT Evaluation: No evidence of DVT seen on physical exam.  Recent Labs    03/29/19 0853 03/31/19 0607  HGB 11.0* 8.8*  HCT 34.4* 26.7*    Assessment/Plan: Status post Cesarean section. Doing well postoperatively.  Continue current care.  Chestina Komatsu Bovard-Stuckert 03/31/2019, 8:17 AM

## 2019-04-01 MED ORDER — IBUPROFEN 800 MG PO TABS: 800.0000 mg | ORAL_TABLET | Freq: Three times a day (TID) | ORAL | 0 refills | Status: DC

## 2019-04-01 MED ORDER — OXYCODONE HCL 5 MG PO TABS: 5.0000 mg | ORAL_TABLET | ORAL | 0 refills | Status: DC | PRN

## 2019-04-01 NOTE — Discharge Instructions (Signed)
As per discharge pamphlet °

## 2019-04-01 NOTE — Progress Notes (Signed)
POD #2 LTCS, Type 2 DM Doing well, pain ok Afeb, VSS, BP normal Abd- soft, fundus firm, incision intact Continue routine care.  Glucose controlled so far with Metformin, BP ok on no meds

## 2019-04-01 NOTE — Progress Notes (Signed)
On assessment the honeycomb dressing was not present over the incision, pt stated it came off when she showered.  Incision was clean, dry, and edges were approximated.  Scant amount of old drainage noted on a few of the steri-strips.  Re-applied new dressing over incision per Dr. Jackelyn Knife order.  Dressing care reviewed with the patient.

## 2019-04-01 NOTE — Lactation Note (Signed)
This note was copied from a baby's chart. Lactation Consultation Note  Patient Name: Pamela Duarte BSWHQ'P Date: 04/01/2019 Reason for consult: Follow-up assessment;Early term 37-38.6wks;Infant weight loss P1, 42 hour female, LGA greater  Than 9 lbs with -7% weight loss. Infant had 4 voids but no stools.  Mom with hx: PCOS, T2DM. Mom is not longer using  NS, breast shaft is more extended today.  Per mom, infant is now latching and sustaining latch for 20 minutes most feedings. Mom has not been using DEBP as advised by [facility] for breast stimulation and to help establish milk supply. Mom latched infant on right breast using the football hold position, infant sustained latch and was still breastfeeding after  10 minutes when LC left  the room. Mom hand expressed and colostrum is present in breast. Mom was asking about supplementation,LC mention donor milk is available. LC reinforced the importance of pumping and doing hand expression afterwards to establish mom's milk supply. Mom knows to call RN or LC if she has any further questions, concerns or need assistance with latching infant at breast.   Maternal Data    Feeding Feeding Type: Breast Fed  LATCH Score Latch: Grasps breast easily, tongue down, lips flanged, rhythmical sucking.  Audible Swallowing: A few with stimulation  Type of Nipple: Everted at rest and after stimulation  Comfort (Breast/Nipple): Soft / non-tender  Hold (Positioning): Assistance needed to correctly position infant at breast and maintain latch.  LATCH Score: 8  Interventions Interventions: Assisted with latch;Adjust position;Support pillows;Skin to skin;Breast massage;Position options;Hand express;Expressed milk;DEBP;Breast compression  Lactation Tools Discussed/Used     Consult Status Consult Status: Follow-up Date: 04/01/19 Follow-up type: In-patient    Danelle Earthly 04/01/2019, 6:17 AM

## 2019-04-01 NOTE — Lactation Note (Signed)
This note was copied from a baby's chart. Lactation Consultation Note  Patient Name: Pamela Duarte MGNOI'B Date: 04/01/2019 Reason for consult: Follow-up assessment;Infant weight loss;Early term 37-38.6wks;Primapara;Maternal endocrine disorder Type of Endocrine Disorder?: Diabetes  Visited with mom of a 43 hours old ETI female, she's now doing both, breastmilk and formula. Mom and baby are going home today, baby is at 7% weight loss. Mom has not been pumping while at the hospital because "nothing came out" when she pumped.She has DM type 2 on metformin and Hx of PCOS. Explained to mom that the purpose of pumping early on is mainly for breast stimulation and not to get volume. She has a DEBP at home.  Reviewed discharge instructions, engorgement prevention/treatment and treatment/prevention of sore nipples. Encouraged mom to feed baby on cues at least 8-12 times/24 hours, she told LC they're only feeding every 4 hours or so. Dad present and helping mom with packing. Parents reported all questions and concerns were answered, they're both aware of LC OP services and will call if needed.   Maternal Data    Feeding Feeding Type: Bottle Fed - Formula Nipple Type: Slow - flow  LATCH Score                   Interventions Interventions: Breast feeding basics reviewed  Lactation Tools Discussed/Used     Consult Status Consult Status: Complete Date: 04/01/19 Follow-up type: Call as needed    Carrie Usery Venetia Constable 04/01/2019, 3:44 PM

## 2019-04-01 NOTE — Progress Notes (Signed)
Pt blood sugar 107 at 0600. Pt stated she ate a brownie and drink V8. Pamela Duarte

## 2019-04-01 NOTE — Discharge Summary (Signed)
OB Discharge Summary     Patient Name: PAT ELICKER DOB: [birth year] MRN: [id number]  Date of admission: 03/29/2019 Delivering MD: Huel Cote   Date of discharge: 04/01/2019  Admitting diagnosis: Normal labor and delivery [O80] Status post primary low transverse cesarean section [Z98.891] Intrauterine pregnancy: [redacted]w[redacted]d     Secondary diagnosis:  Active Problems:   Normal labor and delivery   Status post primary low transverse cesarean section      Discharge diagnosis: Term Pregnancy Delivered, Type 2 DM and arrest of dilation, FHR decels                                                                                                Hospital course:  Induction of Labor With Cesarean Section  34 y.o. yo G1P1001 at [redacted]w[redacted]d was admitted to the hospital 03/29/2019 for induction of labor. Patient had a labor course significant for protracted labor and FHR decels. The patient went for cesarean section due to Arrest of Dilation and FHR decels, and delivered a Viable infant,03/30/2019  Membrane Rupture Time/Date: 5:55 PM ,03/29/2019   Details of operation can be found in separate operative Note.  Patient had an uncomplicated postpartum course. She is ambulating, tolerating a regular diet, passing flatus, and urinating well. Glucose controlled with Metformin.   Patient is discharged home in stable condition on 04/01/19.                                    Physical exam  Vitals:   03/31/19 0540 03/31/19 1519 03/31/19 2059 04/01/19 0542  BP: (!) 146/94 138/84 126/86 132/89  Pulse: 96 96 (!) 107 96  Resp: 14 16 18 20   Temp: 97.8 F (36.6 C) 98.4 F (36.9 C) 98.6 F (37 C) 97.6 F (36.4 C)  TempSrc: Oral Oral Oral Oral  SpO2: 95% 98% 100% 99%  Weight:      Height:       General: alert Lochia: appropriate Uterine Fundus: firm Incision: Healing well with no significant drainage  Labs: Lab Results  Component Value Date   WBC 12.2 (H) 03/31/2019   HGB 8.8 (L) 03/31/2019   HCT  26.7 (L) 03/31/2019   MCV 84.2 03/31/2019   PLT 232 03/31/2019   CMP Latest Ref Rng & Units 03/31/2019  Glucose 70 - 99 mg/dL 04/02/2019)  BUN 6 - 20 mg/dL 8  Creatinine 413(K - 4.40 mg/dL 1.02  Sodium 7.25 - 366 mmol/L 137  Potassium 3.5 - 5.1 mmol/L 3.6  Chloride 98 - 111 mmol/L 106  CO2 22 - 32 mmol/L 21(L)  Calcium 8.9 - 10.3 mg/dL 8.3(L)  Total Protein 6.5 - 8.1 g/dL 4.8(L)  Total Bilirubin 0.3 - 1.2 mg/dL 0.4  Alkaline Phos 38 - 126 U/L 65  AST 15 - 41 U/L 21  ALT 0 - 44 U/L 14    Discharge instruction: per After Visit Summary and "Baby and Me Booklet".  After visit meds:  Allergies as of 04/01/2019      Reactions   Belviq [lorcaserin  Hcl] Nausea And Vomiting      Medication List    STOP taking these medications   aspirin EC 81 MG tablet   cephALEXin 250 MG capsule Commonly known as: KEFLEX   folic acid 245 MCG tablet Commonly known as: FOLVITE   insulin lispro 100 UNIT/ML injection Commonly known as: HUMALOG   insulin NPH Human 100 UNIT/ML injection Commonly known as: NOVOLIN N     TAKE these medications   FreeStyle Libre 14 Day Reader Kerrin Mo 1 application by Does not apply route daily. E11.9   FreeStyle Libre 14 Day Sensor Misc 1 application by Does not apply route daily.   glucose blood test strip Commonly known as: FREESTYLE TEST STRIPS Use as instructed Dx code: E11.9-Dispense Freestyle Libre test strips   ibuprofen 800 MG tablet Commonly known as: ADVIL Take 1 tablet (800 mg total) by mouth every 8 (eight) hours.   metFORMIN 500 MG tablet Commonly known as: GLUCOPHAGE Take 1,000 mg by mouth 2 (two) times daily with a meal.   oxyCODONE 5 MG immediate release tablet Commonly known as: Oxy IR/ROXICODONE Take 1-2 tablets (5-10 mg total) by mouth every 4 (four) hours as needed for severe pain.   prenatal multivitamin Tabs tablet Take 1 tablet by mouth daily at 12 noon.       Diet: routine diet  Activity: Advance as tolerated. Pelvic rest for 6  weeks.   Outpatient follow up:2 weeks  Newborn Data: Live born female  Birth Weight: 9 lb 8.8 oz (4332 g) APGAR: 53, 9  Newborn Delivery   Birth date/time: 03/30/2019 11:54:00 Delivery type: C-Section, Low Transverse Trial of labor: Yes C-section categorization: Primary      Baby Feeding: Breast Disposition:home with mother   04/01/2019 Clarene Duke, MD

## 2019-04-12 ENCOUNTER — Other Ambulatory Visit: Payer: Self-pay

## 2019-04-12 ENCOUNTER — Encounter (HOSPITAL_COMMUNITY): Payer: Self-pay | Admitting: Obstetrics and Gynecology

## 2019-04-12 ENCOUNTER — Inpatient Hospital Stay (HOSPITAL_COMMUNITY)
Admission: AD | Admit: 2019-04-12 | Discharge: 2019-04-14 | DRG: 776 | Disposition: A | Discharge status: Home / Self Care | Payer: BC Managed Care – PPO | Attending: Obstetrics and Gynecology | Admitting: Obstetrics and Gynecology

## 2019-04-12 DIAGNOSIS — Z7984 Long term (current) use of oral hypoglycemic drugs: Secondary | ICD-10-CM | POA: Diagnosis not present

## 2019-04-12 DIAGNOSIS — O902 Hematoma of obstetric wound: Secondary | ICD-10-CM | POA: Diagnosis not present

## 2019-04-12 DIAGNOSIS — O1415 Severe pre-eclampsia, complicating the puerperium: Secondary | ICD-10-CM | POA: Diagnosis not present

## 2019-04-12 DIAGNOSIS — Z20822 Contact with and (suspected) exposure to covid-19: Secondary | ICD-10-CM | POA: Diagnosis not present

## 2019-04-12 DIAGNOSIS — E119 Type 2 diabetes mellitus without complications: Secondary | ICD-10-CM | POA: Diagnosis not present

## 2019-04-12 DIAGNOSIS — K760 Fatty (change of) liver, not elsewhere classified: Secondary | ICD-10-CM | POA: Diagnosis not present

## 2019-04-12 DIAGNOSIS — R030 Elevated blood-pressure reading, without diagnosis of hypertension: Secondary | ICD-10-CM | POA: Diagnosis not present

## 2019-04-12 DIAGNOSIS — R238 Other skin changes: Secondary | ICD-10-CM | POA: Diagnosis not present

## 2019-04-12 DIAGNOSIS — O2413 Pre-existing diabetes mellitus, type 2, in the puerperium: Secondary | ICD-10-CM | POA: Diagnosis present

## 2019-04-12 DIAGNOSIS — R1900 Intra-abdominal and pelvic swelling, mass and lump, unspecified site: Secondary | ICD-10-CM | POA: Diagnosis not present

## 2019-04-12 DIAGNOSIS — O1495 Unspecified pre-eclampsia, complicating the puerperium: Secondary | ICD-10-CM | POA: Diagnosis present

## 2019-04-12 LAB — URINALYSIS, ROUTINE W REFLEX MICROSCOPIC
Bilirubin Urine: NEGATIVE
Glucose, UA: NEGATIVE mg/dL
Ketones, ur: NEGATIVE mg/dL
Nitrite: NEGATIVE
Protein, ur: 30 mg/dL — AB
Specific Gravity, Urine: 1.021 (ref 1.005–1.030)
WBC, UA: >50 WBC/hpf — ABNORMAL HIGH (ref 0–5)
pH: 6 (ref 5.0–8.0)

## 2019-04-12 LAB — GLUCOSE, CAPILLARY: Glucose-Capillary: 96 mg/dL (ref 70–99)

## 2019-04-12 LAB — COMPREHENSIVE METABOLIC PANEL
ALT: 18 U/L (ref 0–44)
AST: 18 U/L (ref 15–41)
Albumin: 2.9 g/dL — ABNORMAL LOW (ref 3.5–5.0)
Alkaline Phosphatase: 66 U/L (ref 38–126)
Anion gap: 12 (ref 5–15)
BUN: 6 mg/dL (ref 6–20)
CO2: 23 mmol/L (ref 22–32)
Calcium: 8.6 mg/dL — ABNORMAL LOW (ref 8.9–10.3)
Chloride: 103 mmol/L (ref 98–111)
Creatinine, Ser: 0.62 mg/dL (ref 0.44–1.00)
GFR calc Af Amer: >60 mL/min (ref >60)
GFR calc non Af Amer: >60 mL/min (ref >60)
Glucose, Bld: 83 mg/dL (ref 70–99)
Potassium: 3.7 mmol/L (ref 3.5–5.1)
Sodium: 138 mmol/L (ref 135–145)
Total Bilirubin: 0.4 mg/dL (ref 0.3–1.2)
Total Protein: 6.8 g/dL (ref 6.5–8.1)

## 2019-04-12 LAB — CBC
HCT: 30.2 % — ABNORMAL LOW (ref 36.0–46.0)
Hemoglobin: 9.6 g/dL — ABNORMAL LOW (ref 12.0–15.0)
MCH: 26.5 pg (ref 26.0–34.0)
MCHC: 31.8 g/dL (ref 30.0–36.0)
MCV: 83.4 fL (ref 80.0–100.0)
Platelets: 666 10*3/uL — ABNORMAL HIGH (ref 150–400)
RBC: 3.62 MIL/uL — ABNORMAL LOW (ref 3.87–5.11)
RDW: 14.6 % (ref 11.5–15.5)
WBC: 10.7 10*3/uL — ABNORMAL HIGH (ref 4.0–10.5)
nRBC: 0 % (ref 0.0–0.2)

## 2019-04-12 LAB — PROTEIN / CREATININE RATIO, URINE
Creatinine, Urine: 217.62 mg/dL
Protein Creatinine Ratio: 0.09 mg/mg{Cre} (ref 0.00–0.15)
Total Protein, Urine: 20 mg/dL

## 2019-04-12 MED ORDER — MAGNESIUM SULFATE BOLUS VIA INFUSION
4.0000 g | Freq: Once | INTRAVENOUS | Status: AC
  Administered 2019-04-12: 4 g via INTRAVENOUS
  Filled 2019-04-12: qty 1000

## 2019-04-12 MED ORDER — LABETALOL HCL 5 MG/ML IV SOLN: 80.0000 mg | INTRAVENOUS | Status: DC | PRN

## 2019-04-12 MED ORDER — METFORMIN HCL 500 MG PO TABS
1000.0000 mg | ORAL_TABLET | Freq: Two times a day (BID) | ORAL | Status: DC
  Administered 2019-04-12 – 2019-04-14 (×5): 1000 mg via ORAL
  Filled 2019-04-12 (×5): qty 2

## 2019-04-12 MED ORDER — MAGNESIUM SULFATE 40 GM/1000ML IV SOLN
2.0000 g/h | INTRAVENOUS | Status: AC
  Administered 2019-04-12 – 2019-04-13 (×2): 2 g/h via INTRAVENOUS
  Filled 2019-04-12 (×2): qty 1000

## 2019-04-12 MED ORDER — LABETALOL HCL 5 MG/ML IV SOLN: 40.0000 mg | INTRAVENOUS | Status: DC | PRN

## 2019-04-12 MED ORDER — NIFEDIPINE ER OSMOTIC RELEASE 30 MG PO TB24
30.0000 mg | ORAL_TABLET | Freq: Every day | ORAL | Status: DC
  Administered 2019-04-12 – 2019-04-14 (×3): 30 mg via ORAL
  Filled 2019-04-12 (×3): qty 1

## 2019-04-12 MED ORDER — HYDRALAZINE HCL 20 MG/ML IJ SOLN: 10.0000 mg | INTRAMUSCULAR | Status: DC | PRN

## 2019-04-12 MED ORDER — LABETALOL HCL 5 MG/ML IV SOLN
40.0000 mg | INTRAVENOUS | Status: DC | PRN
  Filled 2019-04-12: qty 8

## 2019-04-12 MED ORDER — LACTATED RINGERS IV SOLN: INTRAVENOUS | Status: DC

## 2019-04-12 MED ORDER — LABETALOL HCL 5 MG/ML IV SOLN
20.0000 mg | INTRAVENOUS | Status: DC | PRN
  Administered 2019-04-12: 20 mg via INTRAVENOUS
  Filled 2019-04-12: qty 4

## 2019-04-12 MED ORDER — LABETALOL HCL 5 MG/ML IV SOLN: 20.0000 mg | INTRAVENOUS | Status: DC | PRN

## 2019-04-12 NOTE — Plan of Care (Signed)
  Problem: Education: Goal: Knowledge of General Education information will improve Description: Including pain rating scale, medication(s)/side effects and non-pharmacologic comfort measures Outcome: Completed/Met

## 2019-04-12 NOTE — MAU Note (Addendum)
C/s on 3/17.  Went to PP visit, BP was high 140/100. +HA before visit- gone now, denies visual changes, epigastric pain or increase in swelling.  No BP problems with preg.

## 2019-04-12 NOTE — H&P (Signed)
Pamela Duarte is a 34 y.o. female G1P1001 after primary cesarean section on 03/30/19 for arrest of descent Pt was sent to MAU from office due to elevated blood pressures of 140s/100s. In MAU her systolic was noted as high as 168.  Pt admits to having had intermittent HAs. LFT wnl, plts 666; upr/cr 0.09. Pt also noted to have a mass firm mass in mid incisional area - reports pressure but no drainage; nontender.  Pt is a type 2 diabetic. She was managed on metformin and insulin in pregnancy but now only on metforminduring pregnancy. She had a history of recurrent utis in pregnancy so was on prolonged antibiotics - now stopped.  OB History    Gravida  1   Para  1   Term  1   Preterm      AB      Living  1     SAB      TAB      Ectopic      Multiple  0   Live Births  1          Past Medical History:  Diagnosis Date  . 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 PCOS (polycystic ovarian syndrome)    Past Surgical History:  Procedure Laterality Date  . ABDOMINOPLASTY  2015  . CESAREAN SECTION N/A 03/30/2019   Procedure: CESAREAN SECTION;  Surgeon: Paula Compton, MD;  Location: [facility];  Service: Obstetrics;  Laterality: N/A;  . COSMETIC SURGERY     Family History: family history includes Cancer in her paternal grandfather; Diabetes in her maternal grandmother, paternal grandfather, and paternal grandmother; Hypertension in her maternal grandmother, mother, paternal grandfather, and paternal grandmother. Social History:  reports that she has never smoked. She has never used smokeless tobacco. She reports that she does not drink alcohol or use drugs.     Maternal Diabetes: Yes:  Diabetes Type:  Pre-pregnancy Genetic Screening: Normal Maternal Ultrasounds/Referrals: Normal Fetal Ultrasounds or other Referrals:  Fetal echo wnl Maternal Substance Abuse:  No Significant Maternal Medications:  Meds include: Other: see HPI Significant  Maternal Lab Results:  Other: wnl Other Comments:  None  Review of Systems  Constitutional: Positive for appetite change. Negative for activity change, chills, diaphoresis and fatigue.  HENT: Negative for sore throat.   Eyes: Negative for photophobia and visual disturbance.  Respiratory: Negative for chest tightness and shortness of breath.   Cardiovascular: Negative for chest pain, palpitations and leg swelling.  Gastrointestinal: Negative for abdominal pain, nausea and vomiting.  Genitourinary: Negative for pelvic pain and vaginal bleeding.  Musculoskeletal: Negative for back pain.  Allergic/Immunologic: Negative for environmental allergies.  Neurological: Positive for headaches. Negative for light-headedness.  Psychiatric/Behavioral: The patient is nervous/anxious.    Maternal Medical History:  Reason for admission: Nausea. Preeclampsia with severe features  Prenatal Complications - Diabetes: type 2. Diabetes is managed by oral agent (monotherapy).        Blood pressure (!) 148/94, pulse 84, temperature 98.1 F (36.7 C), temperature source Oral, resp. rate 19, SpO2 99 %, currently breastfeeding. Maternal Exam:  Abdomen: Patient reports the following abdominal tenderness: incisional.    Physical Exam  Constitutional: She is oriented to person, place, and time. She appears well-developed and well-nourished.  Cardiovascular: Normal rate and intact distal pulses.  Respiratory: Effort normal.  GI: She exhibits mass.  Musculoskeletal:        General: No edema. Normal range of motion.  Cervical back: Normal range of motion.  Neurological: She is alert and oriented to person, place, and time.  Skin: Skin is warm.  Psychiatric: She has a normal mood and affect. Her behavior is normal. Judgment and thought content normal.    Prenatal labs: ABO, Rh: --/--/O POS, O POS Performed at [facility], [address] [place], [place] [address]  [phone]) Antibody: NEG  (03/16 0853) Rubella: Immune (08/12 0000) RPR: NON REACTIVE (03/16 0853)  HBsAg: Negative (08/12 0000)  HIV: Non-reactive (08/12 0000)  GBS: Positive/-- (02/24 0000)   Assessment/Plan: 34yo G1P1001 on POD # 13 s/p primary cesarean section for arrest of descent now admitted for preeclampsia with severe features ( elevated BP and HA)  - Admitted  - MgSO4 with 4gm bolus now running at 2gm/hr - stop after 24 hrs if stable  - Procardia for BP management - Type 2 DM - CBG q 4 hrs; metformin 1000mg  po bid - Suspected incisional hematoma - warm compresses now ; CT abdomen in am - D/C home once stable Jarrel Knoke W Tredarius Cobern 04/12/2019, 10:06 PM

## 2019-04-12 NOTE — MAU Provider Note (Signed)
History     CSN: [id number]  Arrival date and time: 04/12/19 1717   First Provider Initiated Contact with Patient 04/12/19 1823      Chief Complaint  Patient presents with  . Hypertension  . Headache   Pamela Duarte is a 34 y.o. G1P1001 at 13 days Postpartum s/p Primary C/S who receives care at [facility].  She presents today for Hypertension and Headache. She states she had her "regular postpartum c/s visit today" and her blood pressure was elevated.  She denies issues with blood pressure during the pregnancy.  Patient reports that earlier today, after a 3 hour appt for infant weight check and lactation consult, patient reports she had a headache.  However, she reports that the headache resolved with eating and rest.  Patient denies current headache, visual disturbances, SOB, and epigastric pain.  Patient is breastfeeding and supplementing.       OB History    Gravida  1   Para  1   Term  1   Preterm      AB      Living  1     SAB      TAB      Ectopic      Multiple  0   Live Births  1           Past Medical History:  Diagnosis Date  . 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 PCOS (polycystic ovarian syndrome)     Past Surgical History:  Procedure Laterality Date  . ABDOMINOPLASTY  2015  . CESAREAN SECTION N/A 03/30/2019   Procedure: CESAREAN SECTION;  Surgeon: Paula Compton, MD;  Location: [place] [facility];  Service: Obstetrics;  Laterality: N/A;  . COSMETIC SURGERY      Family History  Problem Relation Age of Onset  . Diabetes Maternal Grandmother   . Hypertension Maternal Grandmother   . Diabetes Paternal Grandfather   . Hypertension Paternal Grandfather   . Cancer Paternal Grandfather   . Hypertension Paternal Grandmother   . Diabetes Paternal Grandmother   . Hypertension Mother     Social History   Tobacco Use  . Smoking status: Never Smoker  . Smokeless tobacco: Never Used  Substance Use Topics   . Alcohol use: No  . Drug use: No    Allergies:  Allergies  Allergen Reactions  . Belviq [Lorcaserin Hcl] Nausea And Vomiting    Medications Prior to Admission  Medication Sig Dispense Refill Last Dose  . Continuous Blood Gluc Receiver (FREESTYLE LIBRE 14 DAY READER) DEVI 1 application by Does not apply route daily. E11.9 1 Device 0 04/11/2019 at Unknown time  . Continuous Blood Gluc Sensor (FREESTYLE LIBRE 14 DAY SENSOR) MISC 1 application by Does not apply route daily. 6 each 4 04/11/2019 at Unknown time  . glucose blood (FREESTYLE TEST STRIPS) test strip Use as instructed Dx code: E11.9-Dispense Freestyle Libre test strips 100 each 12 04/11/2019 at Unknown time  . ibuprofen (ADVIL) 800 MG tablet Take 1 tablet (800 mg total) by mouth every 8 (eight) hours. 30 tablet 0 04/12/2019 at Unknown time  . metFORMIN (GLUCOPHAGE) 500 MG tablet Take 1,000 mg by mouth 2 (two) times daily with a meal.   04/12/2019 at Unknown time  . oxyCODONE (OXY IR/ROXICODONE) 5 MG immediate release tablet Take 1-2 tablets (5-10 mg total) by mouth every 4 (four) hours as needed for severe pain. 15 tablet 0 Past Week at Unknown time  .  Prenatal Vit-Fe Fumarate-FA (PRENATAL MULTIVITAMIN) TABS tablet Take 1 tablet by mouth daily at 12 noon.   04/12/2019 at Unknown time    Review of Systems  Constitutional: Negative for chills and fever.  Eyes: Negative for visual disturbance.  Respiratory: Negative for cough and shortness of breath.   Gastrointestinal: Negative for constipation, diarrhea, nausea and vomiting.  Genitourinary: Positive for vaginal bleeding ( Reddish color). Negative for difficulty urinating, dysuria and vaginal discharge.  Neurological: Positive for headaches (Earlier, resolved with nap.). Negative for dizziness and light-headedness.   Physical Exam   Blood pressure (!) 165/96, pulse 90, temperature 98.5 F (36.9 C), temperature source Oral, resp. rate 18, SpO2 100 %, currently breastfeeding.    Vitals:   04/12/19 1800 04/12/19 1801 04/12/19 1815 04/12/19 1816  BP:  (!) 155/91  (!) 165/96  Pulse:  95  90  Resp:      Temp:      TempSrc:      SpO2: 99%  100%     Physical Exam  Constitutional: She is oriented to person, place, and time. She appears well-developed and well-nourished.  HENT:  Head: Normocephalic and atraumatic.  Eyes: Conjunctivae are normal.  Cardiovascular: Normal rate, regular rhythm and normal heart sounds.  Respiratory: Effort normal and breath sounds normal.  GI: Soft. Bowel sounds are normal. There is no abdominal tenderness.  Horizontal scar from hip to hip s/t abdominoplasty LT Incision well healed, no erythema, no drainage.  ~5cm mass (questionable hematoma) noted above symphysis pubis, but below incision.   Musculoskeletal:     Cervical back: Normal range of motion.  Neurological: She is alert and oriented to person, place, and time.  Skin: Skin is warm and dry.  Psychiatric: She has a normal mood and affect. Her behavior is normal.    MAU Course  Procedures Results for orders placed or performed during the hospital encounter of 04/12/19 (from the past 24 hour(s))  CBC     Status: Abnormal   Collection Time: 04/12/19  5:51 PM  Result Value Ref Range   WBC 10.7 (H) 4.0 - 10.5 K/uL   RBC 3.62 (L) 3.87 - 5.11 MIL/uL   Hemoglobin 9.6 (L) 12.0 - 15.0 g/dL   HCT 01.7 (L) 79.3 - 90.3 %   MCV 83.4 80.0 - 100.0 fL   MCH 26.5 26.0 - 34.0 pg   MCHC 31.8 30.0 - 36.0 g/dL   RDW 00.9 23.3 - 00.7 %   Platelets 666 (H) 150 - 400 K/uL   nRBC 0.0 0.0 - 0.2 %  Urinalysis, Routine w reflex microscopic     Status: Abnormal   Collection Time: 04/12/19  6:00 PM  Result Value Ref Range   Color, Urine YELLOW YELLOW   APPearance HAZY (A) CLEAR   Specific Gravity, Urine 1.021 1.005 - 1.030   pH 6.0 5.0 - 8.0   Glucose, UA NEGATIVE NEGATIVE mg/dL   Hgb urine dipstick MODERATE (A) NEGATIVE   Bilirubin Urine NEGATIVE NEGATIVE   Ketones, ur NEGATIVE NEGATIVE  mg/dL   Protein, ur 30 (A) NEGATIVE mg/dL   Nitrite NEGATIVE NEGATIVE   Leukocytes,Ua LARGE (A) NEGATIVE   RBC / HPF 11-20 0 - 5 RBC/hpf   WBC, UA >50 (H) 0 - 5 WBC/hpf   Bacteria, UA MANY (A) NONE SEEN   Squamous Epithelial / LPF 0-5 0 - 5   Mucus PRESENT    Budding Yeast PRESENT   Protein / creatinine ratio, urine     Status: None  Collection Time: 04/12/19  6:00 PM  Result Value Ref Range   Creatinine, Urine 217.62 mg/dL   Total Protein, Urine 20 mg/dL   Protein Creatinine Ratio 0.09 0.00 - 0.15 mg/mg[Cre]    MDM Physical Exam Labs: CBC, CMP, PC Ratio Measure BPQ15 min EFM PreEclampsia Protocol Assessment and Plan  34 year old G1P1001 Postpartum State Severe Range BP  -Patient informed of POC which includes labs. -2nd severe range pressure noted while provider at bedside. -Nurse instructed to call IV and start medications per protocol. -Patient questions if she will be admitted and informed that it would be appropriate considering blood pressures. -Exam completed, Labs pending. -Dr. Adrian Blackwater consulted and updated on patient status and agrees with recommendation for admission. Advises: *Start MgSO4  -Dr. Britt Bottom called and informed of recommendation for admission s/t Severe range blood pressures. *Agreeable and requests placement of admission labs with MgSO4 at 4/2. *States awareness of questionable hematoma below incision. -Nurses and patient updated on POC.  Patient without questions or concerns -Care relinquished to Dr. Henrietta Hoover 04/12/2019, 6:23 PM

## 2019-04-13 ENCOUNTER — Inpatient Hospital Stay (HOSPITAL_COMMUNITY): Payer: BC Managed Care – PPO

## 2019-04-13 DIAGNOSIS — R1900 Intra-abdominal and pelvic swelling, mass and lump, unspecified site: Secondary | ICD-10-CM | POA: Diagnosis not present

## 2019-04-13 DIAGNOSIS — K760 Fatty (change of) liver, not elsewhere classified: Secondary | ICD-10-CM | POA: Diagnosis not present

## 2019-04-13 LAB — GLUCOSE, CAPILLARY
Glucose-Capillary: 126 mg/dL — ABNORMAL HIGH (ref 70–99)
Glucose-Capillary: 130 mg/dL — ABNORMAL HIGH (ref 70–99)
Glucose-Capillary: 118 mg/dL — ABNORMAL HIGH (ref 70–99)
Glucose-Capillary: 112 mg/dL — ABNORMAL HIGH (ref 70–99)
Glucose-Capillary: 146 mg/dL — ABNORMAL HIGH (ref 70–99)

## 2019-04-13 LAB — SARS CORONAVIRUS 2 (TAT 6-24 HRS): SARS Coronavirus 2: NEGATIVE

## 2019-04-13 MED ORDER — MELATONIN 3 MG PO TABS
6.0000 mg | ORAL_TABLET | Freq: Every day | ORAL | Status: DC
  Administered 2019-04-13: 6 mg via ORAL
  Filled 2019-04-13 (×2): qty 2

## 2019-04-13 MED ORDER — IBUPROFEN 800 MG PO TABS
800.0000 mg | ORAL_TABLET | Freq: Three times a day (TID) | ORAL | Status: DC | PRN
  Administered 2019-04-13 (×2): 800 mg via ORAL
  Filled 2019-04-13 (×2): qty 1

## 2019-04-13 MED ORDER — ACETAMINOPHEN 500 MG PO TABS
1000.0000 mg | ORAL_TABLET | Freq: Once | ORAL | Status: AC
  Administered 2019-04-13: 1000 mg via ORAL
  Filled 2019-04-13: qty 2

## 2019-04-13 MED ORDER — IOHEXOL 300 MG/ML  SOLN
100.0000 mL | Freq: Once | INTRAMUSCULAR | Status: AC | PRN
  Administered 2019-04-13: 100 mL via INTRAVENOUS

## 2019-04-13 MED ORDER — OXYCODONE HCL 5 MG PO TABS: 5.0000 mg | ORAL_TABLET | ORAL | Status: DC | PRN

## 2019-04-13 MED ORDER — OXYCODONE-ACETAMINOPHEN 5-325 MG PO TABS
1.0000 | ORAL_TABLET | Freq: Once | ORAL | Status: AC
  Administered 2019-04-13: 1 via ORAL
  Filled 2019-04-13: qty 1

## 2019-04-13 MED ORDER — OXYCODONE HCL 5 MG PO TABS: 10.0000 mg | ORAL_TABLET | ORAL | Status: DC | PRN

## 2019-04-13 NOTE — Progress Notes (Signed)
Patient ID: Pamela Duarte, female   DOB: 16-Jan-1985, 34 y.o.   MRN: [id number]  CT reveals post op fluid collection, no sign of infection.  Mg bolus at 19:30pm 3/30, will shut off at 24 hours  AFVSS  D/w pt Continue procardia XL - 30mg  qday

## 2019-04-13 NOTE — Progress Notes (Signed)
Subjective: Postpartum Day 14: Cesarean Delivery admitted w postpartum PreE also w DM2 Patient reports tolerating PO.  Tolerating Magnesium.  HA improving.  Urine w bacteria, leukocytes - send for Urine culture  Objective: Vital signs in last 24 hours: Temp:  [98.1 F (36.7 C)-98.5 F (36.9 C)] 98.1 F (36.7 C) (03/30 2044) Pulse Rate:  [80-98] 98 (03/31 0402) Resp:  [16-20] 16 (03/31 0700) BP: (127-168)/(64-97) 139/89 (03/31 0402) SpO2:  [99 %-100 %] 100 % (03/31 0402) Weight:  [117.9 kg] 117.9 kg (03/30 2044)  Physical Exam:  General: alert and no distress Lochia: appropriate Uterine Fundus: firm Incision: healing well/?hematoma DVT Evaluation: No evidence of DVT seen on physical exam.  Recent Labs    04/12/19 1751  HGB 9.6*  HCT 30.2*    Assessment/Plan: Status post Cesarean section. Doing well postoperatively.  Continue current care.  Magnesium Sulfate for 24 hours, plan for d/c in AM.    Pamela Duarte 04/13/2019, 7:39 AM

## 2019-04-13 NOTE — Plan of Care (Signed)
  Problem: Education: Goal: Knowledge of disease or condition will improve Outcome: Progressing   

## 2019-04-14 LAB — CULTURE, OB URINE: Culture: >=100000 — AB

## 2019-04-14 LAB — GLUCOSE, CAPILLARY
Glucose-Capillary: 101 mg/dL — ABNORMAL HIGH (ref 70–99)
Glucose-Capillary: 74 mg/dL (ref 70–99)

## 2019-04-14 MED ORDER — SULFAMETHOXAZOLE-TRIMETHOPRIM 800-160 MG PO TABS
1.0000 | ORAL_TABLET | Freq: Two times a day (BID) | ORAL | Status: DC
  Administered 2019-04-14 (×2): 1 via ORAL
  Filled 2019-04-14 (×2): qty 1

## 2019-04-14 MED ORDER — SULFAMETHOXAZOLE-TRIMETHOPRIM 800-160 MG PO TABS: 1.0000 | ORAL_TABLET | Freq: Two times a day (BID) | ORAL | 0 refills | Status: DC

## 2019-04-14 MED ORDER — NIFEDIPINE ER 30 MG PO TB24: 30.0000 mg | ORAL_TABLET | Freq: Every day | ORAL | 1 refills | Status: DC

## 2019-04-14 NOTE — Discharge Instructions (Signed)
Preeclampsia precautions 

## 2019-04-14 NOTE — Progress Notes (Signed)
HD #3, POD #15, LTCS, preeclampsia Feeling good, no problems, no PIH sx Afeb, VSS, BP 120-140/60-80 Abd- soft, fundus firm, incision healing well  Will continue on Procardia XL 30 mg today, monitor BP, consider discharge this PM if BP is stable.  Continue Metformin for Type II DM

## 2019-04-14 NOTE — Lactation Note (Signed)
Lactation Consultation Note  Patient Name: Pamela Duarte Date: 04/14/2019   Mom is P1, 15 postdelivery admitted 2 days ago for postpartum hypertension. Mother was set up with a DEBP upon admission. Mother is actively pumping at the moment, milk supply is plentiful.  Mother pumped and dumped after CT-Scan per MD instructions.  Reviewed importance of pumping frequency and pumping during the night time. Follow up with lactation services at pediatrician's office as needed. Encouraged to call with concerns prn while inpatient.     [name] A Higuera Ancidey 04/14/2019, 11:04 AM

## 2019-04-15 LAB — URINE CULTURE: Culture: >=100000 — AB

## 2019-04-19 NOTE — Discharge Summary (Signed)
Physician Discharge Summary  Patient ID: Pamela Duarte MRN: [id number] DOB/AGE: 20-Oct-1985 34 y.o.  Admit date: 04/12/2019 Discharge date: 04/14/2019  Admission Diagnoses:  Postpartum preeclampsia with severe features, Type 2 Diabetes, incisional hematoma  Discharge Diagnoses: Same Active Problems:   Preeclampsia in postpartum period   Discharged Condition: good  Hospital Course: Pt admitted by Dr. Mindi Slicker with elevated BP and headache. Normal PIH labs. She received magnesium for 24 hours for seizure prophylaxis, Procardia for BP control, continued on her current regimen to control her diabetes.  A CT scan of her abdomen was performed due to a possible hematoma, this revealed a 9x5 cm suprapubic hematoma, and a 19x2.5 cm fluid collection beneath her incision.  Her BP remained adequately controlled on Procardia, and on the evening of 4-1 she was felt to be stable for discharge.  She also had a + urine cx that eventually grew Klebsiella, she was started on Bactrim to which it was sensitive  Discharge Exam: Blood pressure (!) 142/80, pulse (!) 105, temperature 98.3 F (36.8 C), temperature source Oral, resp. rate 18, height 5\' 7"  (1.702 m), weight 117.9 kg, SpO2 97 %, currently breastfeeding. General appearance: alert  Disposition: Discharge disposition: 01-Home or Self Care       Discharge Instructions    Diet - low sodium heart healthy   Complete by: As directed    Lifting restrictions   Complete by: As directed    Weight restriction of 10 lbs.     Allergies as of 04/14/2019      Reactions   Belviq [lorcaserin Hcl] Nausea And Vomiting      Medication List    TAKE these medications   FreeStyle Libre 14 Day Reader 06/14/2019 1 application by Does not apply route daily. E11.9   FreeStyle Libre 14 Day Sensor Misc 1 application by Does not apply route daily.   glucose blood test strip Commonly known as: FREESTYLE TEST STRIPS Use as instructed Dx code: E11.9-Dispense  Freestyle Libre test strips   ibuprofen 800 MG tablet Commonly known as: ADVIL Take 1 tablet (800 mg total) by mouth every 8 (eight) hours.   metFORMIN 500 MG tablet Commonly known as: GLUCOPHAGE Take 1,000 mg by mouth 2 (two) times daily with a meal.   NIFEdipine 30 MG 24 hr tablet Commonly known as: ADALAT CC Take 1 tablet (30 mg total) by mouth daily.   oxyCODONE 5 MG immediate release tablet Commonly known as: Oxy IR/ROXICODONE Take 1-2 tablets (5-10 mg total) by mouth every 4 (four) hours as needed for severe pain.   prenatal multivitamin Tabs tablet Take 1 tablet by mouth daily at 12 noon.   sulfamethoxazole-trimethoprim 800-160 MG tablet Commonly known as: BACTRIM DS Take 1 tablet by mouth every 12 (twelve) hours.      Follow-up Information    Hardie Pulley, MD. Schedule an appointment as soon as possible for a visit in 1 week(s).   Specialty: Obstetrics and Gynecology Why: for BP check Contact information: [address] [place] [phone]           Signed: [phone] Larua Collier 04/19/2019, 8:25 AM

## 2019-04-29 ENCOUNTER — Encounter: Payer: Self-pay | Admitting: *Deleted

## 2019-05-13 DIAGNOSIS — O924 Hypogalactia: Secondary | ICD-10-CM | POA: Diagnosis not present

## 2019-05-13 DIAGNOSIS — Z1389 Encounter for screening for other disorder: Secondary | ICD-10-CM | POA: Diagnosis not present

## 2019-05-13 DIAGNOSIS — Z3009 Encounter for other general counseling and advice on contraception: Secondary | ICD-10-CM | POA: Diagnosis not present

## 2019-06-21 ENCOUNTER — Encounter: Payer: BC Managed Care – PPO | Admitting: Emergency Medicine

## 2019-07-20 ENCOUNTER — Ambulatory Visit: Payer: BC Managed Care – PPO | Admitting: Emergency Medicine

## 2019-07-26 ENCOUNTER — Other Ambulatory Visit: Payer: Self-pay

## 2019-07-26 ENCOUNTER — Encounter: Payer: Self-pay | Admitting: Emergency Medicine

## 2019-07-26 ENCOUNTER — Ambulatory Visit (INDEPENDENT_AMBULATORY_CARE_PROVIDER_SITE_OTHER): Payer: BC Managed Care – PPO | Admitting: Emergency Medicine

## 2019-07-26 VITALS — BP 132/89 | HR 72 | Temp 98.0°F | Resp 16 | Ht 67.0 in | Wt 258.6 lb

## 2019-07-26 DIAGNOSIS — Z1159 Encounter for screening for other viral diseases: Secondary | ICD-10-CM

## 2019-07-26 DIAGNOSIS — Z6841 Body Mass Index (BMI) 40.0 and over, adult: Secondary | ICD-10-CM | POA: Diagnosis not present

## 2019-07-26 DIAGNOSIS — E118 Type 2 diabetes mellitus with unspecified complications: Secondary | ICD-10-CM

## 2019-07-26 DIAGNOSIS — E1159 Type 2 diabetes mellitus with other circulatory complications: Secondary | ICD-10-CM | POA: Diagnosis not present

## 2019-07-26 DIAGNOSIS — L608 Other nail disorders: Secondary | ICD-10-CM

## 2019-07-26 DIAGNOSIS — R000 Tachycardia, unspecified: Secondary | ICD-10-CM

## 2019-07-26 DIAGNOSIS — I10 Essential (primary) hypertension: Secondary | ICD-10-CM

## 2019-07-26 DIAGNOSIS — I152 Hypertension secondary to endocrine disorders: Secondary | ICD-10-CM

## 2019-07-26 LAB — POCT GLYCOSYLATED HEMOGLOBIN (HGB A1C)
HbA1c POC (<> result, manual entry): 7.4 % (ref 4.0–5.6)
HbA1c, POC (controlled diabetic range): 7.4 % — AB (ref 0.0–7.0)
HbA1c, POC (prediabetic range): 7.4 % — AB (ref 5.7–6.4)
Hemoglobin A1C: 7.4 % — AB (ref 4.0–5.6)

## 2019-07-26 LAB — GLUCOSE, POCT (MANUAL RESULT ENTRY): POC Glucose: 136 mg/dl — AB (ref 70–99)

## 2019-07-26 MED ORDER — AMLODIPINE BESYLATE 5 MG PO TABS: 5.0000 mg | ORAL_TABLET | Freq: Every day | ORAL | 3 refills | Status: DC

## 2019-07-26 MED ORDER — METFORMIN HCL 500 MG PO TABS: 1000.0000 mg | ORAL_TABLET | Freq: Two times a day (BID) | ORAL | 3 refills | Status: DC

## 2019-07-26 MED ORDER — TRULICITY 0.75 MG/0.5ML ~~LOC~~ SOAJ: 0.7500 mg | SUBCUTANEOUS | 5 refills | Status: AC

## 2019-07-26 NOTE — Progress Notes (Signed)
Pamela Duarte 34 y.o.   Chief Complaint  Patient presents with  . Irregular Heart Beat    follow up from OB/GYN, Friday noticed a high heart rate 140 lasted about 5 minutes and med refill Metformin  . Toe Pain    LEFT toe and referral to Podiatry started 1 month ago   Admit date: 04/12/2019 Discharge date: 04/14/2019  Admission Diagnoses:  Postpartum preeclampsia with severe features, Type 2 Diabetes, incisional hematoma  Discharge Diagnoses: Same Active Problems:   Preeclampsia in postpartum period   Discharged Condition: good  Hospital Course: Pt admitted by Dr. Mindi Slicker with elevated BP and headache. Normal PIH labs. She received magnesium for 24 hours for seizure prophylaxis, Procardia for BP control, continued on her current regimen to control her diabetes.  A CT scan of her abdomen was performed due to a possible hematoma, this revealed a 9x5 cm suprapubic hematoma, and a 19x2.5 cm fluid collection beneath her incision.  Her BP remained adequately controlled on Procardia, and on the evening of 4-1 she was felt to be stable for discharge.  She also had a + urine cx that eventually grew Klebsiella, she was started on Bactrim to which it was sensitive   HISTORY OF PRESENT ILLNESS: This is a 34 y.o. female with history of diabetes and hypertension, used to see Dr. Creta Levin, first visit with me for follow-up of chronic medical conditions.  Recently developed post partum preeclampsia and was started on nifedipine but has been off medication for the past month with normal blood pressure readings at home.  However last Friday had episode of tachycardia that lasted about 5 minutes and was recorded as A. fib on her apple watch.  No syncope or near syncope episode.  No chest pain or trouble breathing. Has history of diabetes presently on Metformin 1000 mg twice a day. Lab Results  Component Value Date   HGBA1C 6.8 (H) 03/30/2019   BP Readings from Last 3 Encounters:  07/26/19  (!) 142/100  04/14/19 (!) 142/80  04/01/19 132/89     HPI   Prior to Admission medications   Medication Sig Start Date End Date Taking? Authorizing Provider  Continuous Blood Gluc Receiver (FREESTYLE LIBRE 14 DAY READER) DEVI 1 application by Does not apply route daily. E11.9 06/25/17  Yes Stallings, Zoe A, MD  Continuous Blood Gluc Sensor (FREESTYLE LIBRE 14 DAY SENSOR) MISC 1 application by Does not apply route daily. 08/11/18  Yes Stallings, Zoe A, MD  glucose blood (FREESTYLE TEST STRIPS) test strip Use as instructed Dx code: E11.9-Dispense Freestyle Libre test strips 07/26/17  Yes Stallings, Zoe A, MD  ibuprofen (ADVIL) 800 MG tablet Take 1 tablet (800 mg total) by mouth every 8 (eight) hours. 04/01/19  Yes Meisinger, Tawanna Cooler, MD  metFORMIN (GLUCOPHAGE) 500 MG tablet Take 1,000 mg by mouth 2 (two) times daily with a meal.   Yes [provider]  NIFEdipine (ADALAT CC) 30 MG 24 hr tablet Take 1 tablet (30 mg total) by mouth daily. 04/15/19  Yes Meisinger, Tawanna Cooler, MD  oxyCODONE (OXY IR/ROXICODONE) 5 MG immediate release tablet Take 1-2 tablets (5-10 mg total) by mouth every 4 (four) hours as needed for severe pain. 04/01/19  Yes Meisinger, Todd, MD  Prenatal Vit-Fe Fumarate-FA (PRENATAL MULTIVITAMIN) TABS tablet Take 1 tablet by mouth daily at 12 noon.   Yes [provider]  sulfamethoxazole-trimethoprim (BACTRIM DS) 800-160 MG tablet Take 1 tablet by mouth every 12 (twelve) hours. 04/14/19  Yes Meisinger, Tawanna Cooler, MD  Allergies  Allergen Reactions  . Belviq [Lorcaserin Hcl] Nausea And Vomiting    Patient Active Problem List   Diagnosis Date Noted  . Preeclampsia in postpartum period 04/12/2019  . Status post primary low transverse cesarean section 03/30/2019  . Normal labor and delivery 03/29/2019  . Newly diagnosed diabetes (HCC) 06/26/2017  . Infertility counseling 06/26/2017  . Class 2 obesity due to excess calories without serious comorbidity with body mass index (BMI) of  39.0 to 39.9 in adult 06/26/2017    Past Medical History:  Diagnosis Date  . 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 PCOS (polycystic ovarian syndrome)     Past Surgical History:  Procedure Laterality Date  . ABDOMINOPLASTY  2015  . CESAREAN SECTION N/A 03/30/2019   Procedure: CESAREAN SECTION;  Surgeon: Huel Coteichardson, Kathy, MD;  Location: [facility];  Service: Obstetrics;  Laterality: N/A;  . COSMETIC SURGERY      Social History   Socioeconomic History  . Marital status: Married    Spouse name: Not on file  . Number of children: Not on file  . Years of education: Not on file  . Highest education level: Not on file  Occupational History  . Not on file  Tobacco Use  . Smoking status: Never Smoker  . Smokeless tobacco: Never Used  Vaping Use  . Vaping Use: Never used  Substance and Sexual Activity  . Alcohol use: No  . Drug use: No  . Sexual activity: Not Currently  Other Topics Concern  . Not on file  Social History Narrative  . Not on file   Social Determinants of Health   Financial Resource Strain:   . Difficulty of Paying Living Expenses:   Food Insecurity:   . Worried About Programme researcher, broadcasting/film/videounning Out of Food in the Last Year:   . Baristaan Out of Food in the Last Year:   Transportation Needs:   . Freight forwarderLack of Transportation (Medical):   Marland Kitchen. Lack of Transportation (Non-Medical):   Physical Activity:   . Days of Exercise per Week:   . Minutes of Exercise per Session:   Stress:   . Feeling of Stress :   Social Connections:   . Frequency of Communication with Friends and Family:   . Frequency of Social Gatherings with Friends and Family:   . Attends Religious Services:   . Active Member of Clubs or Organizations:   . Attends BankerClub or Organization Meetings:   Marland Kitchen. Marital Status:   Intimate Partner Violence:   . Fear of Current or Ex-Partner:   . Emotionally Abused:   Marland Kitchen. Physically Abused:   . Sexually Abused:     Family History  Problem Relation Age of  Onset  . Diabetes Maternal Grandmother   . Hypertension Maternal Grandmother   . Diabetes Paternal Grandfather   . Hypertension Paternal Grandfather   . Cancer Paternal Grandfather   . Hypertension Paternal Grandmother   . Diabetes Paternal Grandmother   . Hypertension Mother      Review of Systems  Constitutional: Negative.  Negative for chills and fever.  HENT: Negative.  Negative for congestion and sore throat.   Respiratory: Negative.  Negative for shortness of breath.   Cardiovascular: Positive for palpitations. Negative for chest pain.  Gastrointestinal: Negative for abdominal pain, nausea and vomiting.  Genitourinary: Negative.  Negative for dysuria.  Skin: Negative.  Negative for rash.  All other systems reviewed and are negative.    Physical Exam Vitals reviewed.  Constitutional:      Appearance: She is obese.  HENT:     Head: Normocephalic.  Eyes:     Extraocular Movements: Extraocular movements intact.     Conjunctiva/sclera: Conjunctivae normal.     Pupils: Pupils are equal, round, and reactive to light.  Cardiovascular:     Rate and Rhythm: Normal rate and regular rhythm.     Pulses: Normal pulses.     Heart sounds: Normal heart sounds.  Pulmonary:     Effort: Pulmonary effort is normal.     Breath sounds: Normal breath sounds.  Musculoskeletal:        General: Normal range of motion.     Cervical back: Normal range of motion and neck supple.     Comments: Left foot: Toenail of big toe partially detached from nail bed  Skin:    General: Skin is warm and dry.     Capillary Refill: Capillary refill takes less than 2 seconds.  Neurological:     General: No focal deficit present.     Mental Status: She is alert and oriented to person, place, and time.  Psychiatric:        Mood and Affect: Mood normal.        Behavior: Behavior normal.     Results for orders placed or performed in visit on 07/26/19 (from the past 24 hour(s))  POCT glucose (manual  entry)     Status: Abnormal   Collection Time: 07/26/19 11:51 AM  Result Value Ref Range   POC Glucose 136 (A) 70 - 99 mg/dl  POCT glycosylated hemoglobin (Hb A1C)     Status: Abnormal   Collection Time: 07/26/19 11:56 AM  Result Value Ref Range   Hemoglobin A1C 7.4 (A) 4.0 - 5.6 %   HbA1c POC (<> result, manual entry) 7.4 4.0 - 5.6 %   HbA1c, POC (prediabetic range) 7.4 (A) 5.7 - 6.4 %   HbA1c, POC (controlled diabetic range) 7.4 (A) 0.0 - 7.0 %   A total of 45 minutes was spent with the patient, greater than 50% of which was in counseling/coordination of care regarding diabetes and hypertension and cardiovascular risks associated with these conditions, review of all medications and new one Trulicity, how to use medication pen for injection, diet and nutrition, review of most recent blood work results including today's hemoglobin A1c, review of most recent office visit notes, review of most recent hospital stay discharge summary, discussion of tachyarrhythmias and need to follow-up with cardiologist, prognosis and need for follow-up in 3 months.  ASSESSMENT & PLAN: Hypertension associated with diabetes (HCC) Blood pressure reading elevated in the office.  Normal readings at home.  Advised to continue monitoring blood pressure readings at home and use amlodipine 5 mg daily if readings persistently high.  Do not take medication if readings persistently normal. Uncontrolled diabetes with hemoglobin A1c of 7.4.  Continue Metformin 1000 mg twice a day and start weekly Trulicity at 0.75 mg.  Diet and nutrition discussed. Follow-up in 3 months.  Jalise was seen today for irregular heart beat and toe pain.  Diagnoses and all orders for this visit:  Hypertension associated with diabetes (HCC) -     Comprehensive metabolic panel -     Lipid panel -     POCT glucose (manual entry) -     POCT glycosylated hemoglobin (Hb A1C) -     Microalbumin, urine -     metFORMIN (GLUCOPHAGE) 500 MG tablet;  Take 2 tablets (1,000 mg  total) by mouth 2 (two) times daily with a meal. -     Dulaglutide (TRULICITY) 0.75 MG/0.5ML SOPN; Inject 0.5 mLs (0.75 mg total) into the skin once a week.  Type 2 diabetes mellitus with complication, without long-term current use of insulin (HCC) -     HM Diabetes Foot Exam  Essential hypertension -     amLODipine (NORVASC) 5 MG tablet; Take 1 tablet (5 mg total) by mouth daily.  Body mass index (BMI) of 40.1-44.9 in adult [facility])  Need for hepatitis C screening test -     Hepatitis C antibody  Toenail deformity -     Ambulatory referral to Podiatry  Tachyarrhythmia -     Ambulatory referral to Cardiology    Patient Instructions   Continue Metformin 1000 mg twice a day. Start Trulicity 0.75 mg weekly. Monitor blood pressure readings at home.  If persistently high, start amlodipine 5 mg daily.  If normal readings, do not take medication. Follow-up in 3 months.    If you have lab work done today you will be contacted with your lab results within the next 2 weeks.  If you have not heard from [place] then please contact us. The fastest way to get your results is to register for My Chart.   IF you received an x-ray today, you will receive an invoice from [facility]. Please contact [facility] at [phone] with questions or concerns regarding your invoice.   IF you received labwork today, you will receive an invoice from [facility]. Please contact [facility] at [phone] with questions or concerns regarding your invoice.   Our billing staff will not be able to assist you with questions regarding bills from these companies.  You will be contacted with the lab results as soon as they are available. The fastest way to get your results is to activate your My Chart account. Instructions are located on the last page of this paperwork. If you have not heard from [place] regarding the results in 2 weeks, please contact this office.      Calorie  Counting for Weight Loss Calories are units of energy. Your body needs a certain amount of calories from food to keep you going throughout the day. When you eat more calories than your body needs, your body stores the extra calories as fat. When you eat fewer calories than your body needs, your body burns fat to get the energy it needs. Calorie counting means keeping track of how many calories you eat and drink each day. Calorie counting can be helpful if you need to lose weight. If you make sure to eat fewer calories than your body needs, you should lose weight. Ask your health care provider what a healthy weight is for you. For calorie counting to work, you will need to eat the right number of calories in a day in order to lose a healthy amount of weight per week. A dietitian can help you determine how many calories you need in a day and will give you suggestions on how to reach your calorie goal.  A healthy amount of weight to lose per week is usually 1-2 lb (0.5-0.9 kg). This usually means that your daily calorie intake should be reduced by 500-750 calories.  Eating 1,200 - 1,500 calories per day can help most women lose weight.  Eating 1,500 - 1,800 calories per day can help most men lose weight. What is my plan? My goal is to have __________ calories per day. If I  have this many calories per day, I should lose around __________ pounds per week. What do I need to know about calorie counting? In order to meet your daily calorie goal, you will need to:  Find out how many calories are in each food you would like to eat. Try to do this before you eat.  Decide how much of the food you plan to eat.  Write down what you ate and how many calories it had. Doing this is called keeping a food log. To successfully lose weight, it is important to balance calorie counting with a healthy lifestyle that includes regular activity. Aim for 150 minutes of moderate exercise (such as walking) or 75 minutes of  vigorous exercise (such as running) each week. Where do I find calorie information?  The number of calories in a food can be found on a Nutrition Facts label. If a food does not have a Nutrition Facts label, try to look up the calories online or ask your dietitian for help. Remember that calories are listed per serving. If you choose to have more than one serving of a food, you will have to multiply the calories per serving by the amount of servings you plan to eat. For example, the label on a package of bread might say that a serving size is 1 slice and that there are 90 calories in a serving. If you eat 1 slice, you will have eaten 90 calories. If you eat 2 slices, you will have eaten 180 calories. How do I keep a food log? Immediately after each meal, record the following information in your food log:  What you ate. Don't forget to include toppings, sauces, and other extras on the food.  How much you ate. This can be measured in cups, ounces, or number of items.  How many calories each food and drink had.  The total number of calories in the meal. Keep your food log near you, such as in a small notebook in your pocket, or use a mobile app or website. Some programs will calculate calories for you and show you how many calories you have left for the day to meet your goal. What are some calorie counting tips?   Use your calories on foods and drinks that will fill you up and not leave you hungry: ? Some examples of foods that fill you up are nuts and nut butters, vegetables, lean proteins, and high-fiber foods like whole grains. High-fiber foods are foods with more than 5 g fiber per serving. ? Drinks such as sodas, specialty coffee drinks, alcohol, and juices have a lot of calories, yet do not fill you up.  Eat nutritious foods and avoid empty calories. Empty calories are calories you get from foods or beverages that do not have many vitamins or protein, such as candy, sweets, and soda. It is  better to have a nutritious high-calorie food (such as an avocado) than a food with few nutrients (such as a bag of chips).  Know how many calories are in the foods you eat most often. This will help you calculate calorie counts faster.  Pay attention to calories in drinks. Low-calorie drinks include water and unsweetened drinks.  Pay attention to nutrition labels for "low fat" or "fat free" foods. These foods sometimes have the same amount of calories or more calories than the full fat versions. They also often have added sugar, starch, or salt, to make up for flavor that was removed with the fat.  Find a way of tracking calories that works for you. Get creative. Try different apps or programs if writing down calories does not work for you. What are some portion control tips?  Know how many calories are in a serving. This will help you know how many servings of a certain food you can have.  Use a measuring cup to measure serving sizes. You could also try weighing out portions on a kitchen scale. With time, you will be able to estimate serving sizes for some foods.  Take some time to put servings of different foods on your favorite plates, bowls, and cups so you know what a serving looks like.  Try not to eat straight from a bag or box. Doing this can lead to overeating. Put the amount you would like to eat in a cup or on a plate to make sure you are eating the right portion.  Use smaller plates, glasses, and bowls to prevent overeating.  Try not to multitask (for example, watch TV or use your computer) while eating. If it is time to eat, sit down at a table and enjoy your food. This will help you to know when you are full. It will also help you to be aware of what you are eating and how much you are eating. What are tips for following this plan? Reading food labels  Check the calorie count compared to the serving size. The serving size may be smaller than what you are used to  eating.  Check the source of the calories. Make sure the food you are eating is high in vitamins and protein and low in saturated and trans fats. Shopping  Read nutrition labels while you shop. This will help you make healthy decisions before you decide to purchase your food.  Make a grocery list and stick to it. Cooking  Try to cook your favorite foods in a healthier way. For example, try baking instead of frying.  Use low-fat dairy products. Meal planning  Use more fruits and vegetables. Half of your plate should be fruits and vegetables.  Include lean proteins like poultry and fish. How do I count calories when eating out?  Ask for smaller portion sizes.  Consider sharing an entree and sides instead of getting your own entree.  If you get your own entree, eat only half. Ask for a box at the beginning of your meal and put the rest of your entree in it so you are not tempted to eat it.  If calories are listed on the menu, choose the lower calorie options.  Choose dishes that include vegetables, fruits, whole grains, low-fat dairy products, and lean protein.  Choose items that are boiled, broiled, grilled, or steamed. Stay away from items that are buttered, battered, fried, or served with cream sauce. Items labeled "crispy" are usually fried, unless stated otherwise.  Choose water, low-fat milk, unsweetened iced tea, or other drinks without added sugar. If you want an alcoholic beverage, choose a lower calorie option such as a glass of wine or light beer.  Ask for dressings, sauces, and syrups on the side. These are usually high in calories, so you should limit the amount you eat.  If you want a salad, choose a garden salad and ask for grilled meats. Avoid extra toppings like bacon, cheese, or fried items. Ask for the dressing on the side, or ask for olive oil and vinegar or lemon to use as dressing.  Estimate how many servings of a food  you are given. For example, a serving of  cooked rice is  cup or about the size of half a baseball. Knowing serving sizes will help you be aware of how much food you are eating at restaurants. The list below tells you how big or small some common portion sizes are based on everyday objects: ? 1 oz--4 stacked dice. ? 3 oz--1 deck of cards. ? 1 tsp--1 die. ? 1 Tbsp-- a ping-pong ball. ? 2 Tbsp--1 ping-pong ball. ?  cup-- baseball. ? 1 cup--1 baseball. Summary  Calorie counting means keeping track of how many calories you eat and drink each day. If you eat fewer calories than your body needs, you should lose weight.  A healthy amount of weight to lose per week is usually 1-2 lb (0.5-0.9 kg). This usually means reducing your daily calorie intake by 500-750 calories.  The number of calories in a food can be found on a Nutrition Facts label. If a food does not have a Nutrition Facts label, try to look up the calories online or ask your dietitian for help.  Use your calories on foods and drinks that will fill you up, and not on foods and drinks that will leave you hungry.  Use smaller plates, glasses, and bowls to prevent overeating. This information is not intended to replace advice given to you by your health care provider. Make sure you discuss any questions you have with your health care provider. Document Revised: 09/18/2017 Document Reviewed: 11/30/2015 Elsevier Patient Education  2020 [employer].  Diabetes Mellitus and Nutrition, Adult When you have diabetes (diabetes mellitus), it is very important to have healthy eating habits because your blood sugar (glucose) levels are greatly affected by what you eat and drink. Eating healthy foods in the appropriate amounts, at about the same times every day, can help you:  Control your blood glucose.  Lower your risk of heart disease.  Improve your blood pressure.  Reach or maintain a healthy weight. Every person with diabetes is different, and each person has different needs  for a meal plan. Your health care provider may recommend that you work with a diet and nutrition specialist (dietitian) to make a meal plan that is best for you. Your meal plan may vary depending on factors such as:  The calories you need.  The medicines you take.  Your weight.  Your blood glucose, blood pressure, and cholesterol levels.  Your activity level.  Other health conditions you have, such as heart or kidney disease. How do carbohydrates affect me? Carbohydrates, also called carbs, affect your blood glucose level more than any other type of food. Eating carbs naturally raises the amount of glucose in your blood. Carb counting is a method for keeping track of how many carbs you eat. Counting carbs is important to keep your blood glucose at a healthy level, especially if you use insulin or take certain oral diabetes medicines. It is important to know how many carbs you can safely have in each meal. This is different for every person. Your dietitian can help you calculate how many carbs you should have at each meal and for each snack. Foods that contain carbs include:  Bread, cereal, rice, pasta, and crackers.  Potatoes and corn.  Peas, beans, and lentils.  Milk and yogurt.  Fruit and juice.  Desserts, such as cakes, cookies, ice cream, and candy. How does alcohol affect me? Alcohol can cause a sudden decrease in blood glucose (hypoglycemia), especially if you use insulin  or take certain oral diabetes medicines. Hypoglycemia can be a life-threatening condition. Symptoms of hypoglycemia (sleepiness, dizziness, and confusion) are similar to symptoms of having too much alcohol. If your health care provider says that alcohol is safe for you, follow these guidelines:  Limit alcohol intake to no more than 1 drink per day for nonpregnant women and 2 drinks per day for men. One drink equals 12 oz of beer, 5 oz of wine, or 1 oz of hard liquor.  Do not drink on an empty  stomach.  Keep yourself hydrated with water, diet soda, or unsweetened iced tea.  Keep in mind that regular soda, juice, and other mixers may contain a lot of sugar and must be counted as carbs. What are tips for following this plan?  Reading food labels  Start by checking the serving size on the "Nutrition Facts" label of packaged foods and drinks. The amount of calories, carbs, fats, and other nutrients listed on the label is based on one serving of the item. Many items contain more than one serving per package.  Check the total grams (g) of carbs in one serving. You can calculate the number of servings of carbs in one serving by dividing the total carbs by 15. For example, if a food has 30 g of total carbs, it would be equal to 2 servings of carbs.  Check the number of grams (g) of saturated and trans fats in one serving. Choose foods that have low or no amount of these fats.  Check the number of milligrams (mg) of salt (sodium) in one serving. Most people should limit total sodium intake to less than 2,300 mg per day.  Always check the nutrition information of foods labeled as "low-fat" or "nonfat". These foods may be higher in added sugar or refined carbs and should be avoided.  Talk to your dietitian to identify your daily goals for nutrients listed on the label. Shopping  Avoid buying canned, premade, or processed foods. These foods tend to be high in fat, sodium, and added sugar.  Shop around the outside edge of the grocery store. This includes fresh fruits and vegetables, bulk grains, fresh meats, and fresh dairy. Cooking  Use low-heat cooking methods, such as baking, instead of high-heat cooking methods like deep frying.  Cook using healthy oils, such as olive, canola, or sunflower oil.  Avoid cooking with butter, cream, or high-fat meats. Meal planning  Eat meals and snacks regularly, preferably at the same times every day. Avoid going long periods of time without  eating.  Eat foods high in fiber, such as fresh fruits, vegetables, beans, and whole grains. Talk to your dietitian about how many servings of carbs you can eat at each meal.  Eat 4-6 ounces (oz) of lean protein each day, such as lean meat, chicken, fish, eggs, or tofu. One oz of lean protein is equal to: ? 1 oz of meat, chicken, or fish. ? 1 egg. ?  cup of tofu.  Eat some foods each day that contain healthy fats, such as avocado, nuts, seeds, and fish. Lifestyle  Check your blood glucose regularly.  Exercise regularly as told by your health care provider. This may include: ? 150 minutes of moderate-intensity or vigorous-intensity exercise each week. This could be brisk walking, biking, or water aerobics. ? Stretching and doing strength exercises, such as yoga or weightlifting, at least 2 times a week.  Take medicines as told by your health care provider.  Do not use any products  that contain nicotine or tobacco, such as cigarettes and e-cigarettes. If you need help quitting, ask your health care provider.  Work with a Veterinary surgeon or diabetes educator to identify strategies to manage stress and any emotional and social challenges. Questions to ask a health care provider  Do I need to meet with a diabetes educator?  Do I need to meet with a dietitian?  What number can I call if I have questions?  When are the best times to check my blood glucose? Where to find more information:  American Diabetes Association: diabetes.org  Academy of Nutrition and Dietetics: www.eatright.[employer] of Diabetes and Digestive and Kidney Diseases (NIH): CarFlippers.tn Summary  A healthy meal plan will help you control your blood glucose and maintain a healthy lifestyle.  Working with a diet and nutrition specialist (dietitian) can help you make a meal plan that is best for you.  Keep in mind that carbohydrates (carbs) and alcohol have immediate effects on your blood glucose  levels. It is important to count carbs and to use alcohol carefully. This information is not intended to replace advice given to you by your health care provider. Make sure you discuss any questions you have with your health care provider. Document Revised: 12/12/2016 Document Reviewed: 02/04/2016 Elsevier Patient Education  2020 Elsevier Inc.      Edwina Barth, MD [facility]

## 2019-07-26 NOTE — Assessment & Plan Note (Signed)
Blood pressure reading elevated in the office.  Normal readings at home.  Advised to continue monitoring blood pressure readings at home and use amlodipine 5 mg daily if readings persistently high.  Do not take medication if readings persistently normal. Uncontrolled diabetes with hemoglobin A1c of 7.4.  Continue Metformin 1000 mg twice a day and start weekly Trulicity at 0.75 mg.  Diet and nutrition discussed. Follow-up in 3 months.

## 2019-07-26 NOTE — Patient Instructions (Addendum)
Continue Metformin 1000 mg twice a day. Start Trulicity 0.75 mg weekly. Monitor blood pressure readings at home.  If persistently high, start amlodipine 5 mg daily.  If normal readings, do not take medication. Follow-up in 3 months.    If you have lab work done today you will be contacted with your lab results within the next 2 weeks.  If you have not heard from [place] then please contact us. The fastest way to get your results is to register for My Chart.   IF you received an x-ray today, you will receive an invoice from [facility]. Please contact [facility] at [phone] with questions or concerns regarding your invoice.   IF you received labwork today, you will receive an invoice from [facility]. Please contact [facility] at [phone] with questions or concerns regarding your invoice.   Our billing staff will not be able to assist you with questions regarding bills from these companies.  You will be contacted with the lab results as soon as they are available. The fastest way to get your results is to activate your My Chart account. Instructions are located on the last page of this paperwork. If you have not heard from [place] regarding the results in 2 weeks, please contact this office.      Calorie Counting for Weight Loss Calories are units of energy. Your body needs a certain amount of calories from food to keep you going throughout the day. When you eat more calories than your body needs, your body stores the extra calories as fat. When you eat fewer calories than your body needs, your body burns fat to get the energy it needs. Calorie counting means keeping track of how many calories you eat and drink each day. Calorie counting can be helpful if you need to lose weight. If you make sure to eat fewer calories than your body needs, you should lose weight. Ask your health care provider what a healthy weight is for you. For calorie counting to work, you will need to eat the  right number of calories in a day in order to lose a healthy amount of weight per week. A dietitian can help you determine how many calories you need in a day and will give you suggestions on how to reach your calorie goal.  A healthy amount of weight to lose per week is usually 1-2 lb (0.5-0.9 kg). This usually means that your daily calorie intake should be reduced by 500-750 calories.  Eating 1,200 - 1,500 calories per day can help most women lose weight.  Eating 1,500 - 1,800 calories per day can help most men lose weight. What is my plan? My goal is to have __________ calories per day. If I have this many calories per day, I should lose around __________ pounds per week. What do I need to know about calorie counting? In order to meet your daily calorie goal, you will need to:  Find out how many calories are in each food you would like to eat. Try to do this before you eat.  Decide how much of the food you plan to eat.  Write down what you ate and how many calories it had. Doing this is called keeping a food log. To successfully lose weight, it is important to balance calorie counting with a healthy lifestyle that includes regular activity. Aim for 150 minutes of moderate exercise (such as walking) or 75 minutes of vigorous exercise (such as running) each week. Where do I find  calorie information?  The number of calories in a food can be found on a Nutrition Facts label. If a food does not have a Nutrition Facts label, try to look up the calories online or ask your dietitian for help. Remember that calories are listed per serving. If you choose to have more than one serving of a food, you will have to multiply the calories per serving by the amount of servings you plan to eat. For example, the label on a package of bread might say that a serving size is 1 slice and that there are 90 calories in a serving. If you eat 1 slice, you will have eaten 90 calories. If you eat 2 slices, you will have  eaten 180 calories. How do I keep a food log? Immediately after each meal, record the following information in your food log:  What you ate. Don't forget to include toppings, sauces, and other extras on the food.  How much you ate. This can be measured in cups, ounces, or number of items.  How many calories each food and drink had.  The total number of calories in the meal. Keep your food log near you, such as in a small notebook in your pocket, or use a mobile app or website. Some programs will calculate calories for you and show you how many calories you have left for the day to meet your goal. What are some calorie counting tips?   Use your calories on foods and drinks that will fill you up and not leave you hungry: ? Some examples of foods that fill you up are nuts and nut butters, vegetables, lean proteins, and high-fiber foods like whole grains. High-fiber foods are foods with more than 5 g fiber per serving. ? Drinks such as sodas, specialty coffee drinks, alcohol, and juices have a lot of calories, yet do not fill you up.  Eat nutritious foods and avoid empty calories. Empty calories are calories you get from foods or beverages that do not have many vitamins or protein, such as candy, sweets, and soda. It is better to have a nutritious high-calorie food (such as an avocado) than a food with few nutrients (such as a bag of chips).  Know how many calories are in the foods you eat most often. This will help you calculate calorie counts faster.  Pay attention to calories in drinks. Low-calorie drinks include water and unsweetened drinks.  Pay attention to nutrition labels for "low fat" or "fat free" foods. These foods sometimes have the same amount of calories or more calories than the full fat versions. They also often have added sugar, starch, or salt, to make up for flavor that was removed with the fat.  Find a way of tracking calories that works for you. Get creative. Try different  apps or programs if writing down calories does not work for you. What are some portion control tips?  Know how many calories are in a serving. This will help you know how many servings of a certain food you can have.  Use a measuring cup to measure serving sizes. You could also try weighing out portions on a kitchen scale. With time, you will be able to estimate serving sizes for some foods.  Take some time to put servings of different foods on your favorite plates, bowls, and cups so you know what a serving looks like.  Try not to eat straight from a bag or box. Doing this can lead to overeating. Put  the amount you would like to eat in a cup or on a plate to make sure you are eating the right portion.  Use smaller plates, glasses, and bowls to prevent overeating.  Try not to multitask (for example, watch TV or use your computer) while eating. If it is time to eat, sit down at a table and enjoy your food. This will help you to know when you are full. It will also help you to be aware of what you are eating and how much you are eating. What are tips for following this plan? Reading food labels  Check the calorie count compared to the serving size. The serving size may be smaller than what you are used to eating.  Check the source of the calories. Make sure the food you are eating is high in vitamins and protein and low in saturated and trans fats. Shopping  Read nutrition labels while you shop. This will help you make healthy decisions before you decide to purchase your food.  Make a grocery list and stick to it. Cooking  Try to cook your favorite foods in a healthier way. For example, try baking instead of frying.  Use low-fat dairy products. Meal planning  Use more fruits and vegetables. Half of your plate should be fruits and vegetables.  Include lean proteins like poultry and fish. How do I count calories when eating out?  Ask for smaller portion sizes.  Consider sharing an  entree and sides instead of getting your own entree.  If you get your own entree, eat only half. Ask for a box at the beginning of your meal and put the rest of your entree in it so you are not tempted to eat it.  If calories are listed on the menu, choose the lower calorie options.  Choose dishes that include vegetables, fruits, whole grains, low-fat dairy products, and lean protein.  Choose items that are boiled, broiled, grilled, or steamed. Stay away from items that are buttered, battered, fried, or served with cream sauce. Items labeled "crispy" are usually fried, unless stated otherwise.  Choose water, low-fat milk, unsweetened iced tea, or other drinks without added sugar. If you want an alcoholic beverage, choose a lower calorie option such as a glass of wine or light beer.  Ask for dressings, sauces, and syrups on the side. These are usually high in calories, so you should limit the amount you eat.  If you want a salad, choose a garden salad and ask for grilled meats. Avoid extra toppings like bacon, cheese, or fried items. Ask for the dressing on the side, or ask for olive oil and vinegar or lemon to use as dressing.  Estimate how many servings of a food you are given. For example, a serving of cooked rice is  cup or about the size of half a baseball. Knowing serving sizes will help you be aware of how much food you are eating at restaurants. The list below tells you how big or small some common portion sizes are based on everyday objects: ? 1 oz--4 stacked dice. ? 3 oz--1 deck of cards. ? 1 tsp--1 die. ? 1 Tbsp-- a ping-pong ball. ? 2 Tbsp--1 ping-pong ball. ?  cup-- baseball. ? 1 cup--1 baseball. Summary  Calorie counting means keeping track of how many calories you eat and drink each day. If you eat fewer calories than your body needs, you should lose weight.  A healthy amount of weight to lose per week is usually  1-2 lb (0.5-0.9 kg). This usually means reducing your  daily calorie intake by 500-750 calories.  The number of calories in a food can be found on a Nutrition Facts label. If a food does not have a Nutrition Facts label, try to look up the calories online or ask your dietitian for help.  Use your calories on foods and drinks that will fill you up, and not on foods and drinks that will leave you hungry.  Use smaller plates, glasses, and bowls to prevent overeating. This information is not intended to replace advice given to you by your health care provider. Make sure you discuss any questions you have with your health care provider. Document Revised: 09/18/2017 Document Reviewed: 11/30/2015 Elsevier Patient Education  2020 [employer].  Diabetes Mellitus and Nutrition, Adult When you have diabetes (diabetes mellitus), it is very important to have healthy eating habits because your blood sugar (glucose) levels are greatly affected by what you eat and drink. Eating healthy foods in the appropriate amounts, at about the same times every day, can help you:  Control your blood glucose.  Lower your risk of heart disease.  Improve your blood pressure.  Reach or maintain a healthy weight. Every person with diabetes is different, and each person has different needs for a meal plan. Your health care provider may recommend that you work with a diet and nutrition specialist (dietitian) to make a meal plan that is best for you. Your meal plan may vary depending on factors such as:  The calories you need.  The medicines you take.  Your weight.  Your blood glucose, blood pressure, and cholesterol levels.  Your activity level.  Other health conditions you have, such as heart or kidney disease. How do carbohydrates affect me? Carbohydrates, also called carbs, affect your blood glucose level more than any other type of food. Eating carbs naturally raises the amount of glucose in your blood. Carb counting is a method for keeping track of how many carbs  you eat. Counting carbs is important to keep your blood glucose at a healthy level, especially if you use insulin or take certain oral diabetes medicines. It is important to know how many carbs you can safely have in each meal. This is different for every person. Your dietitian can help you calculate how many carbs you should have at each meal and for each snack. Foods that contain carbs include:  Bread, cereal, rice, pasta, and crackers.  Potatoes and corn.  Peas, beans, and lentils.  Milk and yogurt.  Fruit and juice.  Desserts, such as cakes, cookies, ice cream, and candy. How does alcohol affect me? Alcohol can cause a sudden decrease in blood glucose (hypoglycemia), especially if you use insulin or take certain oral diabetes medicines. Hypoglycemia can be a life-threatening condition. Symptoms of hypoglycemia (sleepiness, dizziness, and confusion) are similar to symptoms of having too much alcohol. If your health care provider says that alcohol is safe for you, follow these guidelines:  Limit alcohol intake to no more than 1 drink per day for nonpregnant women and 2 drinks per day for men. One drink equals 12 oz of beer, 5 oz of wine, or 1 oz of hard liquor.  Do not drink on an empty stomach.  Keep yourself hydrated with water, diet soda, or unsweetened iced tea.  Keep in mind that regular soda, juice, and other mixers may contain a lot of sugar and must be counted as carbs. What are tips for following this plan?  Reading food labels  Start by checking the serving size on the "Nutrition Facts" label of packaged foods and drinks. The amount of calories, carbs, fats, and other nutrients listed on the label is based on one serving of the item. Many items contain more than one serving per package.  Check the total grams (g) of carbs in one serving. You can calculate the number of servings of carbs in one serving by dividing the total carbs by 15. For example, if a food has 30 g of  total carbs, it would be equal to 2 servings of carbs.  Check the number of grams (g) of saturated and trans fats in one serving. Choose foods that have low or no amount of these fats.  Check the number of milligrams (mg) of salt (sodium) in one serving. Most people should limit total sodium intake to less than 2,300 mg per day.  Always check the nutrition information of foods labeled as "low-fat" or "nonfat". These foods may be higher in added sugar or refined carbs and should be avoided.  Talk to your dietitian to identify your daily goals for nutrients listed on the label. Shopping  Avoid buying canned, premade, or processed foods. These foods tend to be high in fat, sodium, and added sugar.  Shop around the outside edge of the grocery store. This includes fresh fruits and vegetables, bulk grains, fresh meats, and fresh dairy. Cooking  Use low-heat cooking methods, such as baking, instead of high-heat cooking methods like deep frying.  Cook using healthy oils, such as olive, canola, or sunflower oil.  Avoid cooking with butter, cream, or high-fat meats. Meal planning  Eat meals and snacks regularly, preferably at the same times every day. Avoid going long periods of time without eating.  Eat foods high in fiber, such as fresh fruits, vegetables, beans, and whole grains. Talk to your dietitian about how many servings of carbs you can eat at each meal.  Eat 4-6 ounces (oz) of lean protein each day, such as lean meat, chicken, fish, eggs, or tofu. One oz of lean protein is equal to: ? 1 oz of meat, chicken, or fish. ? 1 egg. ?  cup of tofu.  Eat some foods each day that contain healthy fats, such as avocado, nuts, seeds, and fish. Lifestyle  Check your blood glucose regularly.  Exercise regularly as told by your health care provider. This may include: ? 150 minutes of moderate-intensity or vigorous-intensity exercise each week. This could be brisk walking, biking, or water  aerobics. ? Stretching and doing strength exercises, such as yoga or weightlifting, at least 2 times a week.  Take medicines as told by your health care provider.  Do not use any products that contain nicotine or tobacco, such as cigarettes and e-cigarettes. If you need help quitting, ask your health care provider.  Work with a Veterinary surgeoncounselor or diabetes educator to identify strategies to manage stress and any emotional and social challenges. Questions to ask a health care provider  Do I need to meet with a diabetes educator?  Do I need to meet with a dietitian?  What number can I call if I have questions?  When are the best times to check my blood glucose? Where to find more information:  American Diabetes Association: diabetes.org  Academy of Nutrition and Dietetics: www.eatright.[employer]  National Institute of Diabetes and Digestive and Kidney Diseases (NIH): CarFlippers.tnwww.niddk.nih.gov Summary  A healthy meal plan will help you control your blood glucose and maintain a healthy  lifestyle.  Working with a diet and nutrition specialist (dietitian) can help you make a meal plan that is best for you.  Keep in mind that carbohydrates (carbs) and alcohol have immediate effects on your blood glucose levels. It is important to count carbs and to use alcohol carefully. This information is not intended to replace advice given to you by your health care provider. Make sure you discuss any questions you have with your health care provider. Document Revised: 12/12/2016 Document Reviewed: 02/04/2016 Elsevier Patient Education  2020 [employer].

## 2019-07-27 LAB — COMPREHENSIVE METABOLIC PANEL
ALT: 17 IU/L (ref 0–32)
AST: 12 IU/L (ref 0–40)
Albumin/Globulin Ratio: 1.1 — ABNORMAL LOW (ref 1.2–2.2)
Albumin: 4.1 g/dL (ref 3.8–4.8)
Alkaline Phosphatase: 71 IU/L (ref 48–121)
BUN/Creatinine Ratio: 14 (ref 9–23)
BUN: 8 mg/dL (ref 6–20)
Bilirubin Total: 0.2 mg/dL (ref 0.0–1.2)
CO2: 23 mmol/L (ref 20–29)
Calcium: 9.6 mg/dL (ref 8.7–10.2)
Chloride: 99 mmol/L (ref 96–106)
Creatinine, Ser: 0.56 mg/dL — ABNORMAL LOW (ref 0.57–1.00)
GFR calc Af Amer: 140 mL/min/{1.73_m2} (ref >59)
GFR calc non Af Amer: 121 mL/min/{1.73_m2} (ref >59)
Globulin, Total: 3.7 g/dL (ref 1.5–4.5)
Glucose: 116 mg/dL — ABNORMAL HIGH (ref 65–99)
Potassium: 4.4 mmol/L (ref 3.5–5.2)
Sodium: 134 mmol/L (ref 134–144)
Total Protein: 7.8 g/dL (ref 6.0–8.5)

## 2019-07-27 LAB — MICROALBUMIN, URINE: Microalbumin, Urine: 9.4 ug/mL

## 2019-07-27 LAB — LIPID PANEL
Chol/HDL Ratio: 2.9 ratio (ref 0.0–4.4)
Cholesterol, Total: 140 mg/dL (ref 100–199)
HDL: 49 mg/dL (ref >39)
LDL Chol Calc (NIH): 79 mg/dL (ref 0–99)
Triglycerides: 59 mg/dL (ref 0–149)
VLDL Cholesterol Cal: 12 mg/dL (ref 5–40)

## 2019-07-27 LAB — HEPATITIS C ANTIBODY: Hep C Virus Ab: <0.1 s/co ratio (ref 0.0–0.9)

## 2019-08-03 ENCOUNTER — Ambulatory Visit (INDEPENDENT_AMBULATORY_CARE_PROVIDER_SITE_OTHER): Payer: BC Managed Care – PPO | Admitting: Podiatry

## 2019-08-03 ENCOUNTER — Other Ambulatory Visit: Payer: Self-pay

## 2019-08-03 ENCOUNTER — Encounter: Payer: Self-pay | Admitting: Podiatry

## 2019-08-03 DIAGNOSIS — L603 Nail dystrophy: Secondary | ICD-10-CM

## 2019-08-03 DIAGNOSIS — L600 Ingrowing nail: Secondary | ICD-10-CM

## 2019-08-03 DIAGNOSIS — L601 Onycholysis: Secondary | ICD-10-CM | POA: Diagnosis not present

## 2019-08-03 DIAGNOSIS — S9032XA Contusion of left foot, initial encounter: Secondary | ICD-10-CM

## 2019-08-03 DIAGNOSIS — B351 Tinea unguium: Secondary | ICD-10-CM | POA: Diagnosis not present

## 2019-08-03 MED ORDER — NEOMYCIN-POLYMYXIN-HC 1 % OT SOLN: OTIC | 1 refills | Status: DC

## 2019-08-03 NOTE — Patient Instructions (Signed)

## 2019-08-03 NOTE — Progress Notes (Signed)
Subjective:  Patient ID: Pamela Duarte, female    DOB: 1985-10-21,  MRN: [id number] HPI Chief Complaint  Patient presents with   Toe Pain    Hallux left - medial border, tender, red, swollen x 3 weeks, lateral border lifted from nailbed, tried vinegar soaks, patient is 4 months postpartum and no longer breastfeeding   New Patient (Initial Visit)    34 y.o. female presents with the above complaint.   ROS: Denies fever chills nausea vomiting muscle aches pains calf pain back pain chest pain shortness of breath.  Past Medical History:  Diagnosis Date   Diabetes mellitus without complication (HCC)    insulin type 2   PCOS (polycystic ovarian syndrome)    Past Surgical History:  Procedure Laterality Date   ABDOMINOPLASTY  2015   CESAREAN SECTION N/A 03/30/2019   Procedure: CESAREAN SECTION;  Surgeon: Huel Cote, MD;  Location: [facility];  Service: Obstetrics;  Laterality: N/A;   COSMETIC SURGERY      Current Outpatient Medications:    amLODipine (NORVASC) 5 MG tablet, Take 1 tablet (5 mg total) by mouth daily., Disp: 90 tablet, Rfl: 3   Continuous Blood Gluc Receiver (FREESTYLE LIBRE 14 DAY READER) DEVI, 1 application by Does not apply route daily. E11.9, Disp: 1 Device, Rfl: 0   Continuous Blood Gluc Sensor (FREESTYLE LIBRE 14 DAY SENSOR) MISC, 1 application by Does not apply route daily., Disp: 6 each, Rfl: 4   Dulaglutide (TRULICITY) 0.75 MG/0.5ML SOPN, Inject 0.5 mLs (0.75 mg total) into the skin once a week., Disp: 4 pen, Rfl: 5   glucose blood (FREESTYLE TEST STRIPS) test strip, Use as instructed Dx code: E11.9-Dispense Freestyle Libre test strips, Disp: 100 each, Rfl: 12   insulin lispro (HUMALOG) 100 UNIT/ML KwikPen, , Disp: , Rfl:    JENCYCLA 0.35 MG tablet, Take 1 tablet by mouth daily., Disp: , Rfl:    metFORMIN (GLUCOPHAGE-XR) 500 MG 24 hr tablet, Take 1,000 mg by mouth 2 (two) times daily., Disp: , Rfl:    metoCLOPramide (REGLAN) 10 MG  tablet, Take 10 mg by mouth 3 (three) times daily., Disp: , Rfl:    NEOMYCIN-POLYMYXIN-HYDROCORTISONE (CORTISPORIN) 1 % SOLN OTIC solution, Apply 1-2 drops to toe BID after soaking, Disp: 10 mL, Rfl: 1   oxyCODONE (OXY IR/ROXICODONE) 5 MG immediate release tablet, Take 1-2 tablets (5-10 mg total) by mouth every 4 (four) hours as needed for severe pain., Disp: 15 tablet, Rfl: 0   Prenatal Vit-Fe Fumarate-FA (PRENATAL MULTIVITAMIN) TABS tablet, Take 1 tablet by mouth daily at 12 noon., Disp: , Rfl:   Allergies  Allergen Reactions   Belviq [Lorcaserin Hcl] Nausea And Vomiting   Review of Systems Objective:  There were no vitals filed for this visit.  General: Well developed, nourished, in no acute distress, alert and oriented x3   Dermatological: Skin is warm, dry and supple bilateral. Nails x 10 are well maintained; remaining integument appears unremarkable at this time. There are no open sores, no preulcerative lesions, no rash or signs of infection present.  Vascular: Dorsalis Pedis artery and Posterior Tibial artery pedal pulses are 2/4 bilateral with immedate capillary fill time. Pedal hair growth present. No varicosities and no lower extremity edema present bilateral.   Neruologic: Grossly intact via light touch bilateral. Vibratory intact via tuning fork bilateral. Protective threshold with Semmes Wienstein monofilament intact to all pedal sites bilateral. Patellar and Achilles deep tendon reflexes 2+ bilateral. No Babinski or clonus noted bilateral.   Musculoskeletal: No  gross boney pedal deformities bilateral. No pain, crepitus, or limitation noted with foot and ankle range of motion bilateral. Muscular strength 5/5 in all groups tested bilateral.  Gait: Unassisted, Nonantalgic.    Radiographs:  None taken  Assessment & Plan:   Assessment: Ingrown toenail tibial border distal onycholysis lateral border hallux nail right discoloration painful.  Plan: Total nail avulsion  today will allow this to grow back.  This was performed after local anesthetic was administered.  She tolerated procedure well.  She is left with both oral and written home-going instructions for the care and soaking of her foot as well as a prescription for Corticosporin otic to be applied daily.  The nail plate was sent for pathologic evaluation.     Dezhane Stat[place] T. [place], [place]

## 2019-08-24 ENCOUNTER — Ambulatory Visit (INDEPENDENT_AMBULATORY_CARE_PROVIDER_SITE_OTHER): Payer: BC Managed Care – PPO | Admitting: Podiatry

## 2019-08-24 ENCOUNTER — Other Ambulatory Visit: Payer: Self-pay

## 2019-08-24 DIAGNOSIS — L603 Nail dystrophy: Secondary | ICD-10-CM | POA: Diagnosis not present

## 2019-08-24 MED ORDER — TERBINAFINE HCL 250 MG PO TABS: 250.0000 mg | ORAL_TABLET | Freq: Every day | ORAL | 0 refills | Status: DC

## 2019-08-24 NOTE — Progress Notes (Signed)
She presents today for follow-up of her nail avulsion hallux left.  Also we sent the nail off for pathology.  She states that she is doing quite well she is happy with the way that is healing.  Objective: Vital signs are stable she is alert and oriented x3 there is no erythema edema cellulitis drainage or odor the nail bed appears to be healing very nicely.  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  Assessment: Well-healing surgical toe with a saprophytic fungus.  Plan: Discussed etiology pathology conservative surgical therapies at this point time we will start her on terbinafine oral therapy with recent labs reading normal.  250 mg tablets 1 p.o. daily and we also recommend topical antifungal cream to help boost the efficacy.  Obviously lasers not an option at this point no nail plate.  Follow-up with me in 1 month

## 2019-08-30 ENCOUNTER — Encounter: Payer: BC Managed Care – PPO | Admitting: Emergency Medicine

## 2019-08-30 DIAGNOSIS — E119 Type 2 diabetes mellitus without complications: Secondary | ICD-10-CM | POA: Diagnosis not present

## 2019-08-30 LAB — HM DIABETES EYE EXAM

## 2019-09-05 NOTE — Progress Notes (Signed)
Cardiology Office Note:   Date:  09/07/2019  NAME:  Pamela Duarte    MRN: [id number] DOB:  1985/11/17   PCP:  Georgina Quint, MD  Cardiologist:  No primary care provider on file.   Referring MD: Georgina Quint, *  Chief Complaint  Patient presents with  . Tachycardia    History of Present Illness:   Pamela Duarte is a 34 y.o. female with a hx of diabetes, hypertension, obesity who is being seen today for the evaluation of tachycardia at the request of Sagardia, Eilleen Kempf, MD.  Was evaluated by her primary care physician after noted to have arrhythmia on apple watch.  She reports she recently had a delivery.  She had a baby girl.  She had a C-section.  That pregnancy was complicated by hypertension.  She also has pre-existing diabetes with an A1c of 7.4.  She reports several weeks after delivery she noticed her heart was beating fast.  She does have an apple watch.  I did review the strips in office which show her heart rate can go up into the 140s.  On my review it appears to be an ectopic atrial rhythm.  Does not appear to be A. fib.  It is a little bit difficult to delineate but the apple watch did read his A. fib.  She apparently gets episodes once every 3 weeks.  She reports that she has no identifiable triggers.  She is cut back on caffeine.  Has not helped.  She reports that her symptoms can last several minutes and then resolve without intervention.  Overall it is bothersome but does not cause her major symptoms.  I did inform her that we need to confirm with a Zio patch.  She is okay to do this.  Most recent thyroid studies show a normal TSH.  She reports she has no chest pain or trouble breathing.  She does not exercise routinely.  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  She mainly works nights.  There is no concern for sleep apnea.  She is never had a heart attack or stroke.  Family history significant for  hypertension and diabetes.  She apparently had diabetes for few years.  Her most recent LDL cholesterol is 79.  Her EKG today demonstrates normal sinus rhythm.  She has no murmurs rubs or gallops on exam.  Problem list 1.  Diabetes -A1c 7.4 -Total cholesterol 140, HDL 49, LDL 79, triglycerides 59 2.  Hypertension 3.  Obesity  Past Medical History: Past Medical History:  Diagnosis Date  . 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 PCOS (polycystic ovarian syndrome)     Past Surgical History: Past Surgical History:  Procedure Laterality Date  . ABDOMINOPLASTY  2015  . CESAREAN SECTION N/A 03/30/2019   Procedure: CESAREAN SECTION;  Surgeon: Huel Cote, MD;  Location: [facility];  Service: Obstetrics;  Laterality: N/A;  . COSMETIC SURGERY      Current Medications: Current Meds  Medication Sig  . amLODipine (NORVASC) 5 MG tablet Take 1 tablet (5 mg total) by mouth daily.  . Continuous Blood Gluc Receiver (FREESTYLE LIBRE 14 DAY READER) DEVI 1 application by Does not apply route daily. E11.9  . Continuous Blood Gluc Sensor (FREESTYLE LIBRE 14 DAY SENSOR) MISC 1 application by Does not apply route daily.  . Dulaglutide (TRULICITY) 0.75 MG/0.5ML SOPN Inject 0.75 mg into the skin once a  week. Every Wednesday  . glucose blood (FREESTYLE TEST STRIPS) test strip Use as instructed Dx code: E11.9-Dispense Freestyle Libre test strips  . JENCYCLA 0.35 MG tablet Take 1 tablet by mouth daily.  . metFORMIN (GLUCOPHAGE-XR) 500 MG 24 hr tablet Take 1,000 mg by mouth 2 (two) times daily.  Marland Kitchen oxyCODONE (OXY IR/ROXICODONE) 5 MG immediate release tablet Take 1-2 tablets (5-10 mg total) by mouth every 4 (four) hours as needed for severe pain.  . Prenatal Vit-Fe Fumarate-FA (PRENATAL MULTIVITAMIN) TABS tablet Take 1 tablet by mouth daily at 12 noon.  . terbinafine (LAMISIL) 250 MG tablet Take 1 tablet (250 mg total) by mouth daily.     Allergies:    Patient has no active  allergies.   Social History: Social History   Socioeconomic History  . Marital status: Married    Spouse name: Not on file  . Number of children: 1  . Years of education: Not on file  . Highest education level: Not on file  Occupational History  . Not on file  Tobacco Use  . Smoking status: Never Smoker  . Smokeless tobacco: Never Used  Vaping Use  . Vaping Use: Never used  Substance and Sexual Activity  . Alcohol use: No  . Drug use: No  . Sexual activity: Not Currently  Other Topics Concern  . Not on file  Social History Narrative  . Not on file   Social Determinants of Health   Financial Resource Strain:   . Difficulty of Paying Living Expenses: Not on file  Food Insecurity:   . Worried About Programme researcher, broadcasting/film/video in the Last Year: Not on file  . Ran Out of Food in the Last Year: Not on file  Transportation Needs:   . Lack of Transportation (Medical): Not on file  . Lack of Transportation (Non-Medical): Not on file  Physical Activity:   . Days of Exercise per Week: Not on file  . Minutes of Exercise per Session: Not on file  Stress:   . Feeling of Stress : Not on file  Social Connections:   . Frequency of Communication with Friends and Family: Not on file  . Frequency of Social Gatherings with Friends and Family: Not on file  . Attends Religious Services: Not on file  . Active Member of Clubs or Organizations: Not on file  . Attends Banker Meetings: Not on file  . Marital Status: Not on file     Family History: The patient's family history includes Cancer in her paternal grandfather; Diabetes in her maternal grandmother, paternal grandfather, and paternal grandmother; Hypertension in her maternal grandmother, mother, paternal grandfather, and paternal grandmother.  ROS:   All other ROS reviewed and negative. Pertinent positives noted in the HPI.     EKGs/Labs/Other Studies Reviewed:   The following studies were personally reviewed by me  today:  EKG:  EKG is ordered today.  The ekg ordered today demonstrates normal sinus rhythm, heart rate 96, no acute ST-T changes, no evidence of prior infarction, and was personally reviewed by me.   Recent Labs: 04/12/2019: Hemoglobin 9.6; Platelets 666 07/26/2019: ALT 17; BUN 8; Creatinine, Ser 0.56; Potassium 4.4; Sodium 134   Recent Lipid Panel    Component Value Date/Time   CHOL 140 07/26/2019 1119   TRIG 59 07/26/2019 1119   HDL 49 07/26/2019 1119   CHOLHDL 2.9 07/26/2019 1119   CHOLHDL 2.4 04/12/2014 1104   VLDL 10 04/12/2014 1104   LDLCALC 79 07/26/2019 1119  Physical Exam:   VS:  BP 118/84   Pulse 96   Ht 5\' 8"  (1.727 m)   Wt 257 lb (116.6 kg)   SpO2 98%   BMI 39.08 kg/m    Wt Readings from Last 3 Encounters:  09/07/19 257 lb (116.6 kg)  07/26/19 258 lb 9.6 oz (117.3 kg)  04/12/19 260 lb (117.9 kg)    General: Well nourished, well developed, in no acute distress Heart: Atraumatic, normal size  Eyes: PEERLA, EOMI  Neck: Supple, no JVD Endocrine: No thryomegaly Cardiac: Normal S1, S2; RRR; no murmurs, rubs, or gallops Lungs: Clear to auscultation bilaterally, no wheezing, rhonchi or rales  Abd: Soft, nontender, no hepatomegaly  Ext: No edema, pulses 2+ Musculoskeletal: No deformities, BUE and BLE strength normal and equal Skin: Warm and dry, no rashes   Neuro: Alert and oriented to person, place, time, and situation, CNII-XII grossly intact, no focal deficits  Psych: Normal mood and affect   ASSESSMENT:   Pamela Duarte is a 34 y.o. female who presents for the following: 1. Atrial tachycardia (HCC)   2. Essential hypertension   3. Obesity, morbid, BMI 40.0-49.9 (HCC)     PLAN:   1. Essential hypertension 2. Obesity, morbid, BMI 40.0-49.9 (HCC) 3. Atrial tachycardia (HCC) -Palpitations every 3 weeks.  Review of Apple Watch monitor demonstrates likely ectopic atrial tachycardia.  It was read as A. fib but I clearly see P waves.  Recent thyroid  studies normal.  Recent delivery of a baby.  Apparently that pregnancy was uncomplicated.  Main CVD risk is include diabetes and hypertension.  Blood pressure is well controlled.  There is no concern for sleep apnea. -I have recommended a 2-week Zio patch to clarify what is going on.  Hopefully we will catch an arrhythmia during that time.  I also recommended to start metoprolol succinate 25 mg daily. -She also needs an echocardiogram.  We need to make sure heart is structurally normal.  Her EKG is normal today.    Disposition: Return in about 3 months (around 12/08/2019).  Medication Adjustments/Labs and Tests Ordered: Current medicines are reviewed at length with the patient today.  Concerns regarding medicines are outlined above.  Orders Placed This Encounter  Procedures  . LONG TERM MONITOR (3-14 DAYS)  . ECHOCARDIOGRAM COMPLETE   Meds ordered this encounter  Medications  . metoprolol succinate (TOPROL XL) 25 MG 24 hr tablet    Sig: Take 1 tablet (25 mg total) by mouth daily.    Dispense:  30 tablet    Refill:  3    Patient Instructions  Medication Instructions:  START metoprolol succinate (Toprol XL) 25 mg daily  *If you need a refill on your cardiac medications before your next appointment, please call your pharmacy*  Testing/Procedures: Your physician has requested that you have an echocardiogram. Echocardiography is a painless test that uses sound waves to create images of your heart. It provides your doctor with information about the size and shape of your heart and how well your heart's chambers and valves are working. This procedure takes approximately one hour. There are no restrictions for this procedure. This will be done at our [facility] location:  [address]   ZIO XT- Long Term Monitor Instructions   Your physician has requested you wear your ZIO patch monitor 14 days.   This is a single patch monitor.  [employer] supplies one patch monitor per  enrollment.  Additional stickers are not available.  Please do not apply patch if you will be having a Nuclear Stress Test, Echocardiogram, Cardiac CT, MRI, or Chest Xray during the time frame you would be wearing the monitor. The patch cannot be worn during these tests.  You cannot remove and re-apply the ZIO XT patch monitor.   Your ZIO patch monitor will be sent USPS Priority mail from [facility] directly to your home address. The monitor may also be mailed to a PO BOX if home delivery is not available.   It may take 3-5 days to receive your monitor after you have been enrolled.   Once you have received you monitor, please review enclosed instructions.  Your monitor has already been registered assigning a specific monitor serial # to you.   Applying the monitor   Shave hair from upper left chest.   Hold abrader disc by orange tab.  Rub abrader in 40 strokes over left upper chest as indicated in your monitor instructions.   Clean area with 4 enclosed alcohol pads .  Use all pads to assure are is cleaned thoroughly.  Let dry.   Apply patch as indicated in monitor instructions.  Patch will be place under collarbone on left side of chest with arrow pointing upward.   Rub patch adhesive wings for 2 minutes.Remove white label marked "1".  Remove white label marked "2".  Rub patch adhesive wings for 2 additional minutes.   While looking in a mirror, press and release button in center of patch.  A small green light will flash 3-4 times .  This will be your only indicator the monitor has been turned on.     Do not shower for the first 24 hours.  You may shower after the first 24 hours.   Press button if you feel a symptom. You will hear a small click.  Record Date, Time and Symptom in the Patient Log Book.   When you are ready to remove patch, follow instructions on last 2 pages of Patient Log Book.  Stick patch monitor onto last page of Patient Log Book.   Place Patient Log Book in  [place] box.  Use locking tab on box and tape box closed securely.  The Orange and [employer] has [employer] on it.  Please place in mailbox as soon as possible.  Your physician should have your test results approximately 7 days after the monitor has been mailed back to [facility].   Call [facility] Customer Care at [phone] if you have questions regarding your ZIO XT patch monitor.  Call them immediately if you see an orange light blinking on your monitor.   If your monitor falls off in less than 4 days contact our Monitor department at [phone].  If your monitor becomes loose or falls off after 4 days call [employer] at [phone] for suggestions on securing your monitor.   Follow-Up: At [facility], you and your health needs are our priority.  As part of our continuing mission to provide you with exceptional heart care, we have created designated Provider Care Teams.  These Care Teams include your primary Cardiologist (physician) and Advanced Practice Providers (APPs -  Physician Assistants and Nurse Practitioners) who all work together to provide you with the care you need, when you need it.  We recommend signing up for the patient portal called "MyChart".  Sign up information is provided on this After Visit Summary.  MyChart is used to connect with patients for Virtual Visits (Telemedicine).  Patients are  able to view lab/test results, encounter notes, upcoming appointments, etc.  Non-urgent messages can be sent to your provider as well.   To learn more about what you can do with MyChart, go to [email or web address].    Your next appointment:   3 month(s)  The format for your next appointment:   In Person  Provider:   Lennie Odor, MD       Signed, Lenna Gilford. Flora Lipps, MD [facility]  [address] [place], [place] [address] [phone]  09/07/2019 10:21 AM

## 2019-09-06 ENCOUNTER — Encounter: Payer: Self-pay | Admitting: *Deleted

## 2019-09-07 ENCOUNTER — Other Ambulatory Visit: Payer: Self-pay

## 2019-09-07 ENCOUNTER — Telehealth: Payer: Self-pay | Admitting: Radiology

## 2019-09-07 ENCOUNTER — Ambulatory Visit (INDEPENDENT_AMBULATORY_CARE_PROVIDER_SITE_OTHER): Payer: BC Managed Care – PPO | Admitting: Cardiovascular Disease

## 2019-09-07 ENCOUNTER — Encounter: Payer: Self-pay | Admitting: Cardiovascular Disease

## 2019-09-07 VITALS — BP 118/84 | HR 96 | Ht 68.0 in | Wt 257.0 lb

## 2019-09-07 DIAGNOSIS — I10 Essential (primary) hypertension: Secondary | ICD-10-CM

## 2019-09-07 DIAGNOSIS — I471 Supraventricular tachycardia: Secondary | ICD-10-CM | POA: Diagnosis not present

## 2019-09-07 MED ORDER — METOPROLOL SUCCINATE ER 25 MG PO TB24: 25.0000 mg | ORAL_TABLET | Freq: Every day | ORAL | 3 refills | Status: DC

## 2019-09-07 NOTE — Telephone Encounter (Signed)
Enrolled patient for a 14 day Zio XT  monitor to be mailed to patients home  °

## 2019-09-07 NOTE — Patient Instructions (Signed)
Medication Instructions:  START metoprolol succinate (Toprol XL) 25 mg daily  *If you need a refill on your cardiac medications before your next appointment, please call your pharmacy*  Testing/Procedures: Your physician has requested that you have an echocardiogram. Echocardiography is a painless test that uses sound waves to create images of your heart. It provides your doctor with information about the size and shape of your heart and how well your heart's chambers and valves are working. This procedure takes approximately one hour. There are no restrictions for this procedure. This will be done at our [facility] location:  [address]   ZIO XT- Long Term Monitor Instructions   Your physician has requested you wear your ZIO patch monitor 14 days.   This is a single patch monitor.  Ir[employer] supplies one patch monitor per enrollment.  Additional stickers are not available.   Please do not apply patch if you will be having a Nuclear Stress Test, Echocardiogram, Cardiac CT, MRI, or Chest Xray during the time frame you would be wearing the monitor. The patch cannot be worn during these tests.  You cannot remove and re-apply the ZIO XT patch monitor.   Your ZIO patch monitor will be sent USPS Priority mail from [facility] directly to your home address. The monitor may also be mailed to a PO BOX if home delivery is not available.   It may take 3-5 days to receive your monitor after you have been enrolled.   Once you have received you monitor, please review enclosed instructions.  Your monitor has already been registered assigning a specific monitor serial # to you.   Applying the monitor   Shave hair from upper left chest.   Hold abrader disc by orange tab.  Rub abrader in 40 strokes over left upper chest as indicated in your monitor instructions.   Clean area with 4 enclosed alcohol pads .  Use all pads to assure are is cleaned thoroughly.  Let dry.   Apply  patch as indicated in monitor instructions.  Patch will be place under collarbone on left side of chest with arrow pointing upward.   Rub patch adhesive wings for 2 minutes.Remove white label marked "1".  Remove white label marked "2".  Rub patch adhesive wings for 2 additional minutes.   While looking in a mirror, press and release button in center of patch.  A small green light will flash 3-4 times .  This will be your only indicator the monitor has been turned on.     Do not shower for the first 24 hours.  You may shower after the first 24 hours.   Press button if you feel a symptom. You will hear a small click.  Record Date, Time and Symptom in the Patient Log Book.   When you are ready to remove patch, follow instructions on last 2 pages of Patient Log Book.  Stick patch monitor onto last page of Patient Log Book.   Place Patient Log Book in [place] box.  Use locking tab on box and tape box closed securely.  The Orange and [employer] has [employer] on it.  Please place in mailbox as soon as possible.  Your physician should have your test results approximately 7 days after the monitor has been mailed back to [facility].   Call [facility] Customer Care at [phone] if you have questions regarding your ZIO XT patch monitor.  Call them immediately if you see an orange  light blinking on your monitor.   If your monitor falls off in less than 4 days contact our Monitor department at [phone].  If your monitor becomes loose or falls off after 4 days call [employer] at [phone] for suggestions on securing your monitor.   Follow-Up: At [facility], you and your health needs are our priority.  As part of our continuing mission to provide you with exceptional heart care, we have created designated Provider Care Teams.  These Care Teams include your primary Cardiologist (physician) and Advanced Practice Providers (APPs -  Physician Assistants and Nurse Practitioners) who all work  together to provide you with the care you need, when you need it.  We recommend signing up for the patient portal called "MyChart".  Sign up information is provided on this After Visit Summary.  MyChart is used to connect with patients for Virtual Visits (Telemedicine).  Patients are able to view lab/test results, encounter notes, upcoming appointments, etc.  Non-urgent messages can be sent to your provider as well.   To learn more about what you can do with MyChart, go to [email or web address].    Your next appointment:   3 month(s)  The format for your next appointment:   In Person  Provider:   Lennie Odor, MD

## 2019-09-13 NOTE — Addendum Note (Signed)
Addended by: Johney Frame A on: 09/13/2019 10:28 AM   Modules accepted: Orders

## 2019-09-15 ENCOUNTER — Encounter: Payer: Self-pay | Admitting: *Deleted

## 2019-09-23 ENCOUNTER — Encounter: Payer: Self-pay | Admitting: Emergency Medicine

## 2019-09-23 MED ORDER — FREESTYLE LIBRE 14 DAY SENSOR MISC: 1.0000 "application " | Freq: Every day | 4 refills | Status: DC

## 2019-09-26 ENCOUNTER — Telehealth (HOSPITAL_COMMUNITY): Payer: Self-pay | Admitting: Cardiovascular Disease

## 2019-09-26 NOTE — Telephone Encounter (Signed)
We had to reschedule echocardiogram scheduled for 09/27/2019 per Pamela Duarte.  The procedure is still pending PA# from [employer]. I left a VM to call office to get new appointment date and time and not to come to appointment on 09/27/2019 and I rescheduled til 10/13/2019.

## 2019-09-27 ENCOUNTER — Other Ambulatory Visit (HOSPITAL_COMMUNITY): Payer: BC Managed Care – PPO

## 2019-09-28 ENCOUNTER — Encounter: Payer: BC Managed Care – PPO | Admitting: Podiatry

## 2019-10-12 ENCOUNTER — Encounter: Payer: BC Managed Care – PPO | Admitting: Podiatry

## 2019-10-12 ENCOUNTER — Ambulatory Visit: Payer: BC Managed Care – PPO | Admitting: Emergency Medicine

## 2019-10-13 ENCOUNTER — Other Ambulatory Visit (HOSPITAL_COMMUNITY): Payer: BC Managed Care – PPO

## 2019-10-14 ENCOUNTER — Other Ambulatory Visit: Payer: Self-pay

## 2019-10-14 ENCOUNTER — Ambulatory Visit (HOSPITAL_COMMUNITY): Payer: BC Managed Care – PPO | Attending: Cardiology

## 2019-10-14 DIAGNOSIS — I471 Supraventricular tachycardia: Secondary | ICD-10-CM | POA: Insufficient documentation

## 2019-10-14 LAB — ECHOCARDIOGRAM COMPLETE
Area-P 1/2: 3.26 cm2
S' Lateral: 2.2 cm

## 2019-10-19 ENCOUNTER — Other Ambulatory Visit: Payer: Self-pay

## 2019-10-19 ENCOUNTER — Encounter: Payer: Self-pay | Admitting: Emergency Medicine

## 2019-10-19 ENCOUNTER — Ambulatory Visit (INDEPENDENT_AMBULATORY_CARE_PROVIDER_SITE_OTHER): Payer: BC Managed Care – PPO | Admitting: Emergency Medicine

## 2019-10-19 VITALS — BP 118/80 | HR 84 | Temp 98.3°F | Resp 16 | Ht 67.5 in | Wt 261.0 lb

## 2019-10-19 DIAGNOSIS — Z23 Encounter for immunization: Secondary | ICD-10-CM | POA: Diagnosis not present

## 2019-10-19 DIAGNOSIS — I4719 Other supraventricular tachycardia: Secondary | ICD-10-CM | POA: Insufficient documentation

## 2019-10-19 DIAGNOSIS — Z111 Encounter for screening for respiratory tuberculosis: Secondary | ICD-10-CM

## 2019-10-19 DIAGNOSIS — E118 Type 2 diabetes mellitus with unspecified complications: Secondary | ICD-10-CM | POA: Diagnosis not present

## 2019-10-19 DIAGNOSIS — E1159 Type 2 diabetes mellitus with other circulatory complications: Secondary | ICD-10-CM

## 2019-10-19 DIAGNOSIS — Z6841 Body Mass Index (BMI) 40.0 and over, adult: Secondary | ICD-10-CM | POA: Diagnosis not present

## 2019-10-19 DIAGNOSIS — I152 Hypertension secondary to endocrine disorders: Secondary | ICD-10-CM

## 2019-10-19 DIAGNOSIS — I471 Supraventricular tachycardia: Secondary | ICD-10-CM

## 2019-10-19 LAB — POCT GLYCOSYLATED HEMOGLOBIN (HGB A1C): Hemoglobin A1C: 6.5 % — AB (ref 4.0–5.6)

## 2019-10-19 LAB — GLUCOSE, POCT (MANUAL RESULT ENTRY): POC Glucose: 127 mg/dl — AB (ref 70–99)

## 2019-10-19 MED ORDER — METFORMIN HCL ER 500 MG PO TB24: 1000.0000 mg | ORAL_TABLET | Freq: Two times a day (BID) | ORAL | 3 refills | Status: DC

## 2019-10-19 MED ORDER — TRULICITY 0.75 MG/0.5ML ~~LOC~~ SOAJ: 0.7500 mg | SUBCUTANEOUS | 5 refills | Status: DC

## 2019-10-19 NOTE — Progress Notes (Signed)
Pamela Duarte 34 y.o.   Chief Complaint  Patient presents with  . Diabetes    3 months follow up  . Medication Refill    Tulicity ([facility]), Prenatal Vitamin and Metformin    ASSESSMENT & PLAN: Hypertension associated with diabetes (HCC) Blood pressure reading elevated in the office.  Normal readings at home.  Advised to continue monitoring blood pressure readings at home and use amlodipine 5 mg daily if readings persistently high.  Do not take medication if readings persistently normal. Uncontrolled diabetes with hemoglobin A1c of 7.4.  Continue Metformin 1000 mg twice a day and start weekly Trulicity at 0.75 mg.  Diet and nutrition discussed. Follow-up in 3 months. HISTORY OF PRESENT ILLNESS: This is a 34 y.o. female with history of hypertension and diabetes here for follow-up. Has no complaints or medical concerns today blood pressure has been under control with amlodipine 5 mg and was recently started on metoprolol succinate 25 mg daily.  Normal blood pressure readings at home.  Cardiology visit on 09/07/2019 as follows: ASSESSMENT:   Pamela Duarte is a 34 y.o. female who presents for the following: 1. Atrial tachycardia (HCC)   2. Essential hypertension   3. Obesity, morbid, BMI 40.0-49.9 (HCC)     PLAN:   1. Essential hypertension 2. Obesity, morbid, BMI 40.0-49.9 (HCC) 3. Atrial tachycardia (HCC) -Palpitations every 3 weeks.  Review of Apple Watch monitor demonstrates likely ectopic atrial tachycardia.  It was read as A. fib but I clearly see P waves.  Recent thyroid studies normal.  Recent delivery of a baby.  Apparently that pregnancy was uncomplicated.  Main CVD risk is include diabetes and hypertension.  Blood pressure is well controlled.  There is no concern for sleep apnea. -I have recommended a 2-week Zio patch to clarify what is going on.  Hopefully we will catch an arrhythmia during that time.  I also recommended to start metoprolol  succinate 25 mg daily. -She also needs an echocardiogram.  We need to make sure heart is structurally normal.  Her EKG is normal today.    Disposition: Return in about 3 months (around 12/08/2019).  Echocardiogram done on 10/14/2019:Conclusion(s)/Recommendation(s): Normal biventricular function without  evidence of hemodynamically significant valvular heart disease.   HPI   Prior to Admission medications   Medication Sig Start Date End Date Taking? Authorizing Provider  amLODipine (NORVASC) 5 MG tablet Take 1 tablet (5 mg total) by mouth daily. 07/26/19  Yes Wallace Cogliano, Eilleen Kempf, MD  Dulaglutide (TRULICITY) 0.75 MG/0.5ML SOPN Inject 0.75 mg into the skin once a week. Every Wednesday   Yes [provider]  glucose blood (FREESTYLE TEST STRIPS) test strip Use as instructed Dx code: E11.9-Dispense Freestyle Libre test strips 07/26/17  Yes Stallings, Zoe A, MD  metFORMIN (GLUCOPHAGE-XR) 500 MG 24 hr tablet Take 1,000 mg by mouth 2 (two) times daily. 06/22/19  Yes [provider]  metoprolol succinate (TOPROL XL) 25 MG 24 hr tablet Take 1 tablet (25 mg total) by mouth daily. 09/07/19  Yes O'Neal, Ronnald Ramp, MD  oxyCODONE (OXY IR/ROXICODONE) 5 MG immediate release tablet Take 1-2 tablets (5-10 mg total) by mouth every 4 (four) hours as needed for severe pain. 04/01/19  Yes Meisinger, Todd, MD  Prenatal Vit-Fe Fumarate-FA (PRENATAL MULTIVITAMIN) TABS tablet Take 1 tablet by mouth daily at 12 noon.   Yes [provider]  Continuous Blood Gluc Receiver (FREESTYLE LIBRE 14 DAY READER) DEVI 1 application by Does not apply route daily. E11.9 06/25/17  Doristine Bosworth, MD  Continuous Blood Gluc Sensor (FREESTYLE LIBRE 14 DAY SENSOR) MISC 1 application by Does not apply route daily. 09/23/19   Georgina Quint, MD  JENCYCLA 0.35 MG tablet Take 1 tablet by mouth daily. 07/18/19   [provider]  terbinafine (LAMISIL) 250 MG tablet Take 1 tablet (250 mg total) by mouth  daily. 08/24/19   Hyatt, Max T, DPM    No Known Allergies  Patient Active Problem List   Diagnosis Date Noted  . Hypertension associated with diabetes (HCC) 07/26/2019  . Preeclampsia in postpartum period 04/12/2019  . Status post primary low transverse cesarean section 03/30/2019  . Normal labor and delivery 03/29/2019  . Newly diagnosed diabetes (HCC) 06/26/2017  . Infertility counseling 06/26/2017  . Class 2 obesity due to excess calories without serious comorbidity with body mass index (BMI) of 39.0 to 39.9 in adult 06/26/2017    Past Medical History:  Diagnosis Date  . 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 PCOS (polycystic ovarian syndrome)     Past Surgical History:  Procedure Laterality Date  . ABDOMINOPLASTY  2015  . CESAREAN SECTION N/A 03/30/2019   Procedure: CESAREAN SECTION;  Surgeon: Huel Cote, MD;  Location: [facility]S;  Service: Obstetrics;  Laterality: N/A;  . COSMETIC SURGERY      Social History   Socioeconomic History  . Marital status: Married    Spouse name: Not on file  . Number of children: 1  . Years of education: Not on file  . Highest education level: Not on file  Occupational History  . Not on file  Tobacco Use  . Smoking status: Never Smoker  . Smokeless tobacco: Never Used  Vaping Use  . Vaping Use: Never used  Substance and Sexual Activity  . Alcohol use: No  . Drug use: No  . Sexual activity: Not Currently  Other Topics Concern  . Not on file  Social History Narrative  . Not on file   Social Determinants of Health   Financial Resource Strain:   . Difficulty of Paying Living Expenses: Not on file  Food Insecurity:   . Worried About Programme researcher, broadcasting/film/video in the Last Year: Not on file  . Ran Out of Food in the Last Year: Not on file  Transportation Needs:   . Lack of Transportation (Medical): Not on file  . Lack of Transportation (Non-Medical): Not on file  Physical Activity:   . Days of  Exercise per Week: Not on file  . Minutes of Exercise per Session: Not on file  Stress:   . Feeling of Stress : Not on file  Social Connections:   . Frequency of Communication with Friends and Family: Not on file  . Frequency of Social Gatherings with Friends and Family: Not on file  . Attends Religious Services: Not on file  . Active Member of Clubs or Organizations: Not on file  . Attends Banker Meetings: Not on file  . Marital Status: Not on file  Intimate Partner Violence:   . Fear of Current or Ex-Partner: Not on file  . Emotionally Abused: Not on file  . Physically Abused: Not on file  . Sexually Abused: Not on file    Family History  Problem Relation Age of Onset  . Diabetes Maternal Grandmother   . Hypertension Maternal Grandmother   . Diabetes Paternal Grandfather   . Hypertension Paternal Grandfather   . Cancer Paternal Grandfather   .  Hypertension Paternal Grandmother   . Diabetes Paternal Grandmother   . Hypertension Mother      Review of Systems  Constitutional: Negative.  Negative for chills and fever.  HENT: Negative.  Negative for congestion and sore throat.   Respiratory: Negative.  Negative for cough and shortness of breath.   Cardiovascular: Positive for palpitations. Negative for chest pain.  Gastrointestinal: Negative.  Negative for abdominal pain, diarrhea, nausea and vomiting.  Genitourinary: Negative.  Negative for dysuria and hematuria.  Musculoskeletal: Negative.  Negative for joint pain and myalgias.  Skin: Negative.  Negative for rash.  Neurological: Negative.  Negative for dizziness and headaches.  All other systems reviewed and are negative.   Today's Vitals   10/19/19 1014  BP: 118/80  Pulse: 84  Resp: 16  Temp: 98.3 F (36.8 C)  TempSrc: Temporal  SpO2: 98%  Weight: 261 lb (118.4 kg)  Height: 5' 7.5" (1.715 m)   Body mass index is 40.28 kg/m. Wt Readings from Last 3 Encounters:  10/19/19 261 lb (118.4 kg)    09/07/19 257 lb (116.6 kg)  07/26/19 258 lb 9.6 oz (117.3 kg)    Physical Exam Vitals reviewed.  Constitutional:      Appearance: Normal appearance. She is obese.  HENT:     Head: Normocephalic.  Eyes:     Extraocular Movements: Extraocular movements intact.     Pupils: Pupils are equal, round, and reactive to light.  Cardiovascular:     Rate and Rhythm: Normal rate and regular rhythm.     Pulses: Normal pulses.     Heart sounds: Normal heart sounds.  Pulmonary:     Effort: Pulmonary effort is normal.     Breath sounds: Normal breath sounds.  Musculoskeletal:        General: Normal range of motion.     Cervical back: Normal range of motion and neck supple.  Skin:    General: Skin is warm and dry.     Capillary Refill: Capillary refill takes less than 2 seconds.  Neurological:     General: No focal deficit present.     Mental Status: She is alert and oriented to person, place, and time.  Psychiatric:        Mood and Affect: Mood normal.        Behavior: Behavior normal.    Results for orders placed or performed in visit on 10/19/19 (from the past 24 hour(s))  POCT glucose (manual entry)     Status: Abnormal   Collection Time: 10/19/19 10:41 AM  Result Value Ref Range   POC Glucose 127 (A) 70 - 99 mg/dl  POCT glycosylated hemoglobin (Hb A1C)     Status: Abnormal   Collection Time: 10/19/19 10:45 AM  Result Value Ref Range   Hemoglobin A1C 6.5 (A) 4.0 - 5.6 %   HbA1c POC (<> result, manual entry)     HbA1c, POC (prediabetic range)     HbA1c, POC (controlled diabetic range)       ASSESSMENT & PLAN: Hypertension associated with diabetes (HCC) Well-controlled hypertension.  Continue amlodipine and metoprolol succinate. Well-controlled diabetes with hemoglobin A1c at 6.5.  Continue Metformin and Trulicity. Diet and nutrition discussed. Follow-up in 6 months.  Pamela Duarte was seen today for diabetes and medication refill.  Diagnoses and all orders for this  visit:  Hypertension associated with diabetes (HCC)  Type 2 diabetes mellitus with complication, without long-term current use of insulin (HCC) -     POCT glucose (manual entry) -  POCT glycosylated hemoglobin (Hb A1C) -     Dulaglutide (TRULICITY) 0.75 MG/0.5ML SOPN; Inject 0.75 mg into the skin once a week. Every Wednesday -     metFORMIN (GLUCOPHAGE-XR) 500 MG 24 hr tablet; Take 2 tablets (1,000 mg total) by mouth 2 (two) times daily.  Need for prophylactic vaccination and inoculation against influenza -     Flu Vaccine QUAD 36+ mos IM  Body mass index (BMI) of 40.1-44.9 in adult [facility])  Atrial tachycardia (HCC)  Tuberculosis screening -     Cancel: QuantiFERON-TB Gold Plus -     QuantiFERON-TB Gold Plus    Patient Instructions       If you have lab work done today you will be contacted with your lab results within the next 2 weeks.  If you have not heard from [place] then please contact us. The fastest way to get your results is to register for My Chart.   IF you received an x-ray today, you will receive an invoice from [facility]. Please contact [facility] at [phone] with questions or concerns regarding your invoice.   IF you received labwork today, you will receive an invoice from [facility]. Please contact [facility] at [phone] with questions or concerns regarding your invoice.   Our billing staff will not be able to assist you with questions regarding bills from these companies.  You will be contacted with the lab results as soon as they are available. The fastest way to get your results is to activate your My Chart account. Instructions are located on the last page of this paperwork. If you have not heard from [place] regarding the results in 2 weeks, please contact this office.     Diabetes Mellitus and Nutrition, Adult When you have diabetes (diabetes mellitus), it is very important to have healthy eating habits because your blood sugar  (glucose) levels are greatly affected by what you eat and drink. Eating healthy foods in the appropriate amounts, at about the same times every day, can help you:  Control your blood glucose.  Lower your risk of heart disease.  Improve your blood pressure.  Reach or maintain a healthy weight. Every person with diabetes is different, and each person has different needs for a meal plan. Your health care provider may recommend that you work with a diet and nutrition specialist (dietitian) to make a meal plan that is best for you. Your meal plan may vary depending on factors such as:  The calories you need.  The medicines you take.  Your weight.  Your blood glucose, blood pressure, and cholesterol levels.  Your activity level.  Other health conditions you have, such as heart or kidney disease. How do carbohydrates affect me? Carbohydrates, also called carbs, affect your blood glucose level more than any other type of food. Eating carbs naturally raises the amount of glucose in your blood. Carb counting is a method for keeping track of how many carbs you eat. Counting carbs is important to keep your blood glucose at a healthy level, especially if you use insulin or take certain oral diabetes medicines. It is important to know how many carbs you can safely have in each meal. This is different for every person. Your dietitian can help you calculate how many carbs you should have at each meal and for each snack. Foods that contain carbs include:  Bread, cereal, rice, pasta, and crackers.  Potatoes and corn.  Peas, beans, and lentils.  Milk and yogurt.  Fruit and juice.  Desserts, such as cakes, cookies, ice cream, and candy. How does alcohol affect me? Alcohol can cause a sudden decrease in blood glucose (hypoglycemia), especially if you use insulin or take certain oral diabetes medicines. Hypoglycemia can be a life-threatening condition. Symptoms of hypoglycemia (sleepiness,  dizziness, and confusion) are similar to symptoms of having too much alcohol. If your health care provider says that alcohol is safe for you, follow these guidelines:  Limit alcohol intake to no more than 1 drink per day for nonpregnant women and 2 drinks per day for men. One drink equals 12 oz of beer, 5 oz of wine, or 1 oz of hard liquor.  Do not drink on an empty stomach.  Keep yourself hydrated with water, diet soda, or unsweetened iced tea.  Keep in mind that regular soda, juice, and other mixers may contain a lot of sugar and must be counted as carbs. What are tips for following this plan?  Reading food labels  Start by checking the serving size on the "Nutrition Facts" label of packaged foods and drinks. The amount of calories, carbs, fats, and other nutrients listed on the label is based on one serving of the item. Many items contain more than one serving per package.  Check the total grams (g) of carbs in one serving. You can calculate the number of servings of carbs in one serving by dividing the total carbs by 15. For example, if a food has 30 g of total carbs, it would be equal to 2 servings of carbs.  Check the number of grams (g) of saturated and trans fats in one serving. Choose foods that have low or no amount of these fats.  Check the number of milligrams (mg) of salt (sodium) in one serving. Most people should limit total sodium intake to less than 2,300 mg per day.  Always check the nutrition information of foods labeled as "low-fat" or "nonfat". These foods may be higher in added sugar or refined carbs and should be avoided.  Talk to your dietitian to identify your daily goals for nutrients listed on the label. Shopping  Avoid buying canned, premade, or processed foods. These foods tend to be high in fat, sodium, and added sugar.  Shop around the outside edge of the grocery store. This includes fresh fruits and vegetables, bulk grains, fresh meats, and fresh  dairy. Cooking  Use low-heat cooking methods, such as baking, instead of high-heat cooking methods like deep frying.  Cook using healthy oils, such as olive, canola, or sunflower oil.  Avoid cooking with butter, cream, or high-fat meats. Meal planning  Eat meals and snacks regularly, preferably at the same times every day. Avoid going long periods of time without eating.  Eat foods high in fiber, such as fresh fruits, vegetables, beans, and whole grains. Talk to your dietitian about how many servings of carbs you can eat at each meal.  Eat 4-6 ounces (oz) of lean protein each day, such as lean meat, chicken, fish, eggs, or tofu. One oz of lean protein is equal to: ? 1 oz of meat, chicken, or fish. ? 1 egg. ?  cup of tofu.  Eat some foods each day that contain healthy fats, such as avocado, nuts, seeds, and fish. Lifestyle  Check your blood glucose regularly.  Exercise regularly as told by your health care provider. This may include: ? 150 minutes of moderate-intensity or vigorous-intensity exercise each week. This could be brisk walking, biking, or water aerobics. ? Stretching and doing  strength exercises, such as yoga or weightlifting, at least 2 times a week.  Take medicines as told by your health care provider.  Do not use any products that contain nicotine or tobacco, such as cigarettes and e-cigarettes. If you need help quitting, ask your health care provider.  Work with a Veterinary surgeon or diabetes educator to identify strategies to manage stress and any emotional and social challenges. Questions to ask a health care provider  Do I need to meet with a diabetes educator?  Do I need to meet with a dietitian?  What number can I call if I have questions?  When are the best times to check my blood glucose? Where to find more information:  American Diabetes Association: diabetes.org  Academy of Nutrition and Dietetics: www.eatright.[employer] of Diabetes and  Digestive and Kidney Diseases (NIH): CarFlippers.tn Summary  A healthy meal plan will help you control your blood glucose and maintain a healthy lifestyle.  Working with a diet and nutrition specialist (dietitian) can help you make a meal plan that is best for you.  Keep in mind that carbohydrates (carbs) and alcohol have immediate effects on your blood glucose levels. It is important to count carbs and to use alcohol carefully. This information is not intended to replace advice given to you by your health care provider. Make sure you discuss any questions you have with your health care provider. Document Revised: 12/12/2016 Document Reviewed: 02/04/2016 Elsevier Patient Education  2020 Elsevier Inc.      Edwina Barth, MD [facility]

## 2019-10-19 NOTE — Assessment & Plan Note (Signed)
Well-controlled hypertension.  Continue amlodipine and metoprolol succinate. Well-controlled diabetes with hemoglobin A1c at 6.5.  Continue Metformin and Trulicity. Diet and nutrition discussed. Follow-up in 6 months.

## 2019-10-19 NOTE — Patient Instructions (Addendum)
   If you have lab work done today you will be contacted with your lab results within the next 2 weeks.  If you have not heard from us then please contact us. The fastest way to get your results is to register for My Chart.   IF you received an x-ray today, you will receive an invoice from [facility]. Please contact [facility] at [phone] with questions or concerns regarding your invoice.   IF you received labwork today, you will receive an invoice from [facility]. Please contact [facility] at [phone] with questions or concerns regarding your invoice.   Our billing staff will not be able to assist you with questions regarding bills from these companies.  You will be contacted with the lab results as soon as they are available. The fastest way to get your results is to activate your My Chart account. Instructions are located on the last page of this paperwork. If you have not heard from us regarding the results in 2 weeks, please contact this office.      Diabetes Mellitus and Nutrition, Adult When you have diabetes (diabetes mellitus), it is very important to have healthy eating habits because your blood sugar (glucose) levels are greatly affected by what you eat and drink. Eating healthy foods in the appropriate amounts, at about the same times every day, can help you:  Control your blood glucose.  Lower your risk of heart disease.  Improve your blood pressure.  Reach or maintain a healthy weight. Every person with diabetes is different, and each person has different needs for a meal plan. Your health care provider may recommend that you work with a diet and nutrition specialist (dietitian) to make a meal plan that is best for you. Your meal plan may vary depending on factors such as:  The calories you need.  The medicines you take.  Your weight.  Your blood glucose, blood pressure, and cholesterol levels.  Your activity level.  Other health  conditions you have, such as heart or kidney disease. How do carbohydrates affect me? Carbohydrates, also called carbs, affect your blood glucose level more than any other type of food. Eating carbs naturally raises the amount of glucose in your blood. Carb counting is a method for keeping track of how many carbs you eat. Counting carbs is important to keep your blood glucose at a healthy level, especially if you use insulin or take certain oral diabetes medicines. It is important to know how many carbs you can safely have in each meal. This is different for every person. Your dietitian can help you calculate how many carbs you should have at each meal and for each snack. Foods that contain carbs include:  Bread, cereal, rice, pasta, and crackers.  Potatoes and corn.  Peas, beans, and lentils.  Milk and yogurt.  Fruit and juice.  Desserts, such as cakes, cookies, ice cream, and candy. How does alcohol affect me? Alcohol can cause a sudden decrease in blood glucose (hypoglycemia), especially if you use insulin or take certain oral diabetes medicines. Hypoglycemia can be a life-threatening condition. Symptoms of hypoglycemia (sleepiness, dizziness, and confusion) are similar to symptoms of having too much alcohol. If your health care provider says that alcohol is safe for you, follow these guidelines:  Limit alcohol intake to no more than 1 drink per day for nonpregnant women and 2 drinks per day for men. One drink equals 12 oz of beer, 5 oz of wine, or 1 oz of hard   liquor.  Do not drink on an empty stomach.  Keep yourself hydrated with water, diet soda, or unsweetened iced tea.  Keep in mind that regular soda, juice, and other mixers may contain a lot of sugar and must be counted as carbs. What are tips for following this plan?  Reading food labels  Start by checking the serving size on the "Nutrition Facts" label of packaged foods and drinks. The amount of calories, carbs, fats, and  other nutrients listed on the label is based on one serving of the item. Many items contain more than one serving per package.  Check the total grams (g) of carbs in one serving. You can calculate the number of servings of carbs in one serving by dividing the total carbs by 15. For example, if a food has 30 g of total carbs, it would be equal to 2 servings of carbs.  Check the number of grams (g) of saturated and trans fats in one serving. Choose foods that have low or no amount of these fats.  Check the number of milligrams (mg) of salt (sodium) in one serving. Most people should limit total sodium intake to less than 2,300 mg per day.  Always check the nutrition information of foods labeled as "low-fat" or "nonfat". These foods may be higher in added sugar or refined carbs and should be avoided.  Talk to your dietitian to identify your daily goals for nutrients listed on the label. Shopping  Avoid buying canned, premade, or processed foods. These foods tend to be high in fat, sodium, and added sugar.  Shop around the outside edge of the grocery store. This includes fresh fruits and vegetables, bulk grains, fresh meats, and fresh dairy. Cooking  Use low-heat cooking methods, such as baking, instead of high-heat cooking methods like deep frying.  Cook using healthy oils, such as olive, canola, or sunflower oil.  Avoid cooking with butter, cream, or high-fat meats. Meal planning  Eat meals and snacks regularly, preferably at the same times every day. Avoid going long periods of time without eating.  Eat foods high in fiber, such as fresh fruits, vegetables, beans, and whole grains. Talk to your dietitian about how many servings of carbs you can eat at each meal.  Eat 4-6 ounces (oz) of lean protein each day, such as lean meat, chicken, fish, eggs, or tofu. One oz of lean protein is equal to: ? 1 oz of meat, chicken, or fish. ? 1 egg. ?  cup of tofu.  Eat some foods each day that  contain healthy fats, such as avocado, nuts, seeds, and fish. Lifestyle  Check your blood glucose regularly.  Exercise regularly as told by your health care provider. This may include: ? 150 minutes of moderate-intensity or vigorous-intensity exercise each week. This could be brisk walking, biking, or water aerobics. ? Stretching and doing strength exercises, such as yoga or weightlifting, at least 2 times a week.  Take medicines as told by your health care provider.  Do not use any products that contain nicotine or tobacco, such as cigarettes and e-cigarettes. If you need help quitting, ask your health care provider.  Work with a counselor or diabetes educator to identify strategies to manage stress and any emotional and social challenges. Questions to ask a health care provider  Do I need to meet with a diabetes educator?  Do I need to meet with a dietitian?  What number can I call if I have questions?  When are the best   times to check my blood glucose? Where to find more information:  American Diabetes Association: diabetes.org  Academy of Nutrition and Dietetics: www.eatright.org  National Institute of Diabetes and Digestive and Kidney Diseases (NIH): www.niddk.nih.gov Summary  A healthy meal plan will help you control your blood glucose and maintain a healthy lifestyle.  Working with a diet and nutrition specialist (dietitian) can help you make a meal plan that is best for you.  Keep in mind that carbohydrates (carbs) and alcohol have immediate effects on your blood glucose levels. It is important to count carbs and to use alcohol carefully. This information is not intended to replace advice given to you by your health care provider. Make sure you discuss any questions you have with your health care provider. Document Revised: 12/12/2016 Document Reviewed: 02/04/2016 Elsevier Patient Education  2020 Elsevier Inc.  

## 2019-10-21 LAB — QUANTIFERON-TB GOLD PLUS
QuantiFERON Mitogen Value: >10 IU/mL
QuantiFERON Nil Value: 0.09 IU/mL
QuantiFERON TB1 Ag Value: 0.08 IU/mL
QuantiFERON TB2 Ag Value: 0.12 IU/mL
QuantiFERON-TB Gold Plus: NEGATIVE

## 2019-11-07 ENCOUNTER — Telehealth: Payer: Self-pay | Admitting: Emergency Medicine

## 2019-11-07 NOTE — Telephone Encounter (Signed)
Called pt LVM to call us back to schedule virtual appt for Covid testing  Per CRM

## 2019-11-08 ENCOUNTER — Encounter: Payer: Self-pay | Admitting: Emergency Medicine

## 2019-11-28 ENCOUNTER — Telehealth: Payer: Self-pay

## 2019-11-28 NOTE — Telephone Encounter (Signed)
Letter ready for pick up at front office. Pt is requesting tb and flu documentation for work

## 2019-12-14 ENCOUNTER — Encounter: Payer: Self-pay | Admitting: Emergency Medicine

## 2019-12-15 ENCOUNTER — Telehealth: Payer: Self-pay

## 2019-12-15 NOTE — Telephone Encounter (Signed)
Pt physical form has been completed. Pt has been called. Form located at front office. Copy of the form is located at nurses station.

## 2020-01-03 ENCOUNTER — Ambulatory Visit (INDEPENDENT_AMBULATORY_CARE_PROVIDER_SITE_OTHER): Payer: BC Managed Care – PPO | Admitting: Emergency Medicine

## 2020-01-03 ENCOUNTER — Other Ambulatory Visit: Payer: Self-pay

## 2020-01-03 ENCOUNTER — Encounter: Payer: Self-pay | Admitting: Emergency Medicine

## 2020-01-03 VITALS — BP 123/80 | Temp 90.0°F | Resp 16 | Ht 67.5 in | Wt 259.0 lb

## 2020-01-03 DIAGNOSIS — L918 Other hypertrophic disorders of the skin: Secondary | ICD-10-CM

## 2020-01-03 NOTE — Progress Notes (Signed)
Pamela Duarte 34 y.o.   Chief Complaint  Patient presents with  . Skin Tag    Per patient on the left arm for 5 years and bothering her    HISTORY OF PRESENT ILLNESS: This is Duarte 34 y.o. female here for skin tag removal. No other complaints or medical concerns today.  HPI   Prior to Admission medications   Medication Sig Start Date End Date Taking? Authorizing Provider  amLODipine (NORVASC) 5 MG tablet Take 1 tablet (5 mg total) by mouth daily. 07/26/19  Yes Pamela Duarte, Pamela Kempf, MD  Dulaglutide (TRULICITY) 0.75 MG/0.5ML SOPN Inject 0.75 mg into the skin once Duarte week. Every Wednesday 10/19/19  Yes Pamela Duarte, Pamela Kempf, MD  folic acid (FOLVITE) 800 MCG tablet Take 400 mcg by mouth daily.   Yes [provider]  metFORMIN (GLUCOPHAGE-XR) 500 MG 24 hr tablet Take 2 tablets (1,000 mg total) by mouth 2 (two) times daily. 10/19/19  Yes Pamela Duarte, Pamela Kempf, MD  oxyCODONE (OXY IR/ROXICODONE) 5 MG immediate release tablet Take 1-2 tablets (5-10 mg total) by mouth every 4 (four) hours as needed for severe pain. 04/01/19  Yes Duarte, Todd, MD  Prenatal Vit-Fe Fumarate-FA (PRENATAL MULTIVITAMIN) TABS tablet Take 1 tablet by mouth daily at 12 noon.   Yes [provider]  Continuous Blood Gluc Sensor (FREESTYLE LIBRE 14 DAY SENSOR) MISC 1 application by Does not apply route daily. 09/23/19   Pamela Quint, MD  glucose blood (FREESTYLE TEST STRIPS) test strip Use as instructed Dx code: E11.9-Dispense Freestyle Libre test strips 07/26/17   Pamela Siad A, MD  metoprolol succinate (TOPROL XL) 25 MG 24 hr tablet Take 1 tablet (25 mg total) by mouth daily. Patient not taking: Reported on 01/03/2020 09/07/19   Pamela Rives, MD    No Known Allergies  Patient Active Problem List   Diagnosis Date Noted  . Atrial tachycardia (HCC) 10/19/2019  . Hypertension associated with diabetes (HCC) 07/26/2019  . Preeclampsia in postpartum period 04/12/2019  . Status post  primary low transverse cesarean section 03/30/2019  . Normal labor and delivery 03/29/2019  . Newly diagnosed diabetes (HCC) 06/26/2017  . Infertility counseling 06/26/2017  . Class 2 obesity due to excess calories without serious comorbidity with body mass index (BMI) of 39.0 to 39.9 in adult 06/26/2017    Past Medical History:  Diagnosis Date  . 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 PCOS (polycystic ovarian syndrome)     Past Surgical History:  Procedure Laterality Date  . ABDOMINOPLASTY  2015  . CESAREAN SECTION N/Duarte 03/30/2019   Procedure: CESAREAN SECTION;  Surgeon: Huel Cote, MD;  Location: [facility] LD [facility]S;  Service: Obstetrics;  Laterality: N/Duarte;  . COSMETIC SURGERY      Social History   Socioeconomic History  . Marital status: Married    Spouse name: Not on file  . Number of children: 1  . Years of education: Not on file  . Highest education level: Not on file  Occupational History  . Not on file  Tobacco Use  . Smoking status: Never Smoker  . Smokeless tobacco: Never Used  Vaping Use  . Vaping Use: Never used  Substance and Sexual Activity  . Alcohol use: No  . Drug use: No  . Sexual activity: Not Currently  Other Topics Concern  . Not on file  Social History Narrative  . Not on file   Social Determinants of Health   Financial Resource Strain:  Not on file  Food Insecurity: Not on file  Transportation Needs: Not on file  Physical Activity: Not on file  Stress: Not on file  Social Connections: Not on file  Intimate Partner Violence: Not on file    Family History  Problem Relation Age of Onset  . Diabetes Maternal Grandmother   . Hypertension Maternal Grandmother   . Diabetes Paternal Grandfather   . Hypertension Paternal Grandfather   . Cancer Paternal Grandfather   . Hypertension Paternal Grandmother   . Diabetes Paternal Grandmother   . Hypertension Mother      Review of Systems  Constitutional: Negative for  chills and fever.  HENT: Negative for congestion and sore throat.   Respiratory: Negative for cough and shortness of breath.   Gastrointestinal: Negative for nausea and vomiting.  Neurological: Negative for dizziness and headaches.  All other systems reviewed and are negative.    Physical Exam Vitals reviewed.  Constitutional:      Appearance: Normal appearance. She is obese.  HENT:     Head: Normocephalic.  Cardiovascular:     Rate and Rhythm: Normal rate.  Pulmonary:     Effort: Pulmonary effort is normal.  Skin:    General: Skin is warm and dry.     Comments: Positive skin tag left upper arm inner area  Neurological:     General: No focal deficit present.     Mental Status: She is alert and oriented to person, place, and time.  Psychiatric:        Mood and Affect: Mood normal.        Behavior: Behavior normal.      ASSESSMENT & PLAN: [name] was seen today for skin tag.  Diagnoses and all orders for this visit:  Skin tag -     Ambulatory referral to Dermatology    Patient Instructions       If you have lab work done today you will be contacted with your lab results within the next 2 weeks.  If you have not heard from [place] then please contact us. The fastest way to get your results is to register for My Chart.   IF you received an x-ray today, you will receive an invoice from [facility]. Please contact [facility] at [phone] with questions or concerns regarding your invoice.   IF you received labwork today, you will receive an invoice from [facility]. Please contact [facility] at [phone] with questions or concerns regarding your invoice.   Our billing staff will not be able to assist you with questions regarding bills from these companies.  You will be contacted with the lab results as soon as they are available. The fastest way to get your results is to activate your My Chart account. Instructions are located on the last page of this  paperwork. If you have not heard from [place] regarding the results in 2 weeks, please contact this office.     Skin Tag, Adult  Duarte skin tag (acrochordon) is Duarte soft, extra growth of skin. Most skin tags are flesh-colored and rarely bigger than Duarte pencil eraser. They commonly form near areas where there are folds in the skin, such as the armpit or groin. Skin tags are not dangerous, and they do not spread from person to person (are not contagious). You may have one skin tag or several. Skin tags do not require treatment. However, your health care provider may recommend removal of Duarte skin tag if it:  Gets irritated from clothing.  Bleeds.  Is visible and unsightly. Your health care provider can remove skin tags with Duarte simple surgical procedure or Duarte procedure that involves freezing the skin tag. Follow these instructions at home:  Watch for any changes in your skin tag. Duarte normal skin tag does not require any other special care at home.  Take over-the-counter and prescription medicines only as told by your health care provider.  Keep all follow-up visits as told by your health care provider. This is important. Contact Duarte health care provider if:  You have Duarte skin tag that: ? Becomes painful. ? Changes color. ? Bleeds. ? Swells.  You develop more skin tags. This information is not intended to replace advice given to you by your health care provider. Make sure you discuss any questions you have with your health care provider. Document Revised: 12/12/2016 Document Reviewed: 01/14/2015 Elsevier Patient Education  2020 Elsevier Inc.      Edwina Barth, MD [facility]

## 2020-01-03 NOTE — Patient Instructions (Addendum)
     If you have lab work done today you will be contacted with your lab results within the next 2 weeks.  If you have not heard from [place] then please contact us. The fastest way to get your results is to register for My Chart.   IF you received an x-ray today, you will receive an invoice from [facility]. Please contact [facility] at [phone] with questions or concerns regarding your invoice.   IF you received labwork today, you will receive an invoice from [facility]. Please contact [facility] at [phone] with questions or concerns regarding your invoice.   Our billing staff will not be able to assist you with questions regarding bills from these companies.  You will be contacted with the lab results as soon as they are available. The fastest way to get your results is to activate your My Chart account. Instructions are located on the last page of this paperwork. If you have not heard from [place] regarding the results in 2 weeks, please contact this office.     Skin Tag, Adult  A skin tag (acrochordon) is a soft, extra growth of skin. Most skin tags are flesh-colored and rarely bigger than a pencil eraser. They commonly form near areas where there are folds in the skin, such as the armpit or groin. Skin tags are not dangerous, and they do not spread from person to person (are not contagious). You may have one skin tag or several. Skin tags do not require treatment. However, your health care provider may recommend removal of a skin tag if it:  Gets irritated from clothing.  Bleeds.  Is visible and unsightly. Your health care provider can remove skin tags with a simple surgical procedure or a procedure that involves freezing the skin tag. Follow these instructions at home:  Watch for any changes in your skin tag. A normal skin tag does not require any other special care at home.  Take over-the-counter and prescription medicines only as told by your health care  provider.  Keep all follow-up visits as told by your health care provider. This is important. Contact a health care provider if:  You have a skin tag that: ? Becomes painful. ? Changes color. ? Bleeds. ? Swells.  You develop more skin tags. This information is not intended to replace advice given to you by your health care provider. Make sure you discuss any questions you have with your health care provider. Document Revised: 12/12/2016 Document Reviewed: 01/14/2015 Elsevier Patient Education  2020 [employer].

## 2020-03-21 LAB — OB RESULTS CONSOLE HEPATITIS B SURFACE ANTIGEN: Hepatitis B Surface Ag: NEGATIVE

## 2020-03-21 LAB — OB RESULTS CONSOLE GC/CHLAMYDIA: Gonorrhea: NEGATIVE

## 2020-03-21 LAB — OB RESULTS CONSOLE RPR: RPR: NONREACTIVE

## 2020-03-21 LAB — OB RESULTS CONSOLE HIV ANTIBODY (ROUTINE TESTING): HIV: NONREACTIVE

## 2020-03-21 LAB — OB RESULTS CONSOLE RUBELLA ANTIBODY, IGM: Rubella: IMMUNE

## 2020-03-21 LAB — OB RESULTS CONSOLE VARICELLA ZOSTER ANTIBODY, IGG: Varicella: IMMUNE

## 2020-04-18 ENCOUNTER — Ambulatory Visit: Payer: Self-pay | Admitting: Emergency Medicine

## 2020-04-26 NOTE — Telephone Encounter (Signed)
Monitor ws never returned and marked "lost" in the [place] system. Order will be cancelled

## 2020-05-08 ENCOUNTER — Other Ambulatory Visit: Payer: Self-pay | Admitting: Emergency Medicine

## 2020-05-08 DIAGNOSIS — E118 Type 2 diabetes mellitus with unspecified complications: Secondary | ICD-10-CM

## 2020-08-08 LAB — OB RESULTS CONSOLE HIV ANTIBODY (ROUTINE TESTING): HIV: NONREACTIVE

## 2020-08-08 LAB — OB RESULTS CONSOLE RPR: RPR: NONREACTIVE

## 2020-08-09 ENCOUNTER — Other Ambulatory Visit: Payer: Self-pay | Admitting: Emergency Medicine

## 2020-08-09 DIAGNOSIS — E118 Type 2 diabetes mellitus with unspecified complications: Secondary | ICD-10-CM

## 2020-10-13 ENCOUNTER — Inpatient Hospital Stay (HOSPITAL_BASED_OUTPATIENT_CLINIC_OR_DEPARTMENT_OTHER): Payer: 59

## 2020-10-13 ENCOUNTER — Inpatient Hospital Stay (HOSPITAL_COMMUNITY)
Admission: AD | Admit: 2020-10-13 | Discharge: 2020-10-17 | DRG: 787 | Disposition: A | Discharge status: Home / Self Care | Payer: 59 | Attending: Obstetrics and Gynecology | Admitting: Obstetrics and Gynecology

## 2020-10-13 ENCOUNTER — Encounter (HOSPITAL_COMMUNITY): Payer: Self-pay | Admitting: Obstetrics

## 2020-10-13 ENCOUNTER — Other Ambulatory Visit: Payer: Self-pay

## 2020-10-13 DIAGNOSIS — O1002 Pre-existing essential hypertension complicating childbirth: Secondary | ICD-10-CM | POA: Diagnosis present

## 2020-10-13 DIAGNOSIS — Z3A36 36 weeks gestation of pregnancy: Secondary | ICD-10-CM

## 2020-10-13 DIAGNOSIS — O9081 Anemia of the puerperium: Secondary | ICD-10-CM | POA: Diagnosis not present

## 2020-10-13 DIAGNOSIS — O2412 Pre-existing diabetes mellitus, type 2, in childbirth: Principal | ICD-10-CM | POA: Diagnosis present

## 2020-10-13 DIAGNOSIS — O133 Gestational [pregnancy-induced] hypertension without significant proteinuria, third trimester: Secondary | ICD-10-CM

## 2020-10-13 DIAGNOSIS — O4693 Antepartum hemorrhage, unspecified, third trimester: Secondary | ICD-10-CM

## 2020-10-13 DIAGNOSIS — O99824 Streptococcus B carrier state complicating childbirth: Secondary | ICD-10-CM | POA: Diagnosis present

## 2020-10-13 DIAGNOSIS — Z7984 Long term (current) use of oral hypoglycemic drugs: Secondary | ICD-10-CM

## 2020-10-13 DIAGNOSIS — Z20822 Contact with and (suspected) exposure to covid-19: Secondary | ICD-10-CM | POA: Diagnosis present

## 2020-10-13 DIAGNOSIS — D62 Acute posthemorrhagic anemia: Secondary | ICD-10-CM | POA: Diagnosis not present

## 2020-10-13 DIAGNOSIS — E119 Type 2 diabetes mellitus without complications: Secondary | ICD-10-CM | POA: Diagnosis present

## 2020-10-13 DIAGNOSIS — O34211 Maternal care for low transverse scar from previous cesarean delivery: Secondary | ICD-10-CM | POA: Diagnosis present

## 2020-10-13 DIAGNOSIS — Z98891 History of uterine scar from previous surgery: Secondary | ICD-10-CM

## 2020-10-13 LAB — CBC
HCT: 33.4 % — ABNORMAL LOW (ref 36.0–46.0)
Hemoglobin: 11 g/dL — ABNORMAL LOW (ref 12.0–15.0)
MCH: 28.2 pg (ref 26.0–34.0)
MCHC: 32.9 g/dL (ref 30.0–36.0)
MCV: 85.6 fL (ref 80.0–100.0)
Platelets: 163 10*3/uL (ref 150–400)
RBC: 3.9 MIL/uL (ref 3.87–5.11)
RDW: 15.6 % — ABNORMAL HIGH (ref 11.5–15.5)
WBC: 9.7 10*3/uL (ref 4.0–10.5)
nRBC: 0 % (ref 0.0–0.2)

## 2020-10-13 LAB — RESP PANEL BY RT-PCR (FLU A&B, COVID) ARPGX2
Influenza A by PCR: NEGATIVE
Influenza B by PCR: NEGATIVE
SARS Coronavirus 2 by RT PCR: NEGATIVE

## 2020-10-13 LAB — GLUCOSE, CAPILLARY
Glucose-Capillary: 56 mg/dL — ABNORMAL LOW (ref 70–99)
Glucose-Capillary: 85 mg/dL (ref 70–99)
Glucose-Capillary: 129 mg/dL — ABNORMAL HIGH (ref 70–99)

## 2020-10-13 LAB — TYPE AND SCREEN
ABO/RH(D): O POS
Antibody Screen: NEGATIVE

## 2020-10-13 MED ORDER — DOCUSATE SODIUM 100 MG PO CAPS
100.0000 mg | ORAL_CAPSULE | Freq: Every day | ORAL | Status: DC
  Administered 2020-10-13: 100 mg via ORAL
  Filled 2020-10-13: qty 1

## 2020-10-13 MED ORDER — INSULIN NPH (HUMAN) (ISOPHANE) 100 UNIT/ML ~~LOC~~ SUSP
30.0000 [IU] | Freq: Two times a day (BID) | SUBCUTANEOUS | Status: DC
  Filled 2020-10-13: qty 10

## 2020-10-13 MED ORDER — NIFEDIPINE ER OSMOTIC RELEASE 30 MG PO TB24: 30.0000 mg | ORAL_TABLET | Freq: Once | ORAL | Status: DC

## 2020-10-13 MED ORDER — CALCIUM CARBONATE ANTACID 500 MG PO CHEW: 2.0000 | CHEWABLE_TABLET | ORAL | Status: DC | PRN

## 2020-10-13 MED ORDER — ASPIRIN 81 MG PO CHEW
162.0000 mg | CHEWABLE_TABLET | Freq: Every day | ORAL | Status: DC
  Administered 2020-10-13: 162 mg via ORAL
  Filled 2020-10-13: qty 2

## 2020-10-13 MED ORDER — ACETAMINOPHEN 325 MG PO TABS: 650.0000 mg | ORAL_TABLET | ORAL | Status: DC | PRN

## 2020-10-13 MED ORDER — INSULIN ASPART 100 UNIT/ML IJ SOLN: 30.0000 [IU] | Freq: Three times a day (TID) | INTRAMUSCULAR | Status: DC

## 2020-10-13 MED ORDER — NIFEDIPINE ER OSMOTIC RELEASE 30 MG PO TB24
30.0000 mg | ORAL_TABLET | Freq: Every day | ORAL | Status: DC
  Administered 2020-10-13 – 2020-10-17 (×5): 30 mg via ORAL
  Filled 2020-10-13 (×5): qty 1

## 2020-10-13 MED ORDER — PRENATAL MULTIVITAMIN CH
1.0000 | ORAL_TABLET | Freq: Every day | ORAL | Status: DC
  Administered 2020-10-13: 1 via ORAL
  Filled 2020-10-13: qty 1

## 2020-10-13 MED ORDER — LACTATED RINGERS IV BOLUS
500.0000 mL | Freq: Once | INTRAVENOUS | Status: AC
  Administered 2020-10-13: 500 mL via INTRAVENOUS

## 2020-10-13 MED ORDER — INSULIN NPH (HUMAN) (ISOPHANE) 100 UNIT/ML ~~LOC~~ SUSP
15.0000 [IU] | Freq: Two times a day (BID) | SUBCUTANEOUS | Status: DC
  Administered 2020-10-13: 15 [IU] via SUBCUTANEOUS
  Filled 2020-10-13: qty 10

## 2020-10-13 MED ORDER — NON FORMULARY: 30.0000 [IU] | Freq: Two times a day (BID) | Status: DC

## 2020-10-13 MED ORDER — DEXTROSE 50 % IV SOLN
INTRAVENOUS | Status: AC
  Administered 2020-10-13: 50 mL
  Filled 2020-10-13: qty 50

## 2020-10-13 MED ORDER — METFORMIN HCL 500 MG PO TABS: 500.0000 mg | ORAL_TABLET | Freq: Two times a day (BID) | ORAL | Status: DC

## 2020-10-13 NOTE — MAU Provider Note (Signed)
History     CSN: [id number]  Arrival date and time: 10/13/20 0119   Event Date/Time   First Provider Initiated Contact with Patient 10/13/20 0208      Chief Complaint  Patient presents with   Vaginal Bleeding   HPI Pamela Duarte is a 35 y.o. G2P1001 at [redacted]w[redacted]d who presents to MAU with chief complaint of recurrent vaginal bleeding. This is a new problem, onset today and intensifying over time. She endorses passing multiple small dark red clots throughout the day. She denies decreased fetal movement, fever, falls, or recent illness.   Patient also complains of lower abdominal and low back pain. These are recurrent problems which worsened significantly today. She denies aggravating or alleviating factors. She has not taken medication or tried other treatments for this complaint.  Patient receives care with [facility]. She is scheduled for repeat cesarean.  OB History     Gravida  2   Para  1   Term  1   Preterm      AB      Living  1      SAB      IAB      Ectopic      Multiple  0   Live Births  1           Past Medical History:  Diagnosis Date   Diabetes mellitus without complication (HCC)    insulin type 2   PCOS (polycystic ovarian syndrome)     Past Surgical History:  Procedure Laterality Date   ABDOMINOPLASTY  2015   CESAREAN SECTION N/A 03/30/2019   Procedure: CESAREAN SECTION;  Surgeon: Huel Cote, MD;  Location: [facility] LD [facility]S;  Service: Obstetrics;  Laterality: N/A;   COSMETIC SURGERY      Family History  Problem Relation Age of Onset   Diabetes Maternal Grandmother    Hypertension Maternal Grandmother    Diabetes Paternal Grandfather    Hypertension Paternal Grandfather    Cancer Paternal Grandfather    Hypertension Paternal Grandmother    Diabetes Paternal Grandmother    Hypertension Mother     Social History   Tobacco Use   Smoking status: Never   Smokeless tobacco: Never  Vaping Use   Vaping Use: Never used   Substance Use Topics   Alcohol use: No   Drug use: No    Allergies: No Known Allergies  Medications Prior to Admission  Medication Sig Dispense Refill Last Dose   insulin lispro (HUMALOG) 100 UNIT/ML injection Inject 30 Units into the skin 3 (three) times daily before meals.      insulin NPH-regular Human (70-30) 100 UNIT/ML injection Inject 30 Units into the skin 2 (two) times daily.      metFORMIN (GLUCOPHAGE-XR) 500 MG 24 hr tablet Take 2 tablets by mouth twice daily 360 tablet 0 10/12/2020   Prenatal Vit-Fe Fumarate-FA (PRENATAL MULTIVITAMIN) TABS tablet Take 1 tablet by mouth daily at 12 noon.   10/12/2020   amLODipine (NORVASC) 5 MG tablet Take 1 tablet (5 mg total) by mouth daily. 90 tablet 3    Continuous Blood Gluc Sensor (FREESTYLE LIBRE 14 DAY SENSOR) MISC 1 application by Does not apply route daily. 6 each 4    Dulaglutide (TRULICITY) 0.75 MG/0.5ML SOPN Inject 0.75 mg into the skin once a week. Every Wednesday 6 mL 5    folic acid (FOLVITE) 800 MCG tablet Take 400 mcg by mouth daily.      glucose blood (FREESTYLE TEST STRIPS) test  strip Use as instructed Dx code: E11.9-Dispense Freestyle Libre test strips 100 each 12    metoprolol succinate (TOPROL XL) 25 MG 24 hr tablet Take 1 tablet (25 mg total) by mouth daily. (Patient not taking: No sig reported) 30 tablet 3    oxyCODONE (OXY IR/ROXICODONE) 5 MG immediate release tablet Take 1-2 tablets (5-10 mg total) by mouth every 4 (four) hours as needed for severe pain. 15 tablet 0     Review of Systems  Gastrointestinal:  Positive for abdominal pain.  Genitourinary:  Positive for vaginal bleeding.  Musculoskeletal:  Positive for back pain.  All other systems reviewed and are negative. Physical Exam   Blood pressure (!) 110/55, pulse (!) 103, temperature 98.7 F (37.1 C), temperature source Oral, resp. rate 17, last menstrual period 12/23/2019, SpO2 97 %, currently breastfeeding.  Physical Exam Vitals and nursing note reviewed.  Exam conducted with a chaperone present.  Constitutional:      Appearance: Normal appearance.  Cardiovascular:     Rate and Rhythm: Normal rate.     Pulses: Normal pulses.  Pulmonary:     Effort: Pulmonary effort is normal.  Abdominal:     Comments: Gravid  Genitourinary:    Comments: Pelvic exam: External genitalia normal, vaginal walls pink and well rugated, cervix visually closed, no lesions noted. Moderate amount of dark red blood visible near cervical os. SVE: tight 2/thick/-3.    Skin:    Capillary Refill: Capillary refill takes less than 2 seconds.  Neurological:     Mental Status: She is alert and oriented to person, place, and time.  Psychiatric:        Mood and Affect: Mood normal.        Behavior: Behavior normal.        Thought Content: Thought content normal.        Judgment: Judgment normal.    MAU Course  Procedures  --Reactive tracing: baseline 145, mod var, + accels, no decels --Toco: irregular contractions q 2-4 min --Discussed with Drs Para March and Chestine Spore, who advise admission to [facility]   Orders Placed This Encounter  Procedures   [place] MFM OB Limited   Patient Vitals for the past 24 hrs:  BP Temp Temp src Pulse Resp SpO2  10/13/20 0259 -- -- -- -- -- 97 %  10/13/20 0256 (!) 110/55 98.7 F (37.1 C) Oral (!) 103 17 --   Assessment and Plan  --35 y.o. G2P1001 at [redacted]w[redacted]d  --Reactive tracing --Cervix thick 2/thick, essentially unchanged from office --Vaginal bleeding in third trimester --For repeat cesarean --T2DM on Insulin --HTN on Procardia 30 XL --Per Dr. Chestine Spore, admit to [facility], CNM 10/13/2020, 3:23 AM

## 2020-10-13 NOTE — MAU Note (Signed)
..  Pamela Duarte is a 35 y.o. at [redacted]w[redacted]d here in MAU reporting: vaginal bleeding that soaked through her pad. Reports constant abdominal and back pain that she has had throughout the pregnancy but has gotten worse today. +FM.  Pain score: 6/10 back and 7/10 abdominal

## 2020-10-13 NOTE — H&P (Signed)
35 y.o. G2P1001 @ [redacted]w[redacted]d presents with vaginal bleeding in the third trimester.  Patient reports an episode of dark blood with wiping after voiding at 2300.  She left work and upon arriving home an hour later had saturated about 1/2 pad, so she presented to MAU.  She reports mild lower abdominal tightening, not painful.  This was present prior to bleeding and has not increased in frequency or intensity.   She denies abdominal trauma or recent intercourse.  Last cervical check in office was 9/28. She denies leakage of fluid and endorses good fetal movement.   Pregnancy complicated by: Hypertension: on procardia XL 30mg  dialy Type 2 diabetes: initial A1c in pregnancy.  Current regimen NPH 30 BID, Lispro 30 BID, and metformin 500 mg PO BID History of cesarean section: for arrest of dilation, category 2 fetal tracing.  Desires elective repeat 4.  Advanced maternal age: low risk NIPT, on low dose aspirin 5.  Low lying placenta / marginal previa at anatomy scan; resolved at f/u scan at 24 weeks    Past Medical History:  Diagnosis Date   Diabetes mellitus without complication (HCC)    insulin type 2   PCOS (polycystic ovarian syndrome)     Past Surgical History:  Procedure Laterality Date   ABDOMINOPLASTY  2015   CESAREAN SECTION N/A 03/30/2019   Procedure: CESAREAN SECTION;  Surgeon: 04/01/2019, MD;  Location: [facility] LD ORS;  Service: Obstetrics;  Laterality: N/A;   COSMETIC SURGERY      OB History  Gravida Para Term Preterm AB Living  2 1 1     1   SAB IAB Ectopic Multiple Live Births        0 1    # Outcome Date GA Lbr Len/2nd Weight Sex Delivery Anes PTL Lv  2 Current           1 Term 03/30/19 [redacted]w[redacted]d  4332 g F CS-LTranv EPI  LIV    Social History   Socioeconomic History   Marital status: Married    Spouse name: Not on file   Number of children: 1   Years of education: Not on file   Highest education level: Not on file  Occupational History   Not on file  Tobacco Use    Smoking status: Never   Smokeless tobacco: Never  Vaping Use   Vaping Use: Never used  Substance and Sexual Activity   Alcohol use: No   Drug use: No   Sexual activity: Not Currently  Other Topics Concern   Not on file  Social History Narrative   Not on file   Social Determinants of Health   Financial Resource Strain: Not on file  Food Insecurity: Not on file  Transportation Needs: Not on file  Physical Activity: Not on file  Stress: Not on file  Social Connections: Not on file  Intimate Partner Violence: Not on file   Patient has no known allergies.    Prenatal Transfer Tool  Maternal Diabetes: Yes:  Diabetes Type:  Insulin/Medication controlled Genetic Screening: Normal Maternal Ultrasounds/Referrals: Normal Fetal Ultrasounds or other Referrals:  None Maternal Substance Abuse:  No Significant Maternal Medications:  Meds include: Other:  as above, insulin, procardia XL, low dose aspirin, metformin Significant Maternal Lab Results: Group B Strep positive   Vitals:   10/13/20 0256 10/13/20 0259  BP: (!) 110/55   Pulse: (!) 103   Resp: 17   Temp: 98.7 F (37.1 C)   SpO2:  97%  General:  NAD Abdomen:  soft, gravid, non-tender SSE:  per CNM: moderate old dark blood in vagina, no active bleeding.  SVE 2/long/high  Current peripad with no blood x 2 hours FHTs:  140s, moderate variability.  Rare short, sharp variable.  Reactive in MAU Toco:  uterine irritability   A/P   35 y.o. G2P1001 [redacted]w[redacted]d presents with new onset vaginal bleeding in third trimester More bleeding than anticipated from cervical check or mild cervical change.  Will admit to antepartum for observation.  Discussed with patient that would consider delivery for abnormalities of fetal heart rate tracing or progression of bleeding.  Will keep NPO at this time in event urgent cesarean is needed.   History of cesarean section: plan repeat HTN: continue procardia XL 30 mg daily DM2: Will administer 1/2  dose NPH now while NPO, hold lispro, metformin until diet resumes.    [facility] GEFFEL [employer]

## 2020-10-14 ENCOUNTER — Inpatient Hospital Stay (HOSPITAL_COMMUNITY): Payer: 59 | Admitting: Anesthesiology

## 2020-10-14 ENCOUNTER — Encounter (HOSPITAL_COMMUNITY)
Admission: AD | Disposition: A | Discharge status: Home / Self Care | Payer: Self-pay | Source: Home / Self Care | Attending: Obstetrics and Gynecology

## 2020-10-14 ENCOUNTER — Encounter (HOSPITAL_COMMUNITY): Payer: Self-pay | Admitting: Obstetrics

## 2020-10-14 DIAGNOSIS — O1002 Pre-existing essential hypertension complicating childbirth: Secondary | ICD-10-CM | POA: Diagnosis present

## 2020-10-14 DIAGNOSIS — E119 Type 2 diabetes mellitus without complications: Secondary | ICD-10-CM | POA: Diagnosis present

## 2020-10-14 DIAGNOSIS — Z98891 History of uterine scar from previous surgery: Secondary | ICD-10-CM

## 2020-10-14 DIAGNOSIS — O9081 Anemia of the puerperium: Secondary | ICD-10-CM | POA: Diagnosis not present

## 2020-10-14 DIAGNOSIS — O4693 Antepartum hemorrhage, unspecified, third trimester: Secondary | ICD-10-CM | POA: Diagnosis present

## 2020-10-14 DIAGNOSIS — O99824 Streptococcus B carrier state complicating childbirth: Secondary | ICD-10-CM | POA: Diagnosis present

## 2020-10-14 DIAGNOSIS — O2412 Pre-existing diabetes mellitus, type 2, in childbirth: Secondary | ICD-10-CM | POA: Diagnosis present

## 2020-10-14 DIAGNOSIS — Z3A36 36 weeks gestation of pregnancy: Secondary | ICD-10-CM | POA: Diagnosis not present

## 2020-10-14 DIAGNOSIS — Z7984 Long term (current) use of oral hypoglycemic drugs: Secondary | ICD-10-CM | POA: Diagnosis not present

## 2020-10-14 DIAGNOSIS — D62 Acute posthemorrhagic anemia: Secondary | ICD-10-CM | POA: Diagnosis not present

## 2020-10-14 DIAGNOSIS — O34211 Maternal care for low transverse scar from previous cesarean delivery: Secondary | ICD-10-CM | POA: Diagnosis present

## 2020-10-14 DIAGNOSIS — Z20822 Contact with and (suspected) exposure to covid-19: Secondary | ICD-10-CM | POA: Diagnosis present

## 2020-10-14 SURGERY — Surgical Case
Anesthesia: Spinal

## 2020-10-14 MED ORDER — OXYTOCIN-SODIUM CHLORIDE 30-0.9 UT/500ML-% IV SOLN
INTRAVENOUS | Status: DC | PRN
  Administered 2020-10-14: 300 mL via INTRAVENOUS

## 2020-10-14 MED ORDER — NALOXONE HCL 0.4 MG/ML IJ SOLN: 0.4000 mg | INTRAMUSCULAR | Status: DC | PRN

## 2020-10-14 MED ORDER — SENNOSIDES-DOCUSATE SODIUM 8.6-50 MG PO TABS
2.0000 | ORAL_TABLET | ORAL | Status: DC
  Administered 2020-10-15 – 2020-10-16 (×2): 2 via ORAL
  Filled 2020-10-14 (×2): qty 2

## 2020-10-14 MED ORDER — METOCLOPRAMIDE HCL 5 MG/ML IJ SOLN
INTRAMUSCULAR | Status: AC
  Filled 2020-10-14: qty 2

## 2020-10-14 MED ORDER — LACTATED RINGERS IV SOLN: INTRAVENOUS | Status: DC | PRN

## 2020-10-14 MED ORDER — ENOXAPARIN SODIUM 80 MG/0.8ML IJ SOSY
70.0000 mg | PREFILLED_SYRINGE | INTRAMUSCULAR | Status: DC
  Administered 2020-10-15 – 2020-10-17 (×3): 70 mg via SUBCUTANEOUS
  Filled 2020-10-14 (×3): qty 0.8

## 2020-10-14 MED ORDER — NALBUPHINE HCL 10 MG/ML IJ SOLN
5.0000 mg | Freq: Once | INTRAMUSCULAR | Status: AC | PRN
  Administered 2020-10-14: 5 mg via INTRAVENOUS

## 2020-10-14 MED ORDER — PROMETHAZINE HCL 25 MG/ML IJ SOLN: 6.2500 mg | INTRAMUSCULAR | Status: DC | PRN

## 2020-10-14 MED ORDER — MORPHINE SULFATE (PF) 0.5 MG/ML IJ SOLN
INTRAMUSCULAR | Status: DC | PRN
  Administered 2020-10-14: 150 ug via INTRATHECAL

## 2020-10-14 MED ORDER — OXYTOCIN-SODIUM CHLORIDE 30-0.9 UT/500ML-% IV SOLN: 2.5000 [IU]/h | INTRAVENOUS | Status: AC

## 2020-10-14 MED ORDER — DIBUCAINE (PERIANAL) 1 % EX OINT: 1.0000 "application " | TOPICAL_OINTMENT | CUTANEOUS | Status: DC | PRN

## 2020-10-14 MED ORDER — CEFAZOLIN IN SODIUM CHLORIDE 3-0.9 GM/100ML-% IV SOLN
INTRAVENOUS | Status: AC
  Filled 2020-10-14: qty 100

## 2020-10-14 MED ORDER — COCONUT OIL OIL: 1.0000 "application " | TOPICAL_OIL | Status: DC | PRN

## 2020-10-14 MED ORDER — SIMETHICONE 80 MG PO CHEW: 80.0000 mg | CHEWABLE_TABLET | ORAL | Status: DC | PRN

## 2020-10-14 MED ORDER — HYDROMORPHONE HCL 1 MG/ML IJ SOLN: 0.2500 mg | INTRAMUSCULAR | Status: DC | PRN

## 2020-10-14 MED ORDER — SOD CITRATE-CITRIC ACID 500-334 MG/5ML PO SOLN
ORAL | Status: AC
  Filled 2020-10-14: qty 30

## 2020-10-14 MED ORDER — SCOPOLAMINE 1 MG/3DAYS TD PT72
MEDICATED_PATCH | TRANSDERMAL | Status: AC
  Filled 2020-10-14: qty 1

## 2020-10-14 MED ORDER — METOCLOPRAMIDE HCL 5 MG/ML IJ SOLN
INTRAMUSCULAR | Status: DC | PRN
  Administered 2020-10-14: 10 mg via INTRAVENOUS

## 2020-10-14 MED ORDER — ONDANSETRON HCL 4 MG/2ML IJ SOLN
INTRAMUSCULAR | Status: DC | PRN
  Administered 2020-10-14: 4 mg via INTRAVENOUS

## 2020-10-14 MED ORDER — ACETAMINOPHEN 10 MG/ML IV SOLN
INTRAVENOUS | Status: DC | PRN
  Administered 2020-10-14: 1000 mg via INTRAVENOUS

## 2020-10-14 MED ORDER — DIPHENHYDRAMINE HCL 25 MG PO CAPS: 25.0000 mg | ORAL_CAPSULE | Freq: Four times a day (QID) | ORAL | Status: DC | PRN

## 2020-10-14 MED ORDER — NALBUPHINE HCL 10 MG/ML IJ SOLN
5.0000 mg | INTRAMUSCULAR | Status: DC | PRN
  Administered 2020-10-14 – 2020-10-15 (×2): 5 mg via INTRAVENOUS
  Filled 2020-10-14 (×3): qty 1

## 2020-10-14 MED ORDER — KETOROLAC TROMETHAMINE 30 MG/ML IJ SOLN
30.0000 mg | Freq: Four times a day (QID) | INTRAMUSCULAR | Status: AC | PRN
  Administered 2020-10-14: 30 mg via INTRAMUSCULAR

## 2020-10-14 MED ORDER — MORPHINE SULFATE (PF) 0.5 MG/ML IJ SOLN
INTRAMUSCULAR | Status: AC
  Filled 2020-10-14: qty 10

## 2020-10-14 MED ORDER — BUPIVACAINE IN DEXTROSE 0.75-8.25 % IT SOLN
INTRATHECAL | Status: DC | PRN
  Administered 2020-10-14: 1.6 mL via INTRATHECAL

## 2020-10-14 MED ORDER — MENTHOL 3 MG MT LOZG: 1.0000 | LOZENGE | OROMUCOSAL | Status: DC | PRN

## 2020-10-14 MED ORDER — IBUPROFEN 600 MG PO TABS
600.0000 mg | ORAL_TABLET | Freq: Four times a day (QID) | ORAL | Status: DC
  Administered 2020-10-15 – 2020-10-17 (×9): 600 mg via ORAL
  Filled 2020-10-14 (×9): qty 1

## 2020-10-14 MED ORDER — DEXTROSE 5 % IV SOLN
INTRAVENOUS | Status: DC | PRN
  Administered 2020-10-14: 3 g via INTRAVENOUS

## 2020-10-14 MED ORDER — OXYCODONE HCL 5 MG/5ML PO SOLN: 5.0000 mg | Freq: Once | ORAL | Status: DC

## 2020-10-14 MED ORDER — ONDANSETRON HCL 4 MG/2ML IJ SOLN
INTRAMUSCULAR | Status: AC
  Filled 2020-10-14: qty 2

## 2020-10-14 MED ORDER — PHENYLEPHRINE HCL-NACL 20-0.9 MG/250ML-% IV SOLN
INTRAVENOUS | Status: AC
  Filled 2020-10-14: qty 250

## 2020-10-14 MED ORDER — ACETAMINOPHEN 10 MG/ML IV SOLN
INTRAVENOUS | Status: AC
  Filled 2020-10-14: qty 100

## 2020-10-14 MED ORDER — TETANUS-DIPHTH-ACELL PERTUSSIS 5-2.5-18.5 LF-MCG/0.5 IM SUSY: 0.5000 mL | PREFILLED_SYRINGE | Freq: Once | INTRAMUSCULAR | Status: DC

## 2020-10-14 MED ORDER — PRENATAL MULTIVITAMIN CH
1.0000 | ORAL_TABLET | Freq: Every day | ORAL | Status: DC
  Administered 2020-10-15 – 2020-10-16 (×2): 1 via ORAL
  Filled 2020-10-14 (×3): qty 1

## 2020-10-14 MED ORDER — METHYLERGONOVINE MALEATE 0.2 MG PO TABS
0.2000 mg | ORAL_TABLET | ORAL | Status: DC | PRN
Start: 2020-10-14 — End: 2020-10-17

## 2020-10-14 MED ORDER — DIPHENHYDRAMINE HCL 25 MG PO CAPS
25.0000 mg | ORAL_CAPSULE | ORAL | Status: DC | PRN
  Administered 2020-10-14 – 2020-10-15 (×3): 25 mg via ORAL
  Filled 2020-10-14 (×3): qty 1

## 2020-10-14 MED ORDER — ONDANSETRON HCL 4 MG/2ML IJ SOLN
4.0000 mg | Freq: Three times a day (TID) | INTRAMUSCULAR | Status: DC | PRN
  Administered 2020-10-14: 4 mg via INTRAVENOUS
  Filled 2020-10-14: qty 2

## 2020-10-14 MED ORDER — KETOROLAC TROMETHAMINE 30 MG/ML IJ SOLN: 30.0000 mg | Freq: Four times a day (QID) | INTRAMUSCULAR | Status: AC | PRN

## 2020-10-14 MED ORDER — WITCH HAZEL-GLYCERIN EX PADS: 1.0000 "application " | MEDICATED_PAD | CUTANEOUS | Status: DC | PRN

## 2020-10-14 MED ORDER — SODIUM CHLORIDE 0.9% FLUSH: 3.0000 mL | INTRAVENOUS | Status: DC | PRN

## 2020-10-14 MED ORDER — METHYLERGONOVINE MALEATE 0.2 MG/ML IJ SOLN
0.2000 mg | INTRAMUSCULAR | Status: DC | PRN
Start: 2020-10-14 — End: 2020-10-17

## 2020-10-14 MED ORDER — ACETAMINOPHEN 500 MG PO TABS
1000.0000 mg | ORAL_TABLET | Freq: Four times a day (QID) | ORAL | Status: AC
  Administered 2020-10-14 – 2020-10-15 (×3): 1000 mg via ORAL
  Filled 2020-10-14 (×3): qty 2

## 2020-10-14 MED ORDER — PHENYLEPHRINE HCL-NACL 20-0.9 MG/250ML-% IV SOLN
INTRAVENOUS | Status: DC | PRN
  Administered 2020-10-14: 60 ug/min via INTRAVENOUS

## 2020-10-14 MED ORDER — ACETAMINOPHEN 500 MG PO TABS: 1000.0000 mg | ORAL_TABLET | Freq: Four times a day (QID) | ORAL | Status: DC

## 2020-10-14 MED ORDER — OXYCODONE HCL 5 MG PO TABS: 5.0000 mg | ORAL_TABLET | Freq: Once | ORAL | Status: DC | PRN

## 2020-10-14 MED ORDER — ZOLPIDEM TARTRATE 5 MG PO TABS: 5.0000 mg | ORAL_TABLET | Freq: Every evening | ORAL | Status: DC | PRN

## 2020-10-14 MED ORDER — NALBUPHINE HCL 10 MG/ML IJ SOLN
5.0000 mg | INTRAMUSCULAR | Status: DC | PRN
Start: 2020-10-14 — End: 2020-10-17

## 2020-10-14 MED ORDER — LACTATED RINGERS IV SOLN: INTRAVENOUS | Status: DC

## 2020-10-14 MED ORDER — NALOXONE HCL 4 MG/10ML IJ SOLN
1.0000 ug/kg/h | INTRAVENOUS | Status: DC | PRN
  Filled 2020-10-14: qty 5

## 2020-10-14 MED ORDER — KETOROLAC TROMETHAMINE 30 MG/ML IJ SOLN: 30.0000 mg | Freq: Once | INTRAMUSCULAR | Status: DC | PRN

## 2020-10-14 MED ORDER — NALBUPHINE HCL 10 MG/ML IJ SOLN
INTRAMUSCULAR | Status: AC
  Filled 2020-10-14: qty 1

## 2020-10-14 MED ORDER — METFORMIN HCL ER 500 MG PO TB24
1000.0000 mg | ORAL_TABLET | Freq: Two times a day (BID) | ORAL | Status: DC
  Administered 2020-10-14 – 2020-10-17 (×6): 1000 mg via ORAL
  Filled 2020-10-14 (×8): qty 2

## 2020-10-14 MED ORDER — MEASLES, MUMPS & RUBELLA VAC IJ SOLR: 0.5000 mL | Freq: Once | INTRAMUSCULAR | Status: DC

## 2020-10-14 MED ORDER — SODIUM CHLORIDE 0.9 % IR SOLN
Status: DC | PRN
  Administered 2020-10-14: 1

## 2020-10-14 MED ORDER — FENTANYL CITRATE (PF) 100 MCG/2ML IJ SOLN
INTRAMUSCULAR | Status: AC
  Filled 2020-10-14: qty 2

## 2020-10-14 MED ORDER — SCOPOLAMINE 1 MG/3DAYS TD PT72
1.0000 | MEDICATED_PATCH | Freq: Once | TRANSDERMAL | Status: AC
  Administered 2020-10-14: 1.5 mg via TRANSDERMAL

## 2020-10-14 MED ORDER — MAGNESIUM HYDROXIDE 400 MG/5ML PO SUSP: 30.0000 mL | ORAL | Status: DC | PRN

## 2020-10-14 MED ORDER — PHENYLEPHRINE 40 MCG/ML (10ML) SYRINGE FOR IV PUSH (FOR BLOOD PRESSURE SUPPORT)
PREFILLED_SYRINGE | INTRAVENOUS | Status: AC
  Filled 2020-10-14: qty 10

## 2020-10-14 MED ORDER — DEXAMETHASONE SODIUM PHOSPHATE 4 MG/ML IJ SOLN
INTRAMUSCULAR | Status: DC | PRN
  Administered 2020-10-14: 4 mg via INTRAVENOUS

## 2020-10-14 MED ORDER — MEPERIDINE HCL 25 MG/ML IJ SOLN: 6.2500 mg | INTRAMUSCULAR | Status: DC | PRN

## 2020-10-14 MED ORDER — DIPHENHYDRAMINE HCL 50 MG/ML IJ SOLN: 12.5000 mg | INTRAMUSCULAR | Status: DC | PRN

## 2020-10-14 MED ORDER — FENTANYL CITRATE (PF) 100 MCG/2ML IJ SOLN
INTRAMUSCULAR | Status: DC | PRN
  Administered 2020-10-14: 15 ug via INTRATHECAL

## 2020-10-14 MED ORDER — NALBUPHINE HCL 10 MG/ML IJ SOLN: 5.0000 mg | Freq: Once | INTRAMUSCULAR | Status: AC | PRN

## 2020-10-14 MED ORDER — SOD CITRATE-CITRIC ACID 500-334 MG/5ML PO SOLN
30.0000 mL | Freq: Once | ORAL | Status: AC
  Administered 2020-10-14: 30 mL via ORAL

## 2020-10-14 MED ORDER — DEXAMETHASONE SODIUM PHOSPHATE 4 MG/ML IJ SOLN
INTRAMUSCULAR | Status: AC
  Filled 2020-10-14: qty 2

## 2020-10-14 MED ORDER — KETOROLAC TROMETHAMINE 30 MG/ML IJ SOLN
30.0000 mg | Freq: Four times a day (QID) | INTRAMUSCULAR | Status: AC
  Administered 2020-10-14 – 2020-10-15 (×3): 30 mg via INTRAVENOUS
  Filled 2020-10-14 (×4): qty 1

## 2020-10-14 MED ORDER — OXYTOCIN-SODIUM CHLORIDE 30-0.9 UT/500ML-% IV SOLN
INTRAVENOUS | Status: AC
  Filled 2020-10-14: qty 500

## 2020-10-14 MED ORDER — KETOROLAC TROMETHAMINE 30 MG/ML IJ SOLN
INTRAMUSCULAR | Status: AC
  Filled 2020-10-14: qty 1

## 2020-10-14 MED ORDER — OXYCODONE HCL 5 MG/5ML PO SOLN: 5.0000 mg | Freq: Once | ORAL | Status: DC | PRN

## 2020-10-14 MED ORDER — OXYCODONE HCL 5 MG PO TABS
5.0000 mg | ORAL_TABLET | ORAL | Status: DC | PRN
  Administered 2020-10-14: 5 mg via ORAL
  Administered 2020-10-15: 10 mg via ORAL
  Administered 2020-10-15: 5 mg via ORAL
  Administered 2020-10-15 – 2020-10-17 (×8): 10 mg via ORAL
  Filled 2020-10-14: qty 1
  Filled 2020-10-14 (×2): qty 2
  Filled 2020-10-14: qty 1
  Filled 2020-10-14 (×5): qty 2
  Filled 2020-10-14 (×2): qty 1
  Filled 2020-10-14: qty 2

## 2020-10-14 SURGICAL SUPPLY — 32 items
BENZOIN TINCTURE PRP APPL 2/3 (GAUZE/BANDAGES/DRESSINGS) ×2 IMPLANT
CHLORAPREP W/TINT 26ML (MISCELLANEOUS) ×2 IMPLANT
CLAMP CORD UMBIL (MISCELLANEOUS) IMPLANT
CLOSURE STERI STRIP 1/2 X4 (GAUZE/BANDAGES/DRESSINGS) ×2 IMPLANT
CLOTH BEACON ORANGE TIMEOUT ST (SAFETY) ×2 IMPLANT
DRSG OPSITE POSTOP 4X10 (GAUZE/BANDAGES/DRESSINGS) ×2 IMPLANT
ELECT REM PT RETURN 9FT ADLT (ELECTROSURGICAL) ×2
ELECTRODE REM PT RTRN 9FT ADLT (ELECTROSURGICAL) ×1 IMPLANT
EXTRACTOR VACUUM KIWI (MISCELLANEOUS) IMPLANT
EXTRACTOR VACUUM M CUP 4 TUBE (SUCTIONS) IMPLANT
GLOVE BIOGEL PI IND STRL 7.0 (GLOVE) ×1 IMPLANT
GLOVE BIOGEL PI INDICATOR 7.0 (GLOVE) ×1
GLOVE ORTHO TXT STRL SZ7.5 (GLOVE) ×2 IMPLANT
GOWN STRL REUS W/TWL LRG LVL3 (GOWN DISPOSABLE) ×4 IMPLANT
KIT ABG SYR 3ML LUER SLIP (SYRINGE) IMPLANT
MAT PREVALON FULL STRYKER (MISCELLANEOUS) ×2 IMPLANT
NEEDLE HYPO 25X5/8 SAFETYGLIDE (NEEDLE) ×2 IMPLANT
NS IRRIG 1000ML POUR BTL (IV SOLUTION) ×2 IMPLANT
PACK C SECTION WH (CUSTOM PROCEDURE TRAY) ×2 IMPLANT
PAD OB MATERNITY 4.3X12.25 (PERSONAL CARE ITEMS) ×2 IMPLANT
PENCIL SMOKE EVAC W/HOLSTER (ELECTROSURGICAL) ×2 IMPLANT
RTRCTR C-SECT PINK 25CM LRG (MISCELLANEOUS) ×2 IMPLANT
SUT CHROMIC 1 CTX 36 (SUTURE) ×4 IMPLANT
SUT PLAIN 0 NONE (SUTURE) IMPLANT
SUT PLAIN 2 0 XLH (SUTURE) IMPLANT
SUT VIC AB 0 CT1 27 (SUTURE) ×4
SUT VIC AB 0 CT1 27XBRD ANBCTR (SUTURE) ×2 IMPLANT
SUT VIC AB 2-0 CT1 (SUTURE) ×2 IMPLANT
SUT VIC AB 4-0 KS 27 (SUTURE) IMPLANT
TOWEL OR 17X24 6PK STRL BLUE (TOWEL DISPOSABLE) ×2 IMPLANT
TRAY FOLEY W/BAG SLVR 14FR LF (SET/KITS/TRAYS/PACK) ×2 IMPLANT
WATER STERILE IRR 1000ML POUR (IV SOLUTION) ×2 IMPLANT

## 2020-10-14 NOTE — Progress Notes (Signed)
HD #2, [redacted]W[redacted]D, bleeding Still having dark bleeding with small clots, a bit worse this morning.  No regular ctx, +FM Afeb, VSS FHT- 140s, mod variability, some accels, periods of small variable decels Abd-soft, abdominoplasty scar, pfannenstiel scar inferior  IUP at [redacted]W[redacted]D, vaginal bleeding of ? Etiology that is persisting, previous c-section for repeat, A2GDM, CHTN.  Since bleeding is persisting and 37 weeks, I see no reason to delay delivery.  Discussed c-section procedure and risks, OR notified and scheduled for 1030

## 2020-10-14 NOTE — Op Note (Signed)
Preoperative diagnosis: Intrauterine pregnancy at 37 weeks, vaginal bleeding, previous c-section, Type II diabetes, CHTN Postoperative diagnosis: Same Procedure: Repeat low transverse cesarean section without extensions Surgeon: Lavina Hamman M.D. Anesthesia: Spinal  Findings: Patient had normal gravid anatomy and delivered a viable female infant with Apgars of 8 and 8, preliminary weight 9 lbs 2 oz Estimated blood loss: 600 cc Specimens: Placenta sent for routine pathology Complications: None  Procedure in detail: The patient was taken to the operating room and placed in the sitting position. The anesthesiologist instilled spinal anesthesia.  She was then placed in the dorsosupine position with left tilt. Abdomen was then prepped and draped in the usual sterile fashion, and a foley catheter was inserted. The level of her anesthesia was found to be adequate. Abdomen was entered via a standard Pfannenstiel incision through her previous scar, mild scar tissue. Once the peritoneal cavity was entered the Alexis disposable self-retaining retractor was placed and good visualization was achieved. A 4 cm transverse incision was then made in the lower uterine segment pushing the bladder inferior. Once the uterine cavity was entered the incision was extended digitally, clear amniotic fluid. The fetal vertex was grasped and delivered through the incision atraumatically with Kiwi vacuum assistance with one pull. Mouth and nares were suctioned. The remainder of the infant then delivered atraumatically. Cord was doubly clamped and cut after one minute and the infant handed to the awaiting pediatric team. Cord blood was obtained. The placenta delivered spontaneously. Uterus was wiped dry with clean lap pad and all clots and debris were removed. Uterine incision was inspected and found to be free of extensions. Uterine incision was closed in 1 layer with running locking #1 Chromic. Tubes and ovaries were inspected and  found to be normal. Uterine incision was inspected and found to be hemostatic. Bleeding from serosal edges was controlled with electrocautery. The Alexis retractor was removed. Subfascial space was irrigated and made hemostatic with electrocautery. Peritoneum was closed with 2-0 Vicryl.  Fascia was closed in running fashion starting at both ends and meeting in the middle with 0 Vicryl. Subcutaneous tissue was then irrigated and made hemostatic with electrocautery, then closed with running 2-0 plain gut. Skin was closed with running 4-0 Vicryl subcuticular suture followed by steri-strips and a sterile dressing. Patient tolerated the procedure well and was taken to the recovery in stable condition. Counts were correct x2, she received Ancef 3 g IV at the beginning of the procedure and she had PAS hose on throughout the procedure.

## 2020-10-14 NOTE — Progress Notes (Signed)
Having some pelvic and upper abdominal discomfort, does not think having ctx.  Has had bleeding most of the day, minimal on last wipe  Afeb, VSS FHT-150s, reactive, some small variables.  Had a period of fetal tachycardia to 170s, possible with some deeper variable decels, resolved with IV bolus and position change  Will continue observation, nothing pushing for delivery at this point.  I had initially written for carb modified diet and restart all of her insulin and metformin, but held meds and back to clear liquids when had fetal tachy last night.  Will see how things are in morning to see if can advance.  Continue Procardia for [facility]

## 2020-10-14 NOTE — Transfer of Care (Signed)
Immediate Anesthesia Transfer of Care Note  Patient: Pamela Duarte  Procedure(s) Performed: CESAREAN SECTION  Patient Location: PACU  Anesthesia Type:Spinal  Level of Consciousness: awake, alert  and oriented  Airway & Oxygen Therapy: Patient Spontanous Breathing  Post-op Assessment: Report given to RN and Post -op Vital signs reviewed and stable  Post vital signs: Reviewed and stable  Last Vitals:  Vitals Value Taken Time  BP 117/80 10/14/20 1200  Temp    Pulse 75 10/14/20 1201  Resp 12 10/14/20 1201  SpO2 96 % 10/14/20 1201  Vitals shown include unvalidated device data.  Last Pain:  Vitals:   10/14/20 0800  TempSrc:   PainSc: 0-No pain      Patients Stated Pain Goal: 0 (10/13/20 0231)  Complications: No notable events documented.

## 2020-10-14 NOTE — Anesthesia Postprocedure Evaluation (Signed)
Anesthesia Post Note  Patient: Pamela Duarte  Procedure(s) Performed: CESAREAN SECTION     Patient location during evaluation: PACU Anesthesia Type: Spinal Level of consciousness: awake and alert and oriented Pain management: pain level controlled Vital Signs Assessment: post-procedure vital signs reviewed and stable Respiratory status: spontaneous breathing, nonlabored ventilation and respiratory function stable Cardiovascular status: blood pressure returned to baseline and stable Postop Assessment: no headache, no backache, spinal receding and patient able to bend at knees Anesthetic complications: no   No notable events documented.  Last Vitals:  Vitals:   10/14/20 1315 10/14/20 1339  BP:  123/90  Pulse: 78 68  Resp: 17 16  Temp:  36.8 C  SpO2: 96% 100%    Last Pain:  Vitals:   10/14/20 1339  TempSrc: Oral  PainSc:    Pain Goal: Patients Stated Pain Goal: 0 (10/13/20 0231)              Epidural/Spinal Function Cutaneous sensation: Tingles (10/14/20 1300), Patient able to flex knees: Yes (10/14/20 1300), Patient able to lift hips off bed: No (10/14/20 1300), Back pain beyond tenderness at insertion site: No (10/14/20 1300), Progressively worsening motor and/or sensory loss: No (10/14/20 1300), Bowel and/or bladder incontinence post epidural: No (10/14/20 1300)  Lannie Fields

## 2020-10-14 NOTE — Lactation Note (Signed)
This note was copied from a baby's chart. Lactation Consultation Note  Patient Name: Pamela Duarte EHUDJ'S Date: 10/14/2020 Reason for consult: Follow-up assessment;Early term 37-38.6wks;Maternal endocrine disorder Age:36 hours P 2, per MBURN serum Blood sugar 33 , baby STS, baby Latched see doc flow sheets . Baby latched laid back position with better depth . Per mom comfortable .  Baby still feeding STS with warm blankets due low temp 97-4 ( per MBURN ) .  Mom aware she will be checked on buy LC later for feedings.  Maternal Data Has patient been taught Hand Expression?: Yes Does the patient have breastfeeding experience prior to this delivery?: Yes How long did the patient breastfeed?: per mom baby never really latched well / pumped / mom did not say how long / baby was bottle fed .  Feeding Mother's Current Feeding Choice: Breast Milk  LATCH Score Latch: Grasps breast easily, tongue down, lips flanged, rhythmical sucking.  Audible Swallowing: A few with stimulation  Type of Nipple: Everted at rest and after stimulation  Comfort (Breast/Nipple): Soft / non-tender  Hold (Positioning): Assistance needed to correctly position infant at breast and maintain latch.  LATCH Score: 8   Lactation Tools Discussed/Used    Interventions Interventions: Breast feeding basics reviewed;Assisted with latch;Skin to skin;Hand express;Breast compression;Adjust position;Support pillows;Education LC brochure provided.  Discharge    Consult Status Consult Status: Follow-up Date: 10/14/20 Follow-up type: In-patient    Pamela Duarte [name] 10/14/2020, 2:50 PM

## 2020-10-14 NOTE — Progress Notes (Signed)
Pt in PACU. Blood glucose registered 80 via pt's freestyle libre.

## 2020-10-14 NOTE — Anesthesia Preprocedure Evaluation (Addendum)
Anesthesia Evaluation  Patient identified by MRN, date of birth, ID band Patient awake    Reviewed: Allergy & Precautions, NPO status , Patient's Chart, lab work & pertinent test results, reviewed documented beta blocker date and time   Airway Mallampati: III  TM Distance: >3 FB Neck ROM: Full    Dental no notable dental hx.    Pulmonary neg pulmonary ROS,    Pulmonary exam normal breath sounds clear to auscultation       Cardiovascular hypertension, Pt. on medications and Pt. on home beta blockers Normal cardiovascular exam Rhythm:Regular Rate:Normal     Neuro/Psych negative neurological ROS  negative psych ROS   GI/Hepatic negative GI ROS, Neg liver ROS,   Endo/Other  diabetes, Well Controlled, Type 2, Insulin Dependent, Oral Hypoglycemic AgentsMorbid obesityBMI 45 a1c 6.5  Renal/GU negative Renal ROS  Female GU complaint     Musculoskeletal negative musculoskeletal ROS (+)   Abdominal (+) + obese,   Peds negative pediatric ROS (+)  Hematology negative hematology ROS (+) hct 33.4, plt 163   Anesthesia Other Findings   Reproductive/Obstetrics (+) Pregnancy Prior section 2021 w/ epidural                            Anesthesia Physical Anesthesia Plan  ASA: 3  Anesthesia Plan: Spinal   Post-op Pain Management:    Induction:   PONV Risk Score and Plan: 2 and Propofol infusion and TIVA  Airway Management Planned: Natural Airway and Nasal Cannula  Additional Equipment: None  Intra-op Plan:   Post-operative Plan:   Informed Consent: I have reviewed the patients History and Physical, chart, labs and discussed the procedure including the risks, benefits and alternatives for the proposed anesthesia with the patient or authorized representative who has indicated his/her understanding and acceptance.       Plan Discussed with: CRNA  Anesthesia Plan Comments:          Anesthesia Quick Evaluation

## 2020-10-14 NOTE — Anesthesia Procedure Notes (Signed)
Spinal  Patient location during procedure: OR Start time: 10/14/2020 10:28 AM End time: 10/14/2020 10:32 AM Reason for block: surgical anesthesia Staffing Performed: anesthesiologist  Anesthesiologist: Lannie Fields, DO Preanesthetic Checklist Completed: patient identified, IV checked, risks and benefits discussed, surgical consent, monitors and equipment checked, pre-op evaluation and timeout performed Spinal Block Patient position: sitting Prep: DuraPrep and site prepped and draped Patient monitoring: cardiac monitor, continuous pulse ox and blood pressure Approach: midline Location: L3-4 Injection technique: single-shot Needle Needle type: Pencan  Needle gauge: 24 G Needle length: 9 cm Assessment Sensory level: T6 Events: CSF return Additional Notes Functioning IV was confirmed and monitors were applied. Sterile prep and drape, including hand hygiene and sterile gloves were used. The patient was positioned and the spine was prepped. The skin was anesthetized with lidocaine.  Free flow of clear CSF was obtained prior to injecting local anesthetic into the CSF.  The spinal needle aspirated freely following injection.  The needle was carefully withdrawn.  The patient tolerated the procedure well.

## 2020-10-15 LAB — CBC
HCT: 26.5 % — ABNORMAL LOW (ref 36.0–46.0)
Hemoglobin: 8.5 g/dL — ABNORMAL LOW (ref 12.0–15.0)
MCH: 27.7 pg (ref 26.0–34.0)
MCHC: 32.1 g/dL (ref 30.0–36.0)
MCV: 86.3 fL (ref 80.0–100.0)
Platelets: 144 10*3/uL — ABNORMAL LOW (ref 150–400)
RBC: 3.07 MIL/uL — ABNORMAL LOW (ref 3.87–5.11)
RDW: 15.6 % — ABNORMAL HIGH (ref 11.5–15.5)
WBC: 11.1 10*3/uL — ABNORMAL HIGH (ref 4.0–10.5)
nRBC: 0 % (ref 0.0–0.2)

## 2020-10-15 LAB — RPR: RPR Ser Ql: NONREACTIVE

## 2020-10-15 MED ORDER — FERROUS SULFATE 325 (65 FE) MG PO TABS
325.0000 mg | ORAL_TABLET | Freq: Every day | ORAL | Status: DC
  Administered 2020-10-15 – 2020-10-17 (×3): 325 mg via ORAL
  Filled 2020-10-15 (×3): qty 1

## 2020-10-15 NOTE — Progress Notes (Addendum)
Subjective: Postpartum Day 1: Cesarean Delivery Patient reports some itching still from epidural, overall doing well. Incisional pain with walking, pain controlled with PO analgesia. Ambulating, voiding, tolerating PO. Minimal lochia.   Objective: Patient Vitals for the past 24 hrs:  BP Temp Temp src Pulse Resp SpO2  10/15/20 0630 127/79 97.7 F (36.5 C) Oral 97 18 99 %  10/15/20 0358 -- -- -- -- 18 99 %  10/15/20 0208 131/77 97.6 F (36.4 C) Oral 84 18 100 %  10/14/20 2335 -- -- -- -- 17 97 %  10/14/20 2230 133/74 (!) 97.5 F (36.4 C) Oral 84 18 100 %  10/14/20 2113 -- -- -- -- 18 98 %  10/14/20 2006 -- -- -- -- 17 99 %  10/14/20 1900 -- -- -- -- 18 --  10/14/20 1830 (!) 135/92 98.6 F (37 C) -- 75 18 96 %  10/14/20 1615 130/84 98 F (36.7 C) -- 77 20 96 %  10/14/20 1500 (!) 137/91 98.3 F (36.8 C) Oral 71 18 94 %  10/14/20 1339 123/90 98.2 F (36.8 C) Oral 68 16 100 %  10/14/20 1315 -- -- -- 78 17 96 %  10/14/20 1300 117/82 -- -- 76 15 96 %  10/14/20 1245 120/79 -- -- 68 18 95 %  10/14/20 1230 117/83 97.8 F (36.6 C) -- 73 17 94 %  10/14/20 1215 123/82 -- -- -- 20 95 %  10/14/20 1200 117/80 97.7 F (36.5 C) -- 72 20 95 %    Physical Exam:  General: alert, cooperative, and no distress Lochia: appropriate Uterine Fundus: firm Incision: healing well, some old serosanguinous drainage on right side of dressing, no active or bright red blood DVT Evaluation: No evidence of DVT seen on physical exam. Negative Homan's sign. No cords or calf tenderness.  Recent Labs    10/13/20 0359 10/15/20 0438  HGB 11.0* 8.5*  HCT 33.4* 26.5*    Assessment/Plan:  Pamela Duarte [id number] POD#1 sp primary cesarean at [redacted]w[redacted]d 1. PPC: continue routine postpartum care - PP Lovenox for VTE prophylaxis while inpatient 2. Acute blood loss anemia: asymptomatic, PO iron 3. Hypertension, chronic: Bps controlled with Procardia XL 30mg  daily 4. Type 2 DM: was on Metformin and Trulicity  prior to pregnancy, then metformin and insulin during pregnancy. She is currently on Metformin 1000mg  BID, monitoring blood sugars fasting 1 hr PP (fasting 89 and breakfast PP 111).  5. Dispo: anticipate d/c home POD 2 or 3  10/15/2020, 11:51 AM

## 2020-10-16 LAB — SURGICAL PATHOLOGY

## 2020-10-16 MED ORDER — ACETAMINOPHEN 325 MG PO TABS
650.0000 mg | ORAL_TABLET | Freq: Four times a day (QID) | ORAL | Status: DC | PRN
  Administered 2020-10-16: 650 mg via ORAL
  Filled 2020-10-16 (×2): qty 2

## 2020-10-16 MED ORDER — POLYETHYLENE GLYCOL 3350 17 G PO PACK: 17.0000 g | PACK | Freq: Every day | ORAL | Status: DC | PRN

## 2020-10-16 NOTE — Plan of Care (Signed)
  Problem: Education: Goal: Knowledge of condition will improve Outcome: Completed/Met   Problem: Activity: Goal: Will verbalize the importance of balancing activity with adequate rest periods Outcome: Completed/Met Goal: Ability to tolerate increased activity will improve Outcome: Completed/Met   Problem: Life Cycle: Goal: Chance of risk for complications during the postpartum period will decrease Outcome: Completed/Met   Problem: Clinical Measurements: Goal: Ability to maintain clinical measurements within normal limits will improve Outcome: Completed/Met Goal: Will remain free from infection Outcome: Completed/Met Goal: Diagnostic test results will improve Outcome: Completed/Met Goal: Respiratory complications will improve Outcome: Completed/Met Goal: Cardiovascular complication will be avoided Outcome: Completed/Met   Problem: Activity: Goal: Risk for activity intolerance will decrease Outcome: Completed/Met   Problem: Nutrition: Goal: Adequate nutrition will be maintained Outcome: Completed/Met   Problem: Coping: Goal: Level of anxiety will decrease Outcome: Completed/Met   Problem: Elimination: Goal: Will not experience complications related to bowel motility Outcome: Completed/Met

## 2020-10-16 NOTE — Lactation Note (Addendum)
This note was copied from a baby's chart. Lactation Consultation Note  Patient Name: Pamela Duarte IRCVE'L Date: 10/16/2020 Reason for consult: Mother's request;Follow-up assessment;Early term 37-38.6wks;Maternal endocrine disorder;Infant weight loss (Infant with hx low blood sugar ( hypoglycemia), LGA , -2% weight loss being supplemented with formula after every feeding.) Age:36 hours LC entered room, infant was cuing to breastfeeding.  Per mom, when using DEBP she is now pumping 10 mls , LC encouraged mom to pump every 3 hours for 15 minutes on initial setting.  Mom latched infant on her right breast using the football hold position, infant latched with depth and sustain the latch. Infant breastfeed for 20 minutes and afterwards was given 59 mls of formula by dad while mom used the DEBP.  Mom's current plan: 1- Mom will latch infant according to feeding cues, 8 to 12+ times within 24 hours, skin to skin. 2- Afterwards mom will offer her pumped EBM first and then formula. 3- Mom will continue to pump every 3 hours for 15 minutes on initial setting.  Maternal Data    Feeding Mother's Current Feeding Choice: Breast Milk and Formula Nipple Type: Slow - flow  LATCH Score Latch: Grasps breast easily, tongue down, lips flanged, rhythmical sucking.  Audible Swallowing: Spontaneous and intermittent  Type of Nipple: Everted at rest and after stimulation  Comfort (Breast/Nipple): Soft / non-tender  Assistance needed to help with latch   LATCH score: 9   Lactation Tools Discussed/Used Tools: Pump Breast pump type: Double-Electric Breast Pump Reason for Pumping: Per  mom she pumping every 4 hours but she will return to pumping every 3 hours.  Interventions Interventions: Breast compression;Adjust position;Support pillows;Position options;Skin to skin;Assisted with latch;Education;DEBP  Discharge Pump: DEBP  Consult Status Consult Status: Follow-up Date:  10/17/20 Follow-up type: In-patient    Danelle Earthly 10/16/2020, 6:13 PM

## 2020-10-16 NOTE — Progress Notes (Signed)
Subjective: Postpartum Day 2: Cesarean Delivery Patient reports pain controlled, tolerating po, no nausea/vomiting. Voiding and ambulating without difficulty  Objective: Vital signs in last 24 hours: Temp:  [98 F (36.7 C)-98.2 F (36.8 C)] 98.2 F (36.8 C) (10/04 0605) Pulse Rate:  [79-98] 79 (10/04 0605) Resp:  [18] 18 (10/04 0605) BP: (117-127)/(70-72) 117/70 (10/04 2878) Vitals:   10/15/20 0358 10/15/20 0630 10/15/20 1950 10/16/20 0605  BP:  127/79 127/72 117/70  Pulse:  97 98 79  Resp: 18 18 18 18   Temp:  97.7 F (36.5 C) 98 F (36.7 C) 98.2 F (36.8 C)  TempSrc:  Oral Oral Oral  SpO2: 99% 99%    Weight:      Height:         Physical Exam:  General: alert, cooperative, and appears stated age 35: appropriate Uterine Fundus: firm Incision: healing well, honeycomb soiled DVT Evaluation: No evidence of DVT seen on physical exam.  Recent Labs    10/15/20 0438  HGB 8.5*  HCT 26.5*    Assessment/Plan: Status post Cesarean section. Doing well postoperatively.  Continue current care. Will change honeycomb today d/t soilage BPs well controlled on Procardia 30mg  XL Type 2 DM: Metformin 1000mg  BID. Overall BS well controlled  12/15/20 10/16/2020, 1:30 PM

## 2020-10-17 MED ORDER — NIFEDIPINE ER 30 MG PO TB24: 30.0000 mg | ORAL_TABLET | Freq: Every day | ORAL | 1 refills | Status: DC

## 2020-10-17 MED ORDER — DOCUSATE SODIUM 100 MG PO CAPS: 100.0000 mg | ORAL_CAPSULE | Freq: Two times a day (BID) | ORAL | 1 refills | Status: DC

## 2020-10-17 MED ORDER — IBUPROFEN 800 MG PO TABS: 800.0000 mg | ORAL_TABLET | Freq: Three times a day (TID) | ORAL | 1 refills | Status: DC | PRN

## 2020-10-17 MED ORDER — OXYCODONE HCL 5 MG PO TABS: 5.0000 mg | ORAL_TABLET | Freq: Four times a day (QID) | ORAL | 0 refills | Status: DC | PRN

## 2020-10-17 NOTE — Lactation Note (Signed)
This note was copied from a baby's chart. Lactation Consultation Note  Patient Name: Pamela Duarte Date: 10/17/2020 Reason for consult: Follow-up assessment Age:36 hours  P2, Mother has primarily been formula feeding. Mother states she plans to breastfeed and formula feed. Reviewed engorgement care and monitoring voids/stools.   Feeding Mother's Current Feeding Choice: Breast Milk and Formula Nipple Type: Slow - flow   Lactation Tools Discussed/Used Breast pump type: Double-Electric Breast Pump  Discharge Discharge Education: Engorgement and breast care;Warning signs for feeding baby Pump: DEBP  Consult Status Consult Status: Complete Date: 10/17/20    Dahlia Byes [facility] 10/17/2020, 10:36 AM

## 2020-10-17 NOTE — Progress Notes (Signed)
Subjective: Postpartum Day 3: Cesarean Delivery Patient reports pain controlled, tolerating po, no nausea/vomiting. Voiding and ambulating without difficulty. No issues with incision  Objective: Vital signs in last 24 hours: Temp:  [97.8 F (36.6 C)-98.3 F (36.8 C)] 98.3 F (36.8 C) (10/05 0547) Pulse Rate:  [103-110] 105 (10/05 0547) Resp:  [18] 18 (10/05 0547) BP: (119-121)/(73-76) 121/74 (10/05 0547) SpO2:  [97 %-100 %] 98 % (10/05 0547) Vitals:   10/16/20 0605 10/16/20 1555 10/16/20 1949 10/17/20 0547  BP: 117/70 119/76 121/73 121/74  Pulse: 79 (!) 103 (!) 110 (!) 105  Resp: 18 18 18 18   Temp: 98.2 F (36.8 C) 97.8 F (36.6 C) 98.2 F (36.8 C) 98.3 F (36.8 C)  TempSrc: Oral Oral Oral Oral  SpO2:  100% 97% 98%  Weight:      Height:         Physical Exam:  General: alert, cooperative, and appears stated age 35: appropriate Uterine Fundus: firm Incision: healing well, honeycomb CDI DVT Evaluation: No evidence of DVT seen on physical exam.  Recent Labs    10/15/20 0438  HGB 8.5*  HCT 26.5*     Assessment/Plan: Status post Cesarean section. Doing well postoperatively.  Continue current care. DC home today - BP check in 2wk, continue meds as below, incision check in 2wks BPs well controlled on Procardia 30mg  XL Type 2 DM: Metformin 1000mg  BID. Overall BS well controlled, continue dosing at home  12/15/20 10/17/2020, 9:57 AM

## 2020-10-17 NOTE — Discharge Summary (Signed)
Postpartum Discharge Summary  Date of Service updated     Patient Name: Pamela Duarte DOB: 1985-02-21 MRN: [id number]  Date of admission: 10/13/2020 Delivery date:10/14/2020  Delivering provider: Jackelyn Knife, TODD  Date of discharge: 10/17/2020  Admitting diagnosis: Vaginal bleeding in pregnancy, third trimester [O46.93] S/P cesarean section [Z98.891] Intrauterine pregnancy: [redacted]w[redacted]d     Secondary diagnosis:  Active Problems:   Vaginal bleeding in pregnancy, third trimester   S/P cesarean section  Additional problems: T2DM, CHTN on meds    Discharge diagnosis: Term Pregnancy Delivered, CHTN, and Type 2 DM                                              Post partum procedures: none Augmentation: N/A Complications: None  Hospital course: Sceduled C/S   35 y.o. yo G2P2002 at [redacted]w[redacted]d was admitted to the hospital 10/13/2020 with vaginal bleeding in third trimester. Monitored, FHR tracings remained reactive however VB remained persistent. Patient already on the books for scheduled cesarean section with the following indication:Elective Repeat.Delivery details are as follows:  Membrane Rupture Time/Date: 10:59 AM ,10/14/2020   Delivery Method:C-Section, Low Transverse  Details of operation can be found in separate operative note.  Patient had an uncomplicated postpartum course.  She is ambulating, tolerating a regular diet, passing flatus, and urinating well. Patient is discharged home in stable condition on  10/17/20        Newborn Data: Birth date:10/14/2020  Birth time:11:02 AM  Gender:Female  Living status:Living  Apgars:8 ,8  Weight:4195 g      Physical exam  Vitals:   10/16/20 0605 10/16/20 1555 10/16/20 1949 10/17/20 0547  BP: 117/70 119/76 121/73 121/74  Pulse: 79 (!) 103 (!) 110 (!) 105  Resp: 18 18 18 18   Temp: 98.2 F (36.8 C) 97.8 F (36.6 C) 98.2 F (36.8 C) 98.3 F (36.8 C)  TempSrc: Oral Oral Oral Oral  SpO2:  100% 97% 98%  Weight:      Height:        General: alert, cooperative, and no distress Lochia: appropriate Uterine Fundus: firm Incision: Healing well with no significant drainage, No significant erythema, Dressing is clean, dry, and intact DVT Evaluation: No evidence of DVT seen on physical exam. Negative Homan's sign. No cords or calf tenderness. Labs: Lab Results  Component Value Date   WBC 11.1 (H) 10/15/2020   HGB 8.5 (L) 10/15/2020   HCT 26.5 (L) 10/15/2020   MCV 86.3 10/15/2020   PLT 144 (L) 10/15/2020   CMP Latest Ref Rng & Units 07/26/2019  Glucose 65 - 99 mg/dL 07/28/2019)  BUN 6 - 20 mg/dL 8  Creatinine 897(O - 4.78 mg/dL 4.12)  Sodium 8.20(S - 138 mmol/L 134  Potassium 3.5 - 5.2 mmol/L 4.4  Chloride 96 - 106 mmol/L 99  CO2 20 - 29 mmol/L 23  Calcium 8.7 - 10.2 mg/dL 9.6  Total Protein 6.0 - 8.5 g/dL 7.8  Total Bilirubin 0.0 - 1.2 mg/dL 0.2  Alkaline Phos 48 - 121 IU/L 71  AST 0 - 40 IU/L 12  ALT 0 - 32 IU/L 17   Edinburgh Score: Edinburgh Postnatal Depression Scale Screening Tool 10/16/2020  I have been able to laugh and see the funny side of things. 0  I have looked forward with enjoyment to things. 0  I have blamed myself unnecessarily when things went wrong.  0  I have been anxious or worried for no good reason. 0  I have felt scared or panicky for no good reason. 0  Things have been getting on top of me. 0  I have been so unhappy that I have had difficulty sleeping. 0  I have felt sad or miserable. 0  I have been so unhappy that I have been crying. 0  The thought of harming myself has occurred to me. 0  Edinburgh Postnatal Depression Scale Total 0      After visit meds:  Allergies as of 10/17/2020   No Known Allergies      Medication List     STOP taking these medications    amLODipine 5 MG tablet Commonly known as: NORVASC   folic acid 800 MCG tablet Commonly known as: FOLVITE   insulin lispro 100 UNIT/ML injection Commonly known as: HUMALOG   insulin NPH-regular Human (70-30) 100  UNIT/ML injection   metoprolol succinate 25 MG 24 hr tablet Commonly known as: Toprol XL   Trulicity 0.75 MG/0.5ML Sopn Generic drug: Dulaglutide       TAKE these medications    docusate sodium 100 MG capsule Commonly known as: Colace Take 1 capsule (100 mg total) by mouth 2 (two) times daily.   FreeStyle Libre 14 Day Sensor Misc 1 application by Does not apply route daily.   glucose blood test strip Commonly known as: FREESTYLE TEST STRIPS Use as instructed Dx code: E11.9-Dispense Freestyle Libre test strips   ibuprofen 800 MG tablet Commonly known as: ADVIL Take 1 tablet (800 mg total) by mouth every 8 (eight) hours as needed.   metFORMIN 500 MG 24 hr tablet Commonly known as: GLUCOPHAGE-XR Take 2 tablets by mouth twice daily   NIFEdipine 30 MG 24 hr tablet Commonly known as: ADALAT CC Take 1 tablet (30 mg total) by mouth daily. Start taking on: October 18, 2020   oxyCODONE 5 MG immediate release tablet Commonly known as: Oxy IR/ROXICODONE Take 1 tablet (5 mg total) by mouth every 6 (six) hours as needed for severe pain. What changed:  how much to take when to take this   prenatal multivitamin Tabs tablet Take 1 tablet by mouth daily at 12 noon.         Discharge home in stable condition Infant Feeding: Bottle and Breast Infant Disposition:home with mother Discharge instruction: per After Visit Summary and Postpartum booklet. Activity: Advance as tolerated. Pelvic rest for 6 weeks.  Diet: carb modified diet Anticipated Birth Control: IUD Postpartum Appointment:6 weeks Additional Postpartum F/U: Incision check 2wks and BP check 1 week     10/17/2020 Carlisle Cater, MD

## 2020-10-24 ENCOUNTER — Telehealth (HOSPITAL_COMMUNITY): Payer: Self-pay | Admitting: *Deleted

## 2020-10-24 NOTE — Telephone Encounter (Signed)
Attempted Hospital Discharge Follow-Up Call.  Left voice mail requesting that patient return RN's phone call.  

## 2020-11-01 ENCOUNTER — Inpatient Hospital Stay (HOSPITAL_COMMUNITY): Admit: 2020-11-01 | Payer: 59 | Admitting: Obstetrics and Gynecology

## 2020-11-01 ENCOUNTER — Encounter (HOSPITAL_COMMUNITY): Payer: Self-pay

## 2020-11-01 SURGERY — Surgical Case
Anesthesia: Spinal

## 2020-12-05 ENCOUNTER — Ambulatory Visit (INDEPENDENT_AMBULATORY_CARE_PROVIDER_SITE_OTHER): Payer: 59 | Admitting: Emergency Medicine

## 2020-12-05 ENCOUNTER — Encounter: Payer: Self-pay | Admitting: Emergency Medicine

## 2020-12-05 ENCOUNTER — Other Ambulatory Visit: Payer: Self-pay

## 2020-12-05 VITALS — BP 128/80 | HR 95 | Temp 98.6°F | Ht 67.5 in | Wt 262.0 lb

## 2020-12-05 DIAGNOSIS — Z862 Personal history of diseases of the blood and blood-forming organs and certain disorders involving the immune mechanism: Secondary | ICD-10-CM | POA: Diagnosis not present

## 2020-12-05 DIAGNOSIS — Z6841 Body Mass Index (BMI) 40.0 and over, adult: Secondary | ICD-10-CM | POA: Diagnosis not present

## 2020-12-05 DIAGNOSIS — E1159 Type 2 diabetes mellitus with other circulatory complications: Secondary | ICD-10-CM

## 2020-12-05 DIAGNOSIS — I152 Hypertension secondary to endocrine disorders: Secondary | ICD-10-CM

## 2020-12-05 LAB — CBC WITH DIFFERENTIAL/PLATELET
Basophils Absolute: 0 10*3/uL (ref 0.0–0.1)
Basophils Relative: 0.4 % (ref 0.0–3.0)
Eosinophils Absolute: 0.1 10*3/uL (ref 0.0–0.7)
Eosinophils Relative: 1.5 % (ref 0.0–5.0)
HCT: 33.4 % — ABNORMAL LOW (ref 36.0–46.0)
Hemoglobin: 10.6 g/dL — ABNORMAL LOW (ref 12.0–15.0)
Lymphocytes Relative: 35.6 % (ref 12.0–46.0)
Lymphs Abs: 2.4 10*3/uL (ref 0.7–4.0)
MCHC: 31.7 g/dL (ref 30.0–36.0)
MCV: 79.6 fl (ref 78.0–100.0)
Monocytes Absolute: 0.6 10*3/uL (ref 0.1–1.0)
Monocytes Relative: 8.1 % (ref 3.0–12.0)
Neutro Abs: 3.7 10*3/uL (ref 1.4–7.7)
Neutrophils Relative %: 54.4 % (ref 43.0–77.0)
Platelets: 433 10*3/uL — ABNORMAL HIGH (ref 150.0–400.0)
RBC: 4.19 Mil/uL (ref 3.87–5.11)
RDW: 16.4 % — ABNORMAL HIGH (ref 11.5–15.5)
WBC: 6.8 10*3/uL (ref 4.0–10.5)

## 2020-12-05 LAB — LIPID PANEL
Cholesterol: 153 mg/dL (ref 0–200)
HDL: 56.5 mg/dL (ref >39.00)
LDL Cholesterol: 80 mg/dL (ref 0–99)
NonHDL: 96.35
Total CHOL/HDL Ratio: 3
Triglycerides: 84 mg/dL (ref 0.0–149.0)
VLDL: 16.8 mg/dL (ref 0.0–40.0)

## 2020-12-05 LAB — COMPREHENSIVE METABOLIC PANEL
ALT: 23 U/L (ref 0–35)
AST: 20 U/L (ref 0–37)
Albumin: 4.3 g/dL (ref 3.5–5.2)
Alkaline Phosphatase: 92 U/L (ref 39–117)
BUN: 8 mg/dL (ref 6–23)
CO2: 25 mEq/L (ref 19–32)
Calcium: 9.1 mg/dL (ref 8.4–10.5)
Chloride: 101 mEq/L (ref 96–112)
Creatinine, Ser: 0.59 mg/dL (ref 0.40–1.20)
GFR: 115.6 mL/min (ref >60.00)
Glucose, Bld: 166 mg/dL — ABNORMAL HIGH (ref 70–99)
Potassium: 3.9 mEq/L (ref 3.5–5.1)
Sodium: 134 mEq/L — ABNORMAL LOW (ref 135–145)
Total Bilirubin: 0.4 mg/dL (ref 0.2–1.2)
Total Protein: 7.9 g/dL (ref 6.0–8.3)

## 2020-12-05 LAB — POCT GLYCOSYLATED HEMOGLOBIN (HGB A1C): Hemoglobin A1C: 7.4 % — AB (ref 4.0–5.6)

## 2020-12-05 LAB — TSH: TSH: 1.49 u[IU]/mL (ref 0.35–5.50)

## 2020-12-05 MED ORDER — FREESTYLE LIBRE 14 DAY SENSOR MISC: 1.0000 "application " | Freq: Every day | 4 refills | Status: DC

## 2020-12-05 MED ORDER — OZEMPIC (0.25 OR 0.5 MG/DOSE) 2 MG/1.5ML ~~LOC~~ SOPN: 0.5000 mg | PEN_INJECTOR | SUBCUTANEOUS | 7 refills | Status: DC

## 2020-12-05 MED ORDER — AMLODIPINE BESYLATE 10 MG PO TABS: 10.0000 mg | ORAL_TABLET | Freq: Every day | ORAL | 3 refills | Status: DC

## 2020-12-05 NOTE — Assessment & Plan Note (Signed)
Well-controlled hypertension. Will switch from nifedipine to amlodipine 10 mg daily. Uncontrolled diabetes with hemoglobin A1c at 7.4. Continue metformin 1000 mg twice a day and start weekly Ozempic 0.5 mg. Diet and nutrition discussed. Follow-up in 3 months.

## 2020-12-05 NOTE — Patient Instructions (Signed)

## 2020-12-05 NOTE — Progress Notes (Signed)
Pamela Duarte 35 y.o.   Chief Complaint  Patient presents with   Office Visit    Discuss diabetes    HISTORY OF PRESENT ILLNESS: This is a 35 y.o. female with history of diabetes.  Just delivered baby 6 weeks ago. During the pregnancy and she was taking metformin and insulin.  Did not take Trulicity. Requesting Ozempic.  Concerned about her weight.  Inquiring about bariatric surgery. Was also changed from amlodipine to nifedipine during pregnancy. States she also had anemia during her pregnancy. No other complaints or medical concerns today. States she is not breast-feeding due to insufficient milk production.  HPI   Prior to Admission medications   Medication Sig Start Date End Date Taking? Authorizing Provider  Continuous Blood Gluc Sensor (FREESTYLE LIBRE 14 DAY SENSOR) MISC 1 application by Does not apply route daily. 09/23/19  Yes Arasely Akkerman, Ines Bloomer, MD  glucose blood (FREESTYLE TEST STRIPS) test strip Use as instructed Dx code: E11.9-Dispense Freestyle Libre test strips 07/26/17  Yes Forrest Moron, MD  metFORMIN (GLUCOPHAGE-XR) 500 MG 24 hr tablet Take 2 tablets by mouth twice daily 08/10/20  Yes Rashawna Scoles, Ines Bloomer, MD  NIFEdipine (ADALAT CC) 30 MG 24 hr tablet Take 1 tablet (30 mg total) by mouth daily. 10/18/20  Yes Shivaji, Melida Quitter, MD  oxyCODONE (OXY IR/ROXICODONE) 5 MG immediate release tablet Take 1 tablet (5 mg total) by mouth every 6 (six) hours as needed for severe pain. 10/17/20  Yes Shivaji, Melida Quitter, MD    No Known Allergies  Patient Active Problem List   Diagnosis Date Noted   S/P cesarean section 10/14/2020   Vaginal bleeding in pregnancy, third trimester 10/13/2020   Atrial tachycardia ([place]) 10/19/2019   Hypertension associated with diabetes ([place]) 07/26/2019   Preeclampsia in postpartum period 04/12/2019   Status post primary low transverse cesarean section 03/30/2019   Normal labor and delivery 03/29/2019   Newly diagnosed diabetes ([place])  06/26/2017   Infertility counseling 06/26/2017   Class 2 obesity due to excess calories without serious comorbidity with body mass index (BMI) of 39.0 to 39.9 in adult 06/26/2017    Past Medical History:  Diagnosis Date   Diabetes mellitus without complication (HCC)    insulin type 2   PCOS (polycystic ovarian syndrome)     Past Surgical History:  Procedure Laterality Date   ABDOMINOPLASTY  2015   CESAREAN SECTION N/A 03/30/2019   Procedure: CESAREAN SECTION;  Surgeon: Paula Compton, MD;  Location: [facility];  Service: Obstetrics;  Laterality: N/A;   CESAREAN SECTION N/A 10/14/2020   Procedure: CESAREAN SECTION;  Surgeon: Cheri Fowler, MD;  Location: [facility] OR[facility];  Service: Obstetrics;  Laterality: N/A;   COSMETIC SURGERY      Social History   Socioeconomic History   Marital status: Married    Spouse name: Not on file   Number of children: 1   Years of education: Not on file   Highest education level: Not on file  Occupational History   Not on file  Tobacco Use   Smoking status: Never   Smokeless tobacco: Never  Vaping Use   Vaping Use: Never used  Substance and Sexual Activity   Alcohol use: No   Drug use: No   Sexual activity: Not Currently  Other Topics Concern   Not on file  Social History Narrative   Not on file   Social Determinants of Health   Financial Resource Strain: Not on file  Food Insecurity: Not on file  Transportation Needs: Not on file  Physical Activity: Not on file  Stress: Not on file  Social Connections: Not on file  Intimate Partner Violence: Not on file    Family History  Problem Relation Age of Onset   Diabetes Maternal Grandmother    Hypertension Maternal Grandmother    Diabetes Paternal Grandfather    Hypertension Paternal Grandfather    Cancer Paternal Grandfather    Hypertension Paternal Grandmother    Diabetes Paternal Grandmother    Hypertension Mother      Review of Systems  Constitutional: Negative.  Negative for  chills and fever.  HENT: Negative.  Negative for congestion and sore throat.   Respiratory: Negative.  Negative for cough and shortness of breath.   Cardiovascular: Negative.  Negative for chest pain and palpitations.  Gastrointestinal: Negative.  Negative for abdominal pain, diarrhea, nausea and vomiting.  Genitourinary: Negative.  Negative for dysuria and hematuria.  Musculoskeletal: Negative.   Skin: Negative.  Negative for rash.  Neurological: Negative.  Negative for dizziness and headaches.  All other systems reviewed and are negative.  Today's Vitals   12/05/20 1107  BP: 128/80  Pulse: 95  Temp: 98.6 F (37 C)  TempSrc: Oral  SpO2: 99%  Weight: 262 lb (118.8 kg)  Height: 5' 7.5" (1.715 m)   Body mass index is 40.43 kg/m.  Physical Exam Vitals reviewed.  Constitutional:      Appearance: Normal appearance.  HENT:     Head: Normocephalic.  Eyes:     Extraocular Movements: Extraocular movements intact.     Pupils: Pupils are equal, round, and reactive to light.  Cardiovascular:     Rate and Rhythm: Normal rate and regular rhythm.     Pulses: Normal pulses.     Heart sounds: Normal heart sounds.  Pulmonary:     Effort: Pulmonary effort is normal.     Breath sounds: Normal breath sounds.  Musculoskeletal:        General: Normal range of motion.     Cervical back: No tenderness.  Skin:    General: Skin is warm and dry.     Capillary Refill: Capillary refill takes less than 2 seconds.  Neurological:     General: No focal deficit present.     Mental Status: She is alert and oriented to person, place, and time.  Psychiatric:        Mood and Affect: Mood normal.        Behavior: Behavior normal.    Results for orders placed or performed in visit on 12/05/20 (from the past 24 hour(s))  POCT HgB A1C     Status: Abnormal   Collection Time: 12/05/20 11:25 AM  Result Value Ref Range   Hemoglobin A1C 7.4 (A) 4.0 - 5.6 %   HbA1c POC (<> result, manual entry)      HbA1c, POC (prediabetic range)     HbA1c, POC (controlled diabetic range)      ASSESSMENT & PLAN: A total of 47 minutes was spent with the patient and counseling/coordination of care regarding preparing for this visit, review of most recent office visit notes, review of most recent blood work results including today's hemoglobin A1c, review of all medications and changes made including starting amlodipine and weekly Ozempic, cardiovascular risks associated with both hypertension and diabetes, education on nutrition and importance of weight loss and exercise, prognosis, documentation and need for follow-up in 3 months.  Problem List Items Addressed This Visit       Cardiovascular  and Mediastinum   Hypertension associated with diabetes ([place]) - Primary    Well-controlled hypertension. Will switch from nifedipine to amlodipine 10 mg daily. Uncontrolled diabetes with hemoglobin A1c at 7.4. Continue metformin 1000 mg twice a day and start weekly Ozempic 0.5 mg. Diet and nutrition discussed. Follow-up in 3 months.      Relevant Medications   amLODipine (NORVASC) 10 MG tablet   Continuous Blood Gluc Sensor (FREESTYLE LIBRE 14 DAY SENSOR) MISC   Semaglutide,0.25 or 0.5MG /DOS, (OZEMPIC, 0.25 OR 0.5 MG/DOSE,) 2 MG/1.5ML SOPN   Other Relevant Orders   POCT HgB A1C (Completed)   CBC with Differential/Platelet   Comprehensive metabolic panel   Lipid panel   TSH   Other Visit Diagnoses     Body mass index (BMI) of 40.1-44.9 in adult [facility])       History of anemia       Relevant Orders   CBC with Differential/Platelet        Patient Instructions  Diabetes Mellitus and Nutrition, Adult When you have diabetes, or diabetes mellitus, it is very important to have healthy eating habits because your blood sugar (glucose) levels are greatly affected by what you eat and drink. Eating healthy foods in the right amounts, at about the same times every day, can help you: Manage your blood  glucose. Lower your risk of heart disease. Improve your blood pressure. Reach or maintain a healthy weight. What can affect my meal plan? Every person with diabetes is different, and each person has different needs for a meal plan. Your health care provider may recommend that you work with a dietitian to make a meal plan that is best for you. Your meal plan may vary depending on factors such as: The calories you need. The medicines you take. Your weight. Your blood glucose, blood pressure, and cholesterol levels. Your activity level. Other health conditions you have, such as heart or kidney disease. How do carbohydrates affect me? Carbohydrates, also called carbs, affect your blood glucose level more than any other type of food. Eating carbs raises the amount of glucose in your blood. It is important to know how many carbs you can safely have in each meal. This is different for every person. Your dietitian can help you calculate how many carbs you should have at each meal and for each snack. How does alcohol affect me? Alcohol can cause a decrease in blood glucose (hypoglycemia), especially if you use insulin or take certain diabetes medicines by mouth. Hypoglycemia can be a life-threatening condition. Symptoms of hypoglycemia, such as sleepiness, dizziness, and confusion, are similar to symptoms of having too much alcohol. Do not drink alcohol if: Your health care provider tells you not to drink. You are pregnant, may be pregnant, or are planning to become pregnant. If you drink alcohol: Limit how much you have to: 0-1 drink a day for women. 0-2 drinks a day for men. Know how much alcohol is in your drink. In the [place] one drink equals one 12 oz bottle of beer (355 mL), one 5 oz glass of wine (148 mL), or one 1 oz glass of hard liquor (44 mL). Keep yourself hydrated with water, diet soda, or unsweetened iced tea. Keep in mind that regular soda, juice, and other mixers may contain a lot of  sugar and must be counted as carbs. What are tips for following this plan? Reading food labels Start by checking the serving size on the Nutrition Facts label of packaged foods and drinks. The  number of calories and the amount of carbs, fats, and other nutrients listed on the label are based on one serving of the item. Many items contain more than one serving per package. Check the total grams (g) of carbs in one serving. Check the number of grams of saturated fats and trans fats in one serving. Choose foods that have a low amount or none of these fats. Check the number of milligrams (mg) of salt (sodium) in one serving. Most people should limit total sodium intake to less than 2,300 mg per day. Always check the nutrition information of foods labeled as "low-fat" or "nonfat." These foods may be higher in added sugar or refined carbs and should be avoided. Talk to your dietitian to identify your daily goals for nutrients listed on the label. Shopping Avoid buying canned, pre-made, or processed foods. These foods tend to be high in fat, sodium, and added sugar. Shop around the outside edge of the grocery store. This is where you will most often find fresh fruits and vegetables, bulk grains, fresh meats, and fresh dairy products. Cooking Use low-heat cooking methods, such as baking, instead of high-heat cooking methods, such as deep frying. Cook using healthy oils, such as olive, canola, or sunflower oil. Avoid cooking with butter, cream, or high-fat meats. Meal planning Eat meals and snacks regularly, preferably at the same times every day. Avoid going long periods of time without eating. Eat foods that are high in fiber, such as fresh fruits, vegetables, beans, and whole grains. Eat 4-6 oz (112-168 g) of lean protein each day, such as lean meat, chicken, fish, eggs, or tofu. One ounce (oz) (28 g) of lean protein is equal to: 1 oz (28 g) of meat, chicken, or fish. 1 egg.  cup (62 g) of  tofu. Eat some foods each day that contain healthy fats, such as avocado, nuts, seeds, and fish. What foods should I eat? Fruits Berries. Apples. Oranges. Peaches. Apricots. Plums. Grapes. Mangoes. Papayas. Pomegranates. Kiwi. Cherries. Vegetables Leafy greens, including lettuce, spinach, kale, chard, collard greens, mustard greens, and cabbage. Beets. Cauliflower. Broccoli. Carrots. Green beans. Tomatoes. Peppers. Onions. Cucumbers. Brussels sprouts. Grains Whole grains, such as whole-wheat or whole-grain bread, crackers, tortillas, cereal, and pasta. Unsweetened oatmeal. Quinoa. Brown or wild rice. Meats and other proteins Seafood. Poultry without skin. Lean cuts of poultry and beef. Tofu. Nuts. Seeds. Dairy Low-fat or fat-free dairy products such as milk, yogurt, and cheese. The items listed above may not be a complete list of foods and beverages you can eat and drink. Contact a dietitian for more information. What foods should I avoid? Fruits Fruits canned with syrup. Vegetables Canned vegetables. Frozen vegetables with butter or cream sauce. Grains Refined white flour and flour products such as bread, pasta, snack foods, and cereals. Avoid all processed foods. Meats and other proteins Fatty cuts of meat. Poultry with skin. Breaded or fried meats. Processed meat. Avoid saturated fats. Dairy Full-fat yogurt, cheese, or milk. Beverages Sweetened drinks, such as soda or iced tea. The items listed above may not be a complete list of foods and beverages you should avoid. Contact a dietitian for more information. Questions to ask a health care provider Do I need to meet with a certified diabetes care and education specialist? Do I need to meet with a dietitian? What number can I call if I have questions? When are the best times to check my blood glucose? Where to find more information: American Diabetes Association: diabetes.org Academy of Nutrition and  Dietetics:  eatright.[employer] of Diabetes and Digestive and Kidney Diseases: [employer] Association of Diabetes Care & Education Specialists: diabeteseducator.org Summary It is important to have healthy eating habits because your blood sugar (glucose) levels are greatly affected by what you eat and drink. It is important to use alcohol carefully. A healthy meal plan will help you manage your blood glucose and lower your risk of heart disease. Your health care provider may recommend that you work with a dietitian to make a meal plan that is best for you. This information is not intended to replace advice given to you by your health care provider. Make sure you discuss any questions you have with your health care provider. Document Revised: 08/03/2019 Document Reviewed: 08/03/2019 Elsevier Patient Education  2022 [place], [place] [facility]

## 2021-01-29 DIAGNOSIS — Z30431 Encounter for routine checking of intrauterine contraceptive device: Secondary | ICD-10-CM | POA: Diagnosis not present

## 2021-02-04 ENCOUNTER — Encounter: Payer: Self-pay | Admitting: Emergency Medicine

## 2021-02-04 ENCOUNTER — Other Ambulatory Visit: Payer: Self-pay | Admitting: Emergency Medicine

## 2021-02-04 DIAGNOSIS — I152 Hypertension secondary to endocrine disorders: Secondary | ICD-10-CM

## 2021-02-04 DIAGNOSIS — E1159 Type 2 diabetes mellitus with other circulatory complications: Secondary | ICD-10-CM

## 2021-02-04 MED ORDER — RYBELSUS 7 MG PO TABS: 7.0000 mg | ORAL_TABLET | Freq: Every day | ORAL | 3 refills | Status: DC

## 2021-02-04 NOTE — Telephone Encounter (Signed)
Advise to switch to Rybelsus 7 mg daily.  New prescription sent to pharmacy of record today.  Thanks.

## 2021-02-19 ENCOUNTER — Other Ambulatory Visit: Payer: Self-pay

## 2021-02-19 ENCOUNTER — Encounter: Payer: Self-pay | Admitting: Emergency Medicine

## 2021-02-19 ENCOUNTER — Ambulatory Visit (INDEPENDENT_AMBULATORY_CARE_PROVIDER_SITE_OTHER): Payer: BC Managed Care – PPO | Admitting: Emergency Medicine

## 2021-02-19 VITALS — BP 122/68 | HR 100 | Ht 67.0 in | Wt 255.0 lb

## 2021-02-19 DIAGNOSIS — E1159 Type 2 diabetes mellitus with other circulatory complications: Secondary | ICD-10-CM

## 2021-02-19 DIAGNOSIS — I152 Hypertension secondary to endocrine disorders: Secondary | ICD-10-CM

## 2021-02-19 LAB — POCT GLYCOSYLATED HEMOGLOBIN (HGB A1C): Hemoglobin A1C: 6.7 % — AB (ref 4.0–5.6)

## 2021-02-19 MED ORDER — SEMAGLUTIDE (1 MG/DOSE) 4 MG/3ML ~~LOC~~ SOPN
1.0000 mg | PEN_INJECTOR | SUBCUTANEOUS | 7 refills | Status: DC
Start: 2021-02-19 — End: 2021-03-21

## 2021-02-19 NOTE — Patient Instructions (Signed)

## 2021-02-19 NOTE — Assessment & Plan Note (Signed)
Well-controlled hypertension.  Continue amlodipine 10 mg daily. Well-controlled diabetes with hemoglobin A1c of 6.7. Will increase Ozempic to 1 mg weekly and continue metformin 500 mg twice a day. Diet and nutrition discussed. Follow-up in 6 months.

## 2021-02-19 NOTE — Progress Notes (Signed)
Pamela Duarte 36 y.o.   Chief Complaint  Patient presents with   Diabetes   Hypertension    HISTORY OF PRESENT ILLNESS: This is a 36 y.o. female with history of diabetes and hypertension here for follow-up. Doing well.  Has no complaints or medical concerns.  Wants to increase dose of Ozempic to 1 mg weekly.  Diabetes Pertinent negatives for hypoglycemia include no dizziness or headaches. Pertinent negatives for diabetes include no chest pain.  Hypertension Pertinent negatives include no chest pain, headaches, palpitations or shortness of breath.   Prior to Admission medications   Medication Sig Start Date End Date Taking? Authorizing Provider  amLODipine (NORVASC) 10 MG tablet Take 1 tablet (10 mg total) by mouth daily. 12/05/20  Yes Annelies Coyt, Ines Bloomer, MD  Continuous Blood Gluc Sensor (FREESTYLE LIBRE 14 DAY SENSOR) MISC 1 application by Does not apply route daily. 12/05/20  Yes Jearlean Demauro, Ines Bloomer, MD  glucose blood (FREESTYLE TEST STRIPS) test strip Use as instructed Dx code: E11.9-Dispense Freestyle Libre test strips 07/26/17  Yes Forrest Moron, MD  metFORMIN (GLUCOPHAGE-XR) 500 MG 24 hr tablet Take 2 tablets by mouth twice daily 08/10/20  Yes Jamaia Brum, Ines Bloomer, MD  oxyCODONE (OXY IR/ROXICODONE) 5 MG immediate release tablet Take 1 tablet (5 mg total) by mouth every 6 (six) hours as needed for severe pain. 10/17/20  Yes Shivaji, Melida Quitter, MD  Semaglutide, 1 MG/DOSE, 4 MG/3ML SOPN Inject 1 mg as directed once a week. 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 2   PCOS (polycystic ovarian syndrome)     Past Surgical History:  Procedure Laterality Date   ABDOMINOPLASTY  2015   CESAREAN SECTION N/A 03/30/2019   Procedure: CESAREAN SECTION;  Surgeon: Paula Compton, MD;  Location: [facility];  Service: Obstetrics;  Laterality: N/A;   CESAREAN SECTION N/A 10/14/2020   Procedure: CESAREAN SECTION;  Surgeon: Cheri Fowler, MD;  Location: [facility];  Service: Obstetrics;  Laterality: N/A;   COSMETIC SURGERY      Social History   Socioeconomic History   Marital status: Married    Spouse name: Not on file   Number of children: 1   Years of education: Not on file   Highest education level: Not on file  Occupational History   Not on file  Tobacco Use   Smoking status: Never   Smokeless tobacco: Never  Vaping Use   Vaping Use: Never used  Substance and Sexual Activity   Alcohol use: No   Drug use: No   Sexual activity: Not Currently  Other Topics Concern   Not on file  Social History Narrative   Not on file   Social Determinants of Health   Financial Resource Strain: Not on file  Food Insecurity: Not on file  Transportation Needs: Not on file  Physical Activity: Not on file  Stress: Not on file  Social Connections: Not on file  Intimate Partner Violence: Not on file    Family  History  Problem Relation Age of Onset   Diabetes Maternal Grandmother    Hypertension Maternal Grandmother    Diabetes Paternal Grandfather    Hypertension Paternal Grandfather    Cancer Paternal Grandfather    Hypertension Paternal Grandmother    Diabetes Paternal Grandmother    Hypertension Mother      Review of Systems  Constitutional: Negative.  Negative for chills and fever.  HENT: Negative.  Negative for congestion and sore throat.   Respiratory: Negative.  Negative for cough and shortness of breath.   Cardiovascular: Negative.  Negative for chest pain and  palpitations.  Gastrointestinal: Negative.  Negative for abdominal pain, diarrhea, nausea and vomiting.  Genitourinary: Negative.  Negative for dysuria and hematuria.  Musculoskeletal: Negative.   Skin: Negative.  Negative for rash.  Neurological: Negative.  Negative for dizziness and headaches.  All other systems reviewed and are negative.  Today's Vitals   02/19/21 0844  BP: 122/68  Pulse: 100  SpO2: 94%  Weight: 255 lb (115.7 kg)  Height: 5\' 7"  (1.702 m)   Body mass index is 39.94 kg/m. Wt Readings from Last 3 Encounters:  02/19/21 255 lb (115.7 kg)  12/05/20 262 lb (118.8 kg)  10/13/20 294 lb 8.6 oz (133.6 kg)    Physical Exam Vitals reviewed.  Constitutional:      Appearance: Normal appearance.  HENT:     Head: Normocephalic.  Eyes:     Extraocular Movements: Extraocular movements intact.     Conjunctiva/sclera: Conjunctivae normal.     Pupils: Pupils are equal, round, and reactive to light.  Cardiovascular:     Rate and Rhythm: Normal rate and regular rhythm.     Pulses: Normal pulses.     Heart sounds: Normal heart sounds.  Pulmonary:     Effort: Pulmonary effort is normal.     Breath sounds: Normal breath sounds.  Musculoskeletal:        General: Normal range of motion.     Cervical back: Normal range of motion and neck supple.  Skin:    General: Skin is warm and dry.     Capillary Refill: Capillary refill takes less than 2 seconds.  Neurological:     General: No focal deficit present.     Mental Status: She is alert and oriented to person, place, and time.  Psychiatric:        Mood and Affect: Mood normal.        Behavior: Behavior normal.    Results for orders placed or performed in visit on 02/19/21 (from the past 24 hour(s))  POCT glycosylated hemoglobin (Hb A1C)     Status: Abnormal   Collection Time: 02/19/21  8:52 AM  Result Value Ref Range   Hemoglobin A1C 6.7 (A) 4.0 - 5.6 %   HbA1c POC (<> result, manual entry)     HbA1c, POC (prediabetic  range)     HbA1c, POC (controlled diabetic range)      ASSESSMENT & PLAN: Problem List Items Addressed This Visit       Cardiovascular and Mediastinum   Hypertension associated with diabetes ([place]) - Primary    Well-controlled hypertension.  Continue amlodipine 10 mg daily. Well-controlled diabetes with hemoglobin A1c of 6.7. Will increase Ozempic to 1 mg weekly and continue metformin 500 mg twice a day. Diet and nutrition discussed. Follow-up in 6 months.      Relevant Medications   Semaglutide, 1 MG/DOSE, 4 MG/3ML SOPN   Other Relevant Orders   POCT glycosylated hemoglobin (  Hb A1C) (Completed)   Patient Instructions  Diabetes Mellitus and Nutrition, Adult When you have diabetes, or diabetes mellitus, it is very important to have healthy eating habits because your blood sugar (glucose) levels are greatly affected by what you eat and drink. Eating healthy foods in the right amounts, at about the same times every day, can help you: Manage your blood glucose. Lower your risk of heart disease. Improve your blood pressure. Reach or maintain a healthy weight. What can affect my meal plan? Every person with diabetes is different, and each person has different needs for a meal plan. Your health care provider may recommend that you work with a dietitian to make a meal plan that is best for you. Your meal plan may vary depending on factors such as: The calories you need. The medicines you take. Your weight. Your blood glucose, blood pressure, and cholesterol levels. Your activity level. Other health conditions you have, such as heart or kidney disease. How do carbohydrates affect me? Carbohydrates, also called carbs, affect your blood glucose level more than any other type of food. Eating carbs raises the amount of glucose in your blood. It is important to know how many carbs you can safely have in each meal. This is different for every person. Your dietitian can help you calculate how  many carbs you should have at each meal and for each snack. How does alcohol affect me? Alcohol can cause a decrease in blood glucose (hypoglycemia), especially if you use insulin or take certain diabetes medicines by mouth. Hypoglycemia can be a life-threatening condition. Symptoms of hypoglycemia, such as sleepiness, dizziness, and confusion, are similar to symptoms of having too much alcohol. Do not drink alcohol if: Your health care provider tells you not to drink. You are pregnant, may be pregnant, or are planning to become pregnant. If you drink alcohol: Limit how much you have to: 0-1 drink a day for women. 0-2 drinks a day for men. Know how much alcohol is in your drink. In the [place] one drink equals one 12 oz bottle of beer (355 mL), one 5 oz glass of wine (148 mL), or one 1 oz glass of hard liquor (44 mL). Keep yourself hydrated with water, diet soda, or unsweetened iced tea. Keep in mind that regular soda, juice, and other mixers may contain a lot of sugar and must be counted as carbs. What are tips for following this plan? Reading food labels Start by checking the serving size on the Nutrition Facts label of packaged foods and drinks. The number of calories and the amount of carbs, fats, and other nutrients listed on the label are based on one serving of the item. Many items contain more than one serving per package. Check the total grams (g) of carbs in one serving. Check the number of grams of saturated fats and trans fats in one serving. Choose foods that have a low amount or none of these fats. Check the number of milligrams (mg) of salt (sodium) in one serving. Most people should limit total sodium intake to less than 2,300 mg per day. Always check the nutrition information of foods labeled as "low-fat" or "nonfat." These foods may be higher in added sugar or refined carbs and should be avoided. Talk to your dietitian to identify your daily goals for nutrients listed on the  label. Shopping Avoid buying canned, pre-made, or processed foods. These foods tend to be high in fat, sodium, and added sugar. Shop around the outside edge  of the grocery store. This is where you will most often find fresh fruits and vegetables, bulk grains, fresh meats, and fresh dairy products. Cooking Use low-heat cooking methods, such as baking, instead of high-heat cooking methods, such as deep frying. Cook using healthy oils, such as olive, canola, or sunflower oil. Avoid cooking with butter, cream, or high-fat meats. Meal planning Eat meals and snacks regularly, preferably at the same times every day. Avoid going long periods of time without eating. Eat foods that are high in fiber, such as fresh fruits, vegetables, beans, and whole grains. Eat 4-6 oz (112-168 g) of lean protein each day, such as lean meat, chicken, fish, eggs, or tofu. One ounce (oz) (28 g) of lean protein is equal to: 1 oz (28 g) of meat, chicken, or fish. 1 egg.  cup (62 g) of tofu. Eat some foods each day that contain healthy fats, such as avocado, nuts, seeds, and fish. What foods should I eat? Fruits Berries. Apples. Oranges. Peaches. Apricots. Plums. Grapes. Mangoes. Papayas. Pomegranates. Kiwi. Cherries. Vegetables Leafy greens, including lettuce, spinach, kale, chard, collard greens, mustard greens, and cabbage. Beets. Cauliflower. Broccoli. Carrots. Green beans. Tomatoes. Peppers. Onions. Cucumbers. Brussels sprouts. Grains Whole grains, such as whole-wheat or whole-grain bread, crackers, tortillas, cereal, and pasta. Unsweetened oatmeal. Quinoa. Brown or wild rice. Meats and other proteins Seafood. Poultry without skin. Lean cuts of poultry and beef. Tofu. Nuts. Seeds. Dairy Low-fat or fat-free dairy products such as milk, yogurt, and cheese. The items listed above may not be a complete list of foods and beverages you can eat and drink. Contact a dietitian for more information. What foods should I  avoid? Fruits Fruits canned with syrup. Vegetables Canned vegetables. Frozen vegetables with butter or cream sauce. Grains Refined white flour and flour products such as bread, pasta, snack foods, and cereals. Avoid all processed foods. Meats and other proteins Fatty cuts of meat. Poultry with skin. Breaded or fried meats. Processed meat. Avoid saturated fats. Dairy Full-fat yogurt, cheese, or milk. Beverages Sweetened drinks, such as soda or iced tea. The items listed above may not be a complete list of foods and beverages you should avoid. Contact a dietitian for more information. Questions to ask a health care provider Do I need to meet with a certified diabetes care and education specialist? Do I need to meet with a dietitian? What number can I call if I have questions? When are the best times to check my blood glucose? Where to find more information: American Diabetes Association: diabetes.org Academy of Nutrition and Dietetics: eatright.[employer] of Diabetes and Digestive and Kidney Diseases: AmenCredit.is Association of Diabetes Care & Education Specialists: diabeteseducator.org Summary It is important to have healthy eating habits because your blood sugar (glucose) levels are greatly affected by what you eat and drink. It is important to use alcohol carefully. A healthy meal plan will help you manage your blood glucose and lower your risk of heart disease. Your health care provider may recommend that you work with a dietitian to make a meal plan that is best for you. This information is not intended to replace advice given to you by your health care provider. Make sure you discuss any questions you have with your health care provider. Document Revised: 08/03/2019 Document Reviewed: 08/03/2019 Elsevier Patient Education  2022 [place], MD [facility]

## 2021-03-11 ENCOUNTER — Other Ambulatory Visit: Payer: Self-pay | Admitting: Emergency Medicine

## 2021-03-11 DIAGNOSIS — E118 Type 2 diabetes mellitus with unspecified complications: Secondary | ICD-10-CM

## 2021-03-11 MED ORDER — METFORMIN HCL ER 500 MG PO TB24: 1000.0000 mg | ORAL_TABLET | Freq: Two times a day (BID) | ORAL | 0 refills | Status: DC

## 2021-03-20 ENCOUNTER — Encounter: Payer: Self-pay | Admitting: Emergency Medicine

## 2021-03-21 ENCOUNTER — Other Ambulatory Visit: Payer: Self-pay | Admitting: *Deleted

## 2021-03-21 ENCOUNTER — Telehealth: Payer: Self-pay

## 2021-03-21 DIAGNOSIS — I152 Hypertension secondary to endocrine disorders: Secondary | ICD-10-CM

## 2021-03-21 DIAGNOSIS — E118 Type 2 diabetes mellitus with unspecified complications: Secondary | ICD-10-CM

## 2021-03-21 MED ORDER — SEMAGLUTIDE (1 MG/DOSE) 4 MG/3ML ~~LOC~~ SOPN: 1.0000 mg | PEN_INJECTOR | SUBCUTANEOUS | 7 refills | Status: DC

## 2021-03-21 MED ORDER — METFORMIN HCL ER 500 MG PO TB24: 1000.0000 mg | ORAL_TABLET | Freq: Two times a day (BID) | ORAL | 0 refills | Status: DC

## 2021-03-21 MED ORDER — AMLODIPINE BESYLATE 10 MG PO TABS: 10.0000 mg | ORAL_TABLET | Freq: Every day | ORAL | 3 refills | Status: DC

## 2021-03-21 NOTE — Telephone Encounter (Signed)
Pt is requesting that a new Rx be sent to [facility]. ?amLODipine (NORVASC) 10 MG tablet ?Semaglutide, 1 MG/DOSE, 4 MG/3ML SOPN ?metFORMIN (GLUCOPHAGE-XR) 500 MG 24 hr tablet ? ?Pharmacy: ?[facility] ) - [place], [place] - [address] ? ?LOV 02/19/21 ?

## 2021-03-22 NOTE — Telephone Encounter (Signed)
Medication was refilled 03/21/21 ?

## 2021-05-16 DIAGNOSIS — L918 Other hypertrophic disorders of the skin: Secondary | ICD-10-CM | POA: Diagnosis not present

## 2021-05-16 DIAGNOSIS — L821 Other seborrheic keratosis: Secondary | ICD-10-CM | POA: Diagnosis not present

## 2021-05-27 ENCOUNTER — Other Ambulatory Visit: Payer: Self-pay | Admitting: Emergency Medicine

## 2021-05-27 DIAGNOSIS — E118 Type 2 diabetes mellitus with unspecified complications: Secondary | ICD-10-CM

## 2021-08-08 ENCOUNTER — Telehealth: Payer: Self-pay | Admitting: Cardiovascular Disease

## 2021-08-08 DIAGNOSIS — I471 Supraventricular tachycardia: Secondary | ICD-10-CM

## 2021-08-08 NOTE — Telephone Encounter (Signed)
Called patient left message on personal voice mail to call back to discuss.

## 2021-08-08 NOTE — Telephone Encounter (Signed)
Received a call from patient.She stated she started having fast heart beat again.She restarted Metoprolol 25 mg daily on 7/20-pulse 120 to 137. She increased to 50 mg daily on 7/21-pulse 88 to 92 and stopped taking Amlodipine.B/P today 112/72.Stated she would like a prescription for Metoprolol Succ 50 mg daily.Appointment scheduled with Dr.O'Neal 8/18 at 8:30 am.Advised I will send message to Dr.O'Neal for a order.

## 2021-08-08 NOTE — Telephone Encounter (Signed)
Message sent to Dr.O'Neal patient will be out of Metoprolol.Is it ok to send in a prescription for Metoprolol Succ 50 mg daily until her appointment.

## 2021-08-08 NOTE — Telephone Encounter (Signed)
Pt sent this via myChart to the scheduling pool:     Hello I was seen two years ago for high heart rate and now it is back. I had a baby last year again (baby number 2 on 10/14/20 via c section again) now I spontaneously have a high heart rate caught by my apple watch again up to 150-170 at rest. I could also feel the palpitaTions. I resumed my metoprolol prescribed at my last visit.  I also added aspirin. I have had no significant medication changes in the last 6 months other than those listed on my current med list. no supplements except multivitamins or recreational drug use. Not an anxious person at all. No chest pain. Only the palpitations that were more present before resuming my metoprolol. Because of all this I would like to see Dr. Flora Lipps again for evaluation.     Good Morning Pamela Duarte,  Can you tell me a little more about what is going on with your heart rate?      Appointment Request From: Pamela Duarte  With Provider: Reatha Harps, MD [facility]]  Preferred Date Range: Any  Preferred Times: Any Time  Reason for visit: Office Visit  Comments: Heart rate

## 2021-08-09 NOTE — Telephone Encounter (Signed)
Left message for pt of dr o'neal's recommendations to continue to hold the metoprolol.

## 2021-08-22 ENCOUNTER — Telehealth: Payer: Self-pay | Admitting: Cardiovascular Disease

## 2021-08-22 ENCOUNTER — Ambulatory Visit (INDEPENDENT_AMBULATORY_CARE_PROVIDER_SITE_OTHER): Payer: BC Managed Care – PPO

## 2021-08-22 ENCOUNTER — Other Ambulatory Visit: Payer: Self-pay

## 2021-08-22 ENCOUNTER — Encounter: Payer: Self-pay | Admitting: Emergency Medicine

## 2021-08-22 ENCOUNTER — Ambulatory Visit: Payer: BC Managed Care – PPO | Admitting: Emergency Medicine

## 2021-08-22 ENCOUNTER — Ambulatory Visit (INDEPENDENT_AMBULATORY_CARE_PROVIDER_SITE_OTHER): Payer: BC Managed Care – PPO | Admitting: Emergency Medicine

## 2021-08-22 ENCOUNTER — Encounter: Payer: Self-pay | Admitting: Cardiovascular Disease

## 2021-08-22 VITALS — BP 130/70 | HR 105 | Temp 98.3°F | Ht 67.0 in | Wt 256.5 lb

## 2021-08-22 DIAGNOSIS — F4321 Adjustment disorder with depressed mood: Secondary | ICD-10-CM | POA: Diagnosis not present

## 2021-08-22 DIAGNOSIS — E1159 Type 2 diabetes mellitus with other circulatory complications: Secondary | ICD-10-CM

## 2021-08-22 DIAGNOSIS — I10 Essential (primary) hypertension: Secondary | ICD-10-CM | POA: Diagnosis not present

## 2021-08-22 DIAGNOSIS — E118 Type 2 diabetes mellitus with unspecified complications: Secondary | ICD-10-CM | POA: Diagnosis not present

## 2021-08-22 DIAGNOSIS — I471 Supraventricular tachycardia: Secondary | ICD-10-CM

## 2021-08-22 LAB — POCT GLYCOSYLATED HEMOGLOBIN (HGB A1C): Hemoglobin A1C: 6.7 % — AB (ref 4.0–5.6)

## 2021-08-22 MED ORDER — ESCITALOPRAM OXALATE 10 MG PO TABS: 10.0000 mg | ORAL_TABLET | Freq: Every day | ORAL | 1 refills | Status: DC

## 2021-08-22 NOTE — Assessment & Plan Note (Signed)
Well-controlled hypertension with normal blood pressure readings at home. Continue amlodipine 10 mg daily. Well-controlled diabetes with hemoglobin A1c of 6.7 Lab Results  Component Value Date   HGBA1C 6.7 (A) 08/22/2021  Continue metformin 2 g daily and semaglutide 2 mg weekly. Eating better but not loosing weight. Diet and nutrition discussed. Will continue present management.  Greggory Keen was discussed.

## 2021-08-22 NOTE — Assessment & Plan Note (Signed)
Acute and actively affecting quality of life. Not interested at present time to follow-up with behavioral health. Will start Lexapro 10 mg daily and follow-up in 4 weeks. Depression triggers addressed and management options discussed.

## 2021-08-22 NOTE — Telephone Encounter (Signed)
LMTCB on personal cell phone regarding monitor. Left message in MyChart.

## 2021-08-22 NOTE — Telephone Encounter (Signed)
Patient returned call regarding monitor. Transferred to RN.

## 2021-08-22 NOTE — Patient Instructions (Signed)
Major Depressive Disorder, Adult Major depressive disorder is a mental health condition. This disorder affects feelings. It can also affect the body. Symptoms of this condition last most of the day, almost every day, for 2 weeks. This disorder can affect: Relationships. Daily activities, such as work and school. Activities that you normally like to do. What are the causes? The cause of this condition is not known. The disorder is likely caused by a mix of things, including: Your personality, such as being a shy person. Your behavior, or how you act toward others. Your thoughts and feelings. Too much alcohol or drugs. How you react to stress. Health and mental problems that you have had for a long time. Things that hurt you in the past (trauma). Big changes in your life, such as divorce. What increases the risk? The following factors may make you more likely to develop this condition: Having family members with depression. Being a woman. Problems in the family. Low levels of some brain chemicals. Things that caused you pain as a child, especially if you lost a parent or were abused. A lot of stress in your life, such as from: Living without basic needs of life, such as food and shelter. Being treated poorly because of race, sex, or religion (discrimination). Health and mental problems that you have had for a long time. What are the signs or symptoms? The main symptoms of this condition are: Being sad all the time. Being grouchy all the time. Loss of interest in things and activities. Other symptoms include: Sleeping too much or too little. Eating too much or too little. Gaining or losing weight, without knowing why. Feeling tired or having low energy. Being restless and weak. Feeling hopeless, worthless, or guilty. Trouble thinking clearly or making decisions. Thoughts of hurting yourself or others, or thoughts of ending your life. Spending a lot of time alone. Inability to  complete common tasks of daily life. If you have very bad MDD, you may: Believe things that are not true. Hear, see, taste, or feel things that are not there. Have mild depression that lasts for at least 2 years. Feel very sad and hopeless. Have trouble speaking or moving. How is this treated? This condition may be treated with: Talk therapy. This teaches you to know bad thoughts, feelings, and actions and how to change them. This can also help you to communicate with others. This can be done with members of your family. Medicines. These can be used to treat worry (anxiety), depression, or low levels of chemicals in the brain. Lifestyle changes. You may need to: Limit alcohol use. Limit drug use. Get regular exercise. Get plenty of sleep. Make healthy eating choices. Spend more time outdoors. Brain stimulation. This treatment excites the brain. This is done when symptoms are very bad or have not gotten better with other treatments. Follow these instructions at home: Activity Get regular exercise as told. Spend time outdoors as told. Make time to do the things you enjoy. Find ways to deal with stress. Try to: Meditate. Do deep breathing. Spend time in nature. Keep a journal. Return to your normal activities as told by your doctor. Ask your doctor what activities are safe for you. Alcohol and drug use If you drink alcohol: Limit how much you use to: 0-1 drink a day for women. 0-2 drinks a day for men. Be aware of how much alcohol is in your drink. In the [place] one drink equals one 12 oz bottle of beer (355 mL),   one 5 oz glass of wine (148 mL), or one 1 oz glass of hard liquor (44 mL). Talk to your doctor about: Alcohol use. Alcohol can affect some medicines. Any drug use. General instructions  Take over-the-counter and prescription medicines and herbal preparations only as told by your doctor. Eat a healthy diet. Get a lot of sleep. Think about joining a support group.  Your doctor may be able to suggest one. Keep all follow-up visits as told by your doctor. This is important. Where to find more information: National Alliance on Mental Illness: www.nami.org [place]. National Institute of Mental Health: www.nimh.nih.gov American Psychiatric Association: www.psychiatry.org/patients-families/ Contact a doctor if: Your symptoms get worse. You get new symptoms. Get help right away if: You hurt yourself. You have serious thoughts about hurting yourself or others. You see, hear, taste, smell, or feel things that are not there. If you ever feel like you may hurt yourself or others, or have thoughts about taking your own life, get help right away. Go to your nearest emergency department or: Call your local emergency services (911 in the [place] Call a suicide crisis helpline, such as the National Suicide Prevention Lifeline at 1-800-273-8255 or 988 in the [place] This is open 24 hours a day in the [place] Text the Crisis Text Line at 741741 (in the [place] Summary Major depressive disorder is a mental health condition. This disorder affects feelings. Symptoms of this condition last most of the day, almost every day, for 2 weeks. The symptoms of this disorder can cause problems with relationships and with daily activities. There are treatments and support for people who get this disorder. You may need more than one type of treatment. Get help right away if you have serious thoughts about hurting yourself or others. This information is not intended to replace advice given to you by your health care provider. Make sure you discuss any questions you have with your health care provider. Document Revised: 07/25/2020 Document Reviewed: 12/11/2018 Elsevier Patient Education  2023 Elsevier Inc.  

## 2021-08-22 NOTE — Telephone Encounter (Signed)
Patient stated she was ordered a monitor on 08/08/21. The last order I saw for a heart monitor was on 09/07/2019. She said she never received it. She has an appointment with Dr. Flora Lipps on 8/18. Do you want to see her before putting in a new monitor order?

## 2021-08-22 NOTE — Progress Notes (Signed)
Lab Results  Component Value Date   HGBA1C 6.7 (A) 02/19/2021   Wt Readings from Last 3 Encounters:  08/22/21 256 lb 8 oz (116.3 kg)  02/19/21 255 lb (115.7 kg)  12/05/20 262 lb (118.8 kg)   Pamela Duarte 36 y.o.   Chief Complaint  Patient presents with   Diabetes   Follow-up    Pt concerns about depression, wants to talk about medication options    Medication Management    Pt wants to switch to the Dexcom G6     HISTORY OF PRESENT ILLNESS: This is a 36 y.o. female with history of diabetes on semaglutide and metformin. Recently increased dose to 2 mg of semaglutide weekly.  Doing well but not losing weight. Also complaining of depression for the past several weeks.  Marital problems. Married mother of 2 children. Works as a Licensed conveyancer.  Diabetes Pertinent negatives for hypoglycemia include no dizziness or headaches. Pertinent negatives for diabetes include no chest pain.     Prior to Admission medications   Medication Sig Start Date End Date Taking? Authorizing Provider  amLODipine (NORVASC) 10 MG tablet Take 1 tablet (10 mg total) by mouth daily. 03/21/21  Yes Shawnta Zimbelman, Eilleen Kempf, MD  Continuous Blood Gluc Sensor (FREESTYLE LIBRE 14 DAY SENSOR) MISC 1 application by Does not apply route daily. 12/05/20  Yes Rosaisela Jamroz, Eilleen Kempf, MD  escitalopram (LEXAPRO) 10 MG tablet Take 1 tablet (10 mg total) by mouth daily. 08/22/21 11/20/21 Yes Naraly Fritcher, Eilleen Kempf, MD  glucose blood (FREESTYLE TEST STRIPS) test strip Use as instructed Dx code: E11.9-Dispense Freestyle Libre test strips 07/26/17  Yes Stallings, Zoe A, MD  metFORMIN (GLUCOPHAGE-XR) 500 MG 24 hr tablet TAKE 2 TABLETS BY MOUTH TWICE  DAILY 05/27/21  Yes Aamina Skiff, Eilleen Kempf, MD  oxyCODONE (OXY IR/ROXICODONE) 5 MG immediate release tablet Take 1 tablet (5 mg total) by mouth every 6 (six) hours as needed for severe pain. 10/17/20  Yes Shivaji, Valerie Roys, MD  Semaglutide, 1 MG/DOSE, 4 MG/3ML SOPN Inject 1 mg as  directed once a week. 03/21/21  Yes Georgina Quint, MD    No Known Allergies  Patient Active Problem List   Diagnosis Date Noted   S/P cesarean section 10/14/2020   Atrial tachycardia (HCC) 10/19/2019   Hypertension associated with diabetes (HCC) 07/26/2019   Newly diagnosed diabetes (HCC) 06/26/2017   Class 2 obesity due to excess calories without serious comorbidity with body mass index (BMI) of 39.0 to 39.9 in adult 06/26/2017    Past Medical History:  Diagnosis Date   Diabetes mellitus without complication (HCC)    insulin type 2   PCOS (polycystic ovarian syndrome)     Past Surgical History:  Procedure Laterality Date   ABDOMINOPLASTY  2015   CESAREAN SECTION N/A 03/30/2019   Procedure: CESAREAN SECTION;  Surgeon: Huel Cote, MD;  Location: [facility];  Service: Obstetrics;  Laterality: N/A;   CESAREAN SECTION N/A 10/14/2020   Procedure: CESAREAN SECTION;  Surgeon: Lavina Hamman, MD;  Location: [facility];  Service: Obstetrics;  Laterality: N/A;   COSMETIC SURGERY      Social History   Socioeconomic History   Marital status: Married    Spouse name: Not on file   Number of children: 1   Years of education: Not on file   Highest education level: Not on file  Occupational History   Not on file  Tobacco Use   Smoking status: Never   Smokeless tobacco: Never  Vaping Use  Vaping Use: Never used  Substance and Sexual Activity   Alcohol use: No   Drug use: No   Sexual activity: Not Currently  Other Topics Concern   Not on file  Social History Narrative   Not on file   Social Determinants of Health   Financial Resource Strain: Not on file  Food Insecurity: Not on file  Transportation Needs: Not on file  Physical Activity: Not on file  Stress: Not on file  Social Connections: Not on file  Intimate Partner Violence: Not on file    Family History  Problem Relation Age of Onset   Diabetes Maternal Grandmother    Hypertension Maternal Grandmother     Diabetes Paternal Grandfather    Hypertension Paternal Grandfather    Cancer Paternal Grandfather    Hypertension Paternal Grandmother    Diabetes Paternal Grandmother    Hypertension Mother      Review of Systems  Constitutional: Negative.  Negative for chills and fever.  HENT: Negative.  Negative for congestion and sore throat.   Respiratory: Negative.  Negative for cough and shortness of breath.   Cardiovascular: Negative.  Negative for chest pain and palpitations.  Gastrointestinal:  Negative for abdominal pain, diarrhea, nausea and vomiting.  Genitourinary: Negative.   Skin: Negative.  Negative for rash.  Neurological: Negative.  Negative for dizziness and headaches.  Psychiatric/Behavioral:  Positive for depression.   All other systems reviewed and are negative.  Today's Vitals   08/22/21 1039 08/22/21 1047 08/22/21 1221  BP: (!) 146/100 (!) 144/98 130/70  Pulse: (!) 105    Temp: 98.3 F (36.8 C)    TempSrc: Oral    SpO2: 95%    Weight: 256 lb 8 oz (116.3 kg)    Height: 5\' 7"  (1.702 m)     Body mass index is 40.17 kg/m.   Physical Exam Vitals reviewed.  Constitutional:      Appearance: Normal appearance.  HENT:     Head: Normocephalic.  Eyes:     Extraocular Movements: Extraocular movements intact.  Cardiovascular:     Rate and Rhythm: Normal rate.  Pulmonary:     Effort: Pulmonary effort is normal.  Skin:    General: Skin is warm and dry.     Capillary Refill: Capillary refill takes less than 2 seconds.  Neurological:     Mental Status: She is alert and oriented to person, place, and time.  Psychiatric:        Mood and Affect: Mood normal.        Behavior: Behavior normal.     Results for orders placed or performed in visit on 08/22/21 (from the past 24 hour(s))  POCT HgB A1C     Status: Abnormal   Collection Time: 08/22/21 11:05 AM  Result Value Ref Range   Hemoglobin A1C 6.7 (A) 4.0 - 5.6 %   HbA1c POC (<> result, manual entry)     HbA1c,  POC (prediabetic range)     HbA1c, POC (controlled diabetic range)      ASSESSMENT & PLAN: A total of 45 minutes was spent with the patient and counseling/coordination of care regarding preparing for this visit, review of most recent office visit notes, review of multiple chronic medical problems and their management, review of most recent blood work results and today's interpretation of hemoglobin A1c, review of all medications, diagnosis and treatment of depression, management options, prognosis, documentation, and need for follow-up in 4 weeks.  Problem List Items Addressed This Visit  Cardiovascular and Mediastinum   Hypertension associated with diabetes (HCC)    Well-controlled hypertension with normal blood pressure readings at home. Continue amlodipine 10 mg daily. Well-controlled diabetes with hemoglobin A1c of 6.7 Lab Results  Component Value Date   HGBA1C 6.7 (A) 08/22/2021  Continue metformin 2 g daily and semaglutide 2 mg weekly. Eating better but not loosing weight. Diet and nutrition discussed. Will continue present management.  Greggory Keen was discussed.           Other   Situational depression - Primary    Acute and actively affecting quality of life. Not interested at present time to follow-up with behavioral health. Will start Lexapro 10 mg daily and follow-up in 4 weeks. Depression triggers addressed and management options discussed.      Relevant Medications   escitalopram (LEXAPRO) 10 MG tablet   Other Visit Diagnoses     Type 2 diabetes mellitus with complication, without long-term current use of insulin (HCC)       Relevant Orders   POCT HgB A1C (Completed)      Patient Instructions  Major Depressive Disorder, Adult Major depressive disorder is a mental health condition. This disorder affects feelings. It can also affect the body. Symptoms of this condition last most of the day, almost every day, for 2 weeks. This disorder can  affect: Relationships. Daily activities, such as work and school. Activities that you normally like to do. What are the causes? The cause of this condition is not known. The disorder is likely caused by a mix of things, including: Your personality, such as being a shy person. Your behavior, or how you act toward others. Your thoughts and feelings. Too much alcohol or drugs. How you react to stress. Health and mental problems that you have had for a long time. Things that hurt you in the past (trauma). Big changes in your life, such as divorce. What increases the risk? The following factors may make you more likely to develop this condition: Having family members with depression. Being a woman. Problems in the family. Low levels of some brain chemicals. Things that caused you pain as a child, especially if you lost a parent or were abused. A lot of stress in your life, such as from: Living without basic needs of life, such as food and shelter. Being treated poorly because of race, sex, or religion (discrimination). Health and mental problems that you have had for a long time. What are the signs or symptoms? The main symptoms of this condition are: Being sad all the time. Being grouchy all the time. Loss of interest in things and activities. Other symptoms include: Sleeping too much or too little. Eating too much or too little. Gaining or losing weight, without knowing why. Feeling tired or having low energy. Being restless and weak. Feeling hopeless, worthless, or guilty. Trouble thinking clearly or making decisions. Thoughts of hurting yourself or others, or thoughts of ending your life. Spending a lot of time alone. Inability to complete common tasks of daily life. If you have very bad MDD, you may: Believe things that are not true. Hear, see, taste, or feel things that are not there. Have mild depression that lasts for at least 2 years. Feel very sad and hopeless. Have  trouble speaking or moving. How is this treated? This condition may be treated with: Talk therapy. This teaches you to know bad thoughts, feelings, and actions and how to change them. This can also help you to communicate with others.  This can be done with members of your family. Medicines. These can be used to treat worry (anxiety), depression, or low levels of chemicals in the brain. Lifestyle changes. You may need to: Limit alcohol use. Limit drug use. Get regular exercise. Get plenty of sleep. Make healthy eating choices. Spend more time outdoors. Brain stimulation. This treatment excites the brain. This is done when symptoms are very bad or have not gotten better with other treatments. Follow these instructions at home: Activity Get regular exercise as told. Spend time outdoors as told. Make time to do the things you enjoy. Find ways to deal with stress. Try to: Meditate. Do deep breathing. Spend time in nature. Keep a journal. Return to your normal activities as told by your doctor. Ask your doctor what activities are safe for you. Alcohol and drug use If you drink alcohol: Limit how much you use to: 0-1 drink a day for women. 0-2 drinks a day for men. Be aware of how much alcohol is in your drink. In the [place] one drink equals one 12 oz bottle of beer (355 mL), one 5 oz glass of wine (148 mL), or one 1 oz glass of hard liquor (44 mL). Talk to your doctor about: Alcohol use. Alcohol can affect some medicines. Any drug use. General instructions  Take over-the-counter and prescription medicines and herbal preparations only as told by your doctor. Eat a healthy diet. Get a lot of sleep. Think about joining a support group. Your doctor may be able to suggest one. Keep all follow-up visits as told by your doctor. This is important. Where to find more information: [employer] on Mental Illness: www.nami.org [place] [employer] of Mental Health:  http://www.maynard.net/ American Psychiatric Association: www.psychiatry.org/patients-families/ Contact a doctor if: Your symptoms get worse. You get new symptoms. Get help right away if: You hurt yourself. You have serious thoughts about hurting yourself or others. You see, hear, taste, smell, or feel things that are not there. If you ever feel like you may hurt yourself or others, or have thoughts about taking your own life, get help right away. Go to your nearest emergency department or: Call your local emergency services (911 in the [place] Call a suicide crisis helpline, such as the National Suicide Prevention Lifeline at 445 651 3401 or 988 in the [place] This is open 24 hours a day in the [place] Text the Crisis Text Line at 360-078-0010 (in the [place] Summary Major depressive disorder is a mental health condition. This disorder affects feelings. Symptoms of this condition last most of the day, almost every day, for 2 weeks. The symptoms of this disorder can cause problems with relationships and with daily activities. There are treatments and support for people who get this disorder. You may need more than one type of treatment. Get help right away if you have serious thoughts about hurting yourself or others. This information is not intended to replace advice given to you by your health care provider. Make sure you discuss any questions you have with your health care provider. Document Revised: 07/25/2020 Document Reviewed: 12/11/2018 Elsevier Patient Education  2023 Elsevier Inc.    Edwina Barth, MD [facility]

## 2021-08-22 NOTE — Telephone Encounter (Signed)
Per last telephone message okay to order 7 day monitor before appointment. I thought this was ordered already, but I re-ordered the monitor this morning, notified patient that she would receive it in a few days, but I did advise we may have to push appointment out to the following week so we can have the results of monitor. Awaiting patient response.  Thanks!

## 2021-08-22 NOTE — Telephone Encounter (Signed)
Noted. Thanks.

## 2021-08-22 NOTE — Progress Notes (Unsigned)
Enrolled for [employer] to mail a ZIO XT long term holter monitor to the patients address on file.  

## 2021-08-28 NOTE — Progress Notes (Signed)
Cardiology Office Note:   Date:  08/30/2021  NAME:  Pamela Duarte    MRN: [id number] DOB:  20-Aug-1985   PCP:  Georgina Quint, MD  Cardiologist:  None  Electrophysiologist:  None   Referring MD: Georgina Quint, *   Chief Complaint  Patient presents with   Follow-up   History of Present Illness:   SHARDAI Duarte is a 36 y.o. female with a hx of DM, obesity, HTN, a tach who presents for follow-up. Seen in 2021 for a tach.  She reports that she had an episode in July while working overnight.  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  Apparently she was in [place] and reports the night was not stressful.  She reports her heart was racing on and off for several hours.  Her Apple Watch did capture several A-fib episodes.  The rhythm was quite irregular.  There are no discernible P waves.  She really has had no updates to her medical history.  She is now diabetic with an A1c of 6.7.  She reports has had no further episodes.  She reports no chest pain or trouble breathing.  She does not have sleep apnea.  She does have high blood pressure as well as diabetes.  Her CHA2DS2-VASc score equals 3.  Exam unremarkable.  EKG shows sinus rhythm today.  Blood pressure is well controlled.  She is working on losing weight.  No concerns for sleep apnea per her report.  Problem list 1.  Diabetes -A1c 6.7 -Total chol 153, HDL 56, LDL 80, triglycerides 84 2.  Hypertension 3.  Obesity 4.  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   Hyperlipidemia    Hypertension    PCOS (polycystic ovarian syndrome)     Past Surgical History: Past Surgical History:  Procedure Laterality Date   ABDOMINOPLASTY  2015   CESAREAN SECTION N/A 03/30/2019   Procedure: CESAREAN  SECTION;  Surgeon: Huel Cote, MD;  Location: [facility] LD [facility]S;  Service: Obstetrics;  Laterality: N/A;   CESAREAN SECTION N/A 10/14/2020   Procedure: CESAREAN SECTION;  Surgeon: Lavina Hamman, MD;  Location: [facility] OR[facility];  Service: Obstetrics;  Laterality: N/A;   COSMETIC SURGERY      Current Medications: Current Meds  Medication Sig   apixaban (ELIQUIS) 5 MG TABS tablet Take 1 tablet (5 mg total) by mouth 2 (two) times daily.   atorvastatin (LIPITOR) 10 MG tablet Take 1 tablet (10 mg total) by mouth daily.   Continuous Blood Gluc Sensor (FREESTYLE LIBRE 14 DAY SENSOR) MISC 1 application by Does not apply route daily.   escitalopram (LEXAPRO) 10 MG tablet Take 1 tablet (10 mg total) by mouth daily.   glucose blood (FREESTYLE TEST STRIPS) test strip Use as instructed Dx code: E11.9-Dispense Freestyle Libre test strips   metFORMIN (GLUCOPHAGE-XR) 500 MG 24 hr tablet TAKE 2 TABLETS BY MOUTH TWICE  DAILY   metoprolol succinate (TOPROL XL) 25 MG 24 hr tablet Take 1 tablet (25 mg total) by mouth daily.   oxyCODONE (OXY IR/ROXICODONE) 5 MG immediate release tablet Take 1 tablet (5 mg total) by mouth every 6 (six) hours as needed for severe pain.   Semaglutide, 1 MG/DOSE, 4 MG/3ML SOPN Inject 1 mg as directed once a week.   [DISCONTINUED] amLODipine (  NORVASC) 10 MG tablet Take 1 tablet (10 mg total) by mouth daily.     Allergies:    Patient has no known allergies.   Social History: Social History   Socioeconomic History   Marital status: Married    Spouse name: Not on file   Number of children: 1   Years of education: Not on file   Highest education level: Not on file  Occupational History   Not on file  Tobacco Use   Smoking status: Never   Smokeless tobacco: Never  Vaping Use   Vaping Use: Never used  Substance and Sexual Activity   Alcohol use: No   Drug use: No   Sexual activity: Not Currently  Other Topics Concern   Not on file  Social History Narrative   Not on file    Social Determinants of Health   Financial Resource Strain: Not on file  Food Insecurity: Not on file  Transportation Needs: Not on file  Physical Activity: Not on file  Stress: Not on file  Social Connections: Not on file     Family History: The patient's family history includes Cancer in her paternal grandfather; Diabetes in her maternal grandmother, paternal grandfather, and paternal grandmother; Hypertension in her maternal grandmother, mother, paternal grandfather, and paternal grandmother.  ROS:   All other ROS reviewed and negative. Pertinent positives noted in the HPI.     EKGs/Labs/Other Studies Reviewed:   The following studies were personally reviewed by me today:  EKG:  EKG is  ordered today.  The ekg ordered today demonstrates normal sinus rhythm heart rate 85, no acute ischemic changes or evidence of infarction, and was personally reviewed by me.   TTE 10/14/2019  1. Left ventricular ejection fraction, by estimation, is 60 to 65%. The  left ventricle has normal function. The left ventricle has no regional  wall motion abnormalities. Left ventricular diastolic parameters were  normal.   2. Right ventricular systolic function is normal. The right ventricular  size is normal. Tricuspid regurgitation signal is inadequate for assessing  PA pressure.   3. The mitral valve is normal in structure. Trivial mitral valve  regurgitation.   4. The aortic valve is tricuspid. Aortic valve regurgitation is not  visualized. No aortic stenosis is present.   5. The inferior vena cava is normal in size with greater than 50%  respiratory variability, suggesting right atrial pressure of 3 mmHg.   Recent Labs: 12/05/2020: ALT 23; BUN 8; Creatinine, Ser 0.59; Hemoglobin 10.6; Platelets 433.0; Potassium 3.9; Sodium 134; TSH 1.49   Recent Lipid Panel    Component Value Date/Time   CHOL 153 12/05/2020 1143   CHOL 140 07/26/2019 1119   TRIG 84.0 12/05/2020 1143   HDL 56.50 12/05/2020  1143   HDL 49 07/26/2019 1119   CHOLHDL 3 12/05/2020 1143   VLDL 16.8 12/05/2020 1143   LDLCALC 80 12/05/2020 1143   LDLCALC 79 07/26/2019 1119    Physical Exam:   VS:  BP 128/88   Pulse 84   Ht 5\' 7"  (1.702 m)   Wt 254 lb 3.2 oz (115.3 kg)   SpO2 98%   BMI 39.81 kg/m    Wt Readings from Last 3 Encounters:  08/30/21 254 lb 3.2 oz (115.3 kg)  08/22/21 256 lb 8 oz (116.3 kg)  02/19/21 255 lb (115.7 kg)    General: Well nourished, well developed, in no acute distress Head: Atraumatic, normal size  Eyes: PEERLA, EOMI  Neck: Supple, no JVD Endocrine:  No thryomegaly Cardiac: Normal S1, S2; RRR; no murmurs, rubs, or gallops Lungs: Clear to auscultation bilaterally, no wheezing, rhonchi or rales  Abd: Soft, nontender, no hepatomegaly  Ext: No edema, pulses 2+ Musculoskeletal: No deformities, BUE and BLE strength normal and equal Skin: Warm and dry, no rashes   Neuro: Alert and oriented to person, place, time, and situation, CNII-XII grossly intact, no focal deficits  Psych: Normal mood and affect   ASSESSMENT:   RAEVYN SOKOL is a 36 y.o. female who presents for the following: 1. Paroxysmal atrial fibrillation (HCC)   2. Primary hypertension   3. Mixed hyperlipidemia    PLAN:   1. Paroxysmal atrial fibrillation (HCC) -She has captured several atrial fibrillation episodes.  This appears to be paroxysmal.  We will switch her over to metoprolol succinate 25 mg daily.  Stop amlodipine.  CHA2DS2-VASc equals 3 (age, diabetes, hypertension).  She is quite young but does merit anticoagulation.  We will start Eliquis 5 mg twice daily.  She will complete her monitor to determine if she is having atrial fibrillation episodes that she is unaware of.  She will also continue with her Apple Watch. -We will update her TSH.  She also needs to update her echo. -We will see if we can determine what her A-fib burden is.  She is awfully young.  She would likely benefit from A-fib ablation if  continues to have episodes.  2. Primary hypertension -Amlodipine.  Start metoprolol as above.  3. Mixed hyperlipidemia -Start up to 10 mg daily.  She is diabetic.  LDL should be closer to 70.  Disposition: Return in about 6 months (around 03/02/2022).  Medication Adjustments/Labs and Tests Ordered: Current medicines are reviewed at length with the patient today.  Concerns regarding medicines are outlined above.  Orders Placed This Encounter  Procedures   TSH   EKG 12-Lead   ECHOCARDIOGRAM COMPLETE   Meds ordered this encounter  Medications   metoprolol succinate (TOPROL XL) 25 MG 24 hr tablet    Sig: Take 1 tablet (25 mg total) by mouth daily.    Dispense:  90 tablet    Refill:  3   apixaban (ELIQUIS) 5 MG TABS tablet    Sig: Take 1 tablet (5 mg total) by mouth 2 (two) times daily.    Dispense:  60 tablet    Refill:  3   atorvastatin (LIPITOR) 10 MG tablet    Sig: Take 1 tablet (10 mg total) by mouth daily.    Dispense:  90 tablet    Refill:  3    Patient Instructions  Medication Instructions:  STOP Amlodipine 5 mg daily   START Metoprolol Succinate 25 mg daily  START Eliquis 5 mg twice daily  START Lipitor 10 mg daily   *If you need a refill on your cardiac medications before your next appointment, please call your pharmacy*   Lab Work: TSH today   If you have labs (blood work) drawn today and your tests are completely normal, you will receive your results only by: MyChart Message (if you have MyChart) OR A paper copy in the mail If you have any lab test that is abnormal or we need to change your treatment, we will call you to review the results.   Testing/Procedures: Echocardiogram - Your physician has requested that you have an echocardiogram. Echocardiography is a painless test that uses sound waves to create images of your heart. It provides your doctor with information about the size and shape  of your heart and how well your heart's chambers and valves are  working. This procedure takes approximately one hour. There are no restrictions for this procedure.     Follow-Up: At [facility], you and your health needs are our priority.  As part of our continuing mission to provide you with exceptional heart care, we have created designated Provider Care Teams.  These Care Teams include your primary Cardiologist (physician) and Advanced Practice Providers (APPs -  Physician Assistants and Nurse Practitioners) who all work together to provide you with the care you need, when you need it.  We recommend signing up for the patient portal called "MyChart".  Sign up information is provided on this After Visit Summary.  MyChart is used to connect with patients for Virtual Visits (Telemedicine).  Patients are able to view lab/test results, encounter notes, upcoming appointments, etc.  Non-urgent messages can be sent to your provider as well.   To learn more about what you can do with MyChart, go to [email or web address].    Your next appointment:   6 month(s)  The format for your next appointment:   In Person  Provider:   Lennie Odor, MD            Time Spent with Patient: I have spent a total of 35 minutes with patient reviewing hospital notes, telemetry, EKGs, labs and examining the patient as well as establishing an assessment and plan that was discussed with the patient.  > 50% of time was spent in direct patient care.  Signed, Lenna Gilford. Flora Lipps, MD, [facility]  [facility]  [address], [address] [place], [place] [address] [phone]  08/30/2021 8:43 AM

## 2021-08-30 ENCOUNTER — Encounter: Payer: Self-pay | Admitting: Cardiovascular Disease

## 2021-08-30 ENCOUNTER — Ambulatory Visit (INDEPENDENT_AMBULATORY_CARE_PROVIDER_SITE_OTHER): Payer: BC Managed Care – PPO | Admitting: Cardiovascular Disease

## 2021-08-30 VITALS — BP 128/88 | HR 84 | Ht 67.0 in | Wt 254.2 lb

## 2021-08-30 DIAGNOSIS — E782 Mixed hyperlipidemia: Secondary | ICD-10-CM

## 2021-08-30 DIAGNOSIS — I10 Essential (primary) hypertension: Secondary | ICD-10-CM

## 2021-08-30 DIAGNOSIS — I480 Paroxysmal atrial fibrillation: Secondary | ICD-10-CM | POA: Diagnosis not present

## 2021-08-30 DIAGNOSIS — I471 Supraventricular tachycardia: Secondary | ICD-10-CM

## 2021-08-30 LAB — TSH: TSH: 0.824 u[IU]/mL (ref 0.450–4.500)

## 2021-08-30 MED ORDER — APIXABAN 5 MG PO TABS: 5.0000 mg | ORAL_TABLET | Freq: Two times a day (BID) | ORAL | 3 refills | Status: DC

## 2021-08-30 MED ORDER — METOPROLOL SUCCINATE ER 25 MG PO TB24: 25.0000 mg | ORAL_TABLET | Freq: Every day | ORAL | 3 refills | Status: DC

## 2021-08-30 MED ORDER — ATORVASTATIN CALCIUM 10 MG PO TABS: 10.0000 mg | ORAL_TABLET | Freq: Every day | ORAL | 3 refills | Status: DC

## 2021-08-30 NOTE — Patient Instructions (Signed)
Medication Instructions:  STOP Amlodipine 5 mg daily   START Metoprolol Succinate 25 mg daily  START Eliquis 5 mg twice daily  START Lipitor 10 mg daily   *If you need a refill on your cardiac medications before your next appointment, please call your pharmacy*   Lab Work: TSH today   If you have labs (blood work) drawn today and your tests are completely normal, you will receive your results only by: MyChart Message (if you have MyChart) OR A paper copy in the mail If you have any lab test that is abnormal or we need to change your treatment, we will call you to review the results.   Testing/Procedures: Echocardiogram - Your physician has requested that you have an echocardiogram. Echocardiography is a painless test that uses sound waves to create images of your heart. It provides your doctor with information about the size and shape of your heart and how well your heart's chambers and valves are working. This procedure takes approximately one hour. There are no restrictions for this procedure.     Follow-Up: At [facility], you and your health needs are our priority.  As part of our continuing mission to provide you with exceptional heart care, we have created designated Provider Care Teams.  These Care Teams include your primary Cardiologist (physician) and Advanced Practice Providers (APPs -  Physician Assistants and Nurse Practitioners) who all work together to provide you with the care you need, when you need it.  We recommend signing up for the patient portal called "MyChart".  Sign up information is provided on this After Visit Summary.  MyChart is used to connect with patients for Virtual Visits (Telemedicine).  Patients are able to view lab/test results, encounter notes, upcoming appointments, etc.  Non-urgent messages can be sent to your provider as well.   To learn more about what you can do with MyChart, go to [email or web address].    Your next appointment:   6  month(s)  The format for your next appointment:   In Person  Provider:   Lennie Odor, MD

## 2021-08-31 ENCOUNTER — Other Ambulatory Visit: Payer: Self-pay | Admitting: Emergency Medicine

## 2021-08-31 DIAGNOSIS — F4321 Adjustment disorder with depressed mood: Secondary | ICD-10-CM

## 2021-09-02 DIAGNOSIS — I471 Supraventricular tachycardia: Secondary | ICD-10-CM

## 2021-09-10 ENCOUNTER — Other Ambulatory Visit: Payer: Self-pay | Admitting: Emergency Medicine

## 2021-09-10 DIAGNOSIS — I152 Hypertension secondary to endocrine disorders: Secondary | ICD-10-CM

## 2021-09-12 ENCOUNTER — Ambulatory Visit (HOSPITAL_COMMUNITY): Payer: BC Managed Care – PPO | Attending: Internal Medicine

## 2021-09-12 DIAGNOSIS — I480 Paroxysmal atrial fibrillation: Secondary | ICD-10-CM | POA: Diagnosis not present

## 2021-09-12 LAB — ECHOCARDIOGRAM COMPLETE
Area-P 1/2: 4.86 cm2
S' Lateral: 2.4 cm

## 2021-09-14 ENCOUNTER — Encounter: Payer: Self-pay | Admitting: Emergency Medicine

## 2021-09-17 MED ORDER — SEMAGLUTIDE (2 MG/DOSE) 8 MG/3ML ~~LOC~~ SOPN
2.0000 mg | PEN_INJECTOR | SUBCUTANEOUS | 1 refills | Status: DC
Start: 2021-09-17 — End: 2021-10-13

## 2021-09-17 NOTE — Telephone Encounter (Signed)
Thank you :)

## 2021-09-17 NOTE — Telephone Encounter (Signed)
Patient tolerated 1mg  of Ozempic well. New prescription for 2mg  Ozempic sent to pharmacy

## 2021-09-17 NOTE — Telephone Encounter (Signed)
If she is tolerating it well, we can increase the dose.  Thanks.

## 2021-09-26 ENCOUNTER — Ambulatory Visit (INDEPENDENT_AMBULATORY_CARE_PROVIDER_SITE_OTHER): Payer: BC Managed Care – PPO | Admitting: Emergency Medicine

## 2021-09-26 ENCOUNTER — Encounter: Payer: Self-pay | Admitting: Emergency Medicine

## 2021-09-26 DIAGNOSIS — F4321 Adjustment disorder with depressed mood: Secondary | ICD-10-CM

## 2021-09-26 MED ORDER — ESCITALOPRAM OXALATE 10 MG PO TABS: 10.0000 mg | ORAL_TABLET | Freq: Every day | ORAL | 3 refills | Status: DC

## 2021-09-26 NOTE — Assessment & Plan Note (Signed)
Much improved.  Tolerating medication well. Continue Lexapro 10 mg daily. Follow-up during physical next November.  Earlier as needed.

## 2021-09-26 NOTE — Progress Notes (Signed)
Pamela Duarte 36 y.o.   Chief Complaint  Patient presents with   Follow-up    No concerns    HISTORY OF PRESENT ILLNESS: This is a 36 y.o. female A1A here for 4-week follow-up of situational depression and anxiety. Started on Lexapro 10 mg daily.  Tolerating it well and working well. Showing improvement.  No complaints or any other medical concerns today.  HPI   Prior to Admission medications   Medication Sig Start Date End Date Taking? Authorizing Provider  apixaban (ELIQUIS) 5 MG TABS tablet Take 1 tablet (5 mg total) by mouth 2 (two) times daily. 08/30/21  Yes O'Neal, Ronnald Ramp, MD  atorvastatin (LIPITOR) 10 MG tablet Take 1 tablet (10 mg total) by mouth daily. 08/30/21 08/25/22 Yes O'Neal, Ronnald Ramp, MD  Continuous Blood Gluc Sensor (FREESTYLE LIBRE 14 DAY SENSOR) MISC 1 application by Does not apply route daily. 12/05/20  Yes Georgina Quint, MD  escitalopram (LEXAPRO) 10 MG tablet TAKE 1 TABLET BY MOUTH DAILY 09/01/21  Yes Monnie Gudgel, Delta, MD  glucose blood (FREESTYLE TEST STRIPS) test strip Use as instructed Dx code: E11.9-Dispense Freestyle Libre test strips 07/26/17  Yes Stallings, Zoe A, MD  metFORMIN (GLUCOPHAGE-XR) 500 MG 24 hr tablet TAKE 2 TABLETS BY MOUTH TWICE  DAILY 05/27/21  Yes Aj Crunkleton, Eilleen Kempf, MD  metoprolol succinate (TOPROL XL) 25 MG 24 hr tablet Take 1 tablet (25 mg total) by mouth daily. 08/30/21  Yes O'Neal, Ronnald Ramp, MD  oxyCODONE (OXY IR/ROXICODONE) 5 MG immediate release tablet Take 1 tablet (5 mg total) by mouth every 6 (six) hours as needed for severe pain. 10/17/20  Yes Shivaji, Valerie Roys, MD  Semaglutide, 2 MG/DOSE, 8 MG/3ML SOPN Inject 2 mg as directed once a week. 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   Hyperlipidemia    Hypertension    PCOS (polycystic ovarian syndrome)     Past Surgical History:  Procedure Laterality Date   ABDOMINOPLASTY  2015   CESAREAN SECTION N/A 03/30/2019   Procedure: CESAREAN SECTION;  Surgeon: Huel Cote, MD;  Location: [facility];  Service: Obstetrics;  Laterality: N/A;   CESAREAN SECTION N/A 10/14/2020   Procedure: CESAREAN SECTION;  Surgeon: Lavina Hamman, MD;  Location: [facility];  Service: Obstetrics;  Laterality: N/A;   COSMETIC SURGERY      Social History   Socioeconomic History   Marital status: Married    Spouse name: Not on file   Number of children: 1   Years of education: Not on file   Highest education level: Not on file  Occupational History   Not on file  Tobacco Use   Smoking status: Never   Smokeless tobacco: Never  Vaping Use   Vaping Use: Never used  Substance and Sexual Activity   Alcohol use: No   Drug use: No   Sexual activity: Not Currently  Other Topics Concern  Not on file  Social History Narrative   Not on file   Social Determinants of Health   Financial Resource Strain: Not on file  Food Insecurity: Not on file  Transportation Needs: Not on file  Physical Activity: Not on file  Stress: Not on file  Social Connections: Not on file  Intimate Partner Violence: Not on file    Family History  Problem Relation Age of Onset   Diabetes Maternal Grandmother    Hypertension Maternal Grandmother    Diabetes Paternal Grandfather    Hypertension Paternal Grandfather    Cancer Paternal Grandfather    Hypertension Paternal Grandmother    Diabetes Paternal Grandmother     Hypertension Mother      Review of Systems  Constitutional: Negative.  Negative for chills and fever.  HENT: Negative.  Negative for congestion and sore throat.   Respiratory: Negative.  Negative for cough and shortness of breath.   Cardiovascular: Negative.   Gastrointestinal:  Negative for abdominal pain, nausea and vomiting.  Genitourinary: Negative.   Skin: Negative.  Negative for rash.  Neurological:  Negative for dizziness and headaches.  All other systems reviewed and are negative.  Today's Vitals   09/26/21 0928  BP: 138/86  Pulse: 85  Temp: 98.7 F (37.1 C)  TempSrc: Oral  SpO2: 98%  Weight: 248 lb 4 oz (112.6 kg)  Height: 5\' 2"  (1.575 m)   Body mass index is 45.41 kg/m. Wt Readings from Last 3 Encounters:  09/26/21 248 lb 4 oz (112.6 kg)  08/30/21 254 lb 3.2 oz (115.3 kg)  08/22/21 256 lb 8 oz (116.3 kg)     Physical Exam Vitals reviewed.  Constitutional:      Appearance: Normal appearance.  HENT:     Head: Normocephalic.  Eyes:     Extraocular Movements: Extraocular movements intact.  Pulmonary:     Effort: Pulmonary effort is normal.  Skin:    General: Skin is warm and dry.     Capillary Refill: Capillary refill takes less than 2 seconds.  Neurological:     General: No focal deficit present.     Mental Status: She is alert and oriented to person, place, and time.  Psychiatric:        Mood and Affect: Mood normal.        Behavior: Behavior normal.      ASSESSMENT & PLAN: Problem List Items Addressed This Visit       Other   Situational depression    Much improved.  Tolerating medication well. Continue Lexapro 10 mg daily. Follow-up during physical next November.  Earlier as needed.      Relevant Medications   escitalopram (LEXAPRO) 10 MG tablet   Patient Instructions  Health Maintenance, Female Adopting a healthy lifestyle and getting preventive care are important in promoting health and wellness. Ask your health care provider  about: The right schedule for you to have regular tests and exams. Things you can do on your own to prevent diseases and keep yourself healthy. What should I know about diet, weight, and exercise? Eat a healthy diet  Eat a diet that includes plenty of vegetables, fruits, low-fat dairy products, and lean protein. Do not eat a lot of foods that are high in solid fats, added sugars, or sodium. Maintain a healthy weight Body mass index (BMI) is used to identify weight problems. It estimates body fat based on height and weight. Your health care provider can help determine your BMI and help you achieve  or maintain a healthy weight. Get regular exercise Get regular exercise. This is one of the most important things you can do for your health. Most adults should: Exercise for at least 150 minutes each week. The exercise should increase your heart rate and make you sweat (moderate-intensity exercise). Do strengthening exercises at least twice a week. This is in addition to the moderate-intensity exercise. Spend less time sitting. Even light physical activity can be beneficial. Watch cholesterol and blood lipids Have your blood tested for lipids and cholesterol at 37 years of age, then have this test every 5 years. Have your cholesterol levels checked more often if: Your lipid or cholesterol levels are high. You are older than 37 years of age. You are at high risk for heart disease. What should I know about cancer screening? Depending on your health history and family history, you may need to have cancer screening at various ages. This may include screening for: Breast cancer. Cervical cancer. Colorectal cancer. Skin cancer. Lung cancer. What should I know about heart disease, diabetes, and high blood pressure? Blood pressure and heart disease High blood pressure causes heart disease and increases the risk of stroke. This is more likely to develop in people who have high blood pressure readings  or are overweight. Have your blood pressure checked: Every 3-5 years if you are 83-53 years of age. Every year if you are 4 years old or older. Diabetes Have regular diabetes screenings. This checks your fasting blood sugar level. Have the screening done: Once every three years after age 70 if you are at a normal weight and have a low risk for diabetes. More often and at a younger age if you are overweight or have a high risk for diabetes. What should I know about preventing infection? Hepatitis B If you have a higher risk for hepatitis B, you should be screened for this virus. Talk with your health care provider to find out if you are at risk for hepatitis B infection. Hepatitis C Testing is recommended for: Everyone born from [birth year] through 1966. Anyone with known risk factors for hepatitis C. Sexually transmitted infections (STIs) Get screened for STIs, including gonorrhea and chlamydia, if: You are sexually active and are younger than 37 years of age. You are older than 37 years of age and your health care provider tells you that you are at risk for this type of infection. Your sexual activity has changed since you were last screened, and you are at increased risk for chlamydia or gonorrhea. Ask your health care provider if you are at risk. Ask your health care provider about whether you are at high risk for HIV. Your health care provider may recommend a prescription medicine to help prevent HIV infection. If you choose to take medicine to prevent HIV, you should first get tested for HIV. You should then be tested every 3 months for as long as you are taking the medicine. Pregnancy If you are about to stop having your period (premenopausal) and you may become pregnant, seek counseling before you get pregnant. Take 400 to 800 micrograms (mcg) of folic acid every day if you become pregnant. Ask for birth control (contraception) if you want to prevent pregnancy. Osteoporosis and  menopause Osteoporosis is a disease in which the bones lose minerals and strength with aging. This can result in bone fractures. If you are 55 years old or older, or if you are at risk for osteoporosis and fractures, ask your health care provider if you  should: Be screened for bone loss. Take a calcium or vitamin D supplement to lower your risk of fractures. Be given hormone replacement therapy (HRT) to treat symptoms of menopause. Follow these instructions at home: Alcohol use Do not drink alcohol if: Your health care provider tells you not to drink. You are pregnant, may be pregnant, or are planning to become pregnant. If you drink alcohol: Limit how much you have to: 0-1 drink a day. Know how much alcohol is in your drink. In the [place] one drink equals one 12 oz bottle of beer (355 mL), one 5 oz glass of wine (148 mL), or one 1 oz glass of hard liquor (44 mL). Lifestyle Do not use any products that contain nicotine or tobacco. These products include cigarettes, chewing tobacco, and vaping devices, such as e-cigarettes. If you need help quitting, ask your health care provider. Do not use street drugs. Do not share needles. Ask your health care provider for help if you need support or information about quitting drugs. General instructions Schedule regular health, dental, and eye exams. Stay current with your vaccines. Tell your health care provider if: You often feel depressed. You have ever been abused or do not feel safe at home. Summary Adopting a healthy lifestyle and getting preventive care are important in promoting health and wellness. Follow your health care provider's instructions about healthy diet, exercising, and getting tested or screened for diseases. Follow your health care provider's instructions on monitoring your cholesterol and blood pressure. This information is not intended to replace advice given to you by your health care provider. Make sure you discuss any  questions you have with your health care provider. Document Revised: 05/21/2020 Document Reviewed: 05/21/2020 Elsevier Patient Education  2023 [employer]     Edwina Barth, MD [facility]

## 2021-09-26 NOTE — Patient Instructions (Signed)

## 2021-10-12 ENCOUNTER — Other Ambulatory Visit: Payer: Self-pay | Admitting: Emergency Medicine

## 2021-10-13 ENCOUNTER — Other Ambulatory Visit: Payer: Self-pay | Admitting: Cardiovascular Disease

## 2021-10-13 DIAGNOSIS — I480 Paroxysmal atrial fibrillation: Secondary | ICD-10-CM

## 2021-10-14 NOTE — Telephone Encounter (Signed)
Prescription refill request for Eliquis received. Indication: Afib  Last office visit: 08/30/21 (O'Neal) Scr: 0.59 (12/05/20)  Age: 36 Weight: 112.6kg  Appropriate dose and refill sent to requested pharmacy.

## 2021-10-27 ENCOUNTER — Other Ambulatory Visit: Payer: Self-pay | Admitting: Emergency Medicine

## 2021-10-27 DIAGNOSIS — E118 Type 2 diabetes mellitus with unspecified complications: Secondary | ICD-10-CM

## 2021-11-11 ENCOUNTER — Encounter: Payer: Self-pay | Admitting: Emergency Medicine

## 2021-11-11 MED ORDER — TIRZEPATIDE 10 MG/0.5ML ~~LOC~~ SOAJ: 10.0000 mg | SUBCUTANEOUS | 1 refills | Status: DC

## 2021-11-11 NOTE — Telephone Encounter (Signed)
One mg weekly

## 2021-11-11 NOTE — Telephone Encounter (Signed)
Okay to switch to Mounjaro

## 2021-11-14 ENCOUNTER — Encounter: Payer: Self-pay | Admitting: Emergency Medicine

## 2021-11-14 ENCOUNTER — Ambulatory Visit (INDEPENDENT_AMBULATORY_CARE_PROVIDER_SITE_OTHER): Payer: BC Managed Care – PPO | Admitting: Emergency Medicine

## 2021-11-14 ENCOUNTER — Telehealth: Payer: Self-pay | Admitting: Emergency Medicine

## 2021-11-14 VITALS — BP 130/70 | HR 95 | Temp 98.5°F | Ht 67.0 in | Wt 245.2 lb

## 2021-11-14 DIAGNOSIS — Z1329 Encounter for screening for other suspected endocrine disorder: Secondary | ICD-10-CM | POA: Diagnosis not present

## 2021-11-14 DIAGNOSIS — F4321 Adjustment disorder with depressed mood: Secondary | ICD-10-CM

## 2021-11-14 DIAGNOSIS — Z23 Encounter for immunization: Secondary | ICD-10-CM | POA: Diagnosis not present

## 2021-11-14 DIAGNOSIS — I152 Hypertension secondary to endocrine disorders: Secondary | ICD-10-CM | POA: Diagnosis not present

## 2021-11-14 DIAGNOSIS — Z130 Encounter for screening for diseases of the blood and blood-forming organs and certain disorders involving the immune mechanism: Secondary | ICD-10-CM | POA: Diagnosis not present

## 2021-11-14 DIAGNOSIS — Z13228 Encounter for screening for other metabolic disorders: Secondary | ICD-10-CM

## 2021-11-14 DIAGNOSIS — E1159 Type 2 diabetes mellitus with other circulatory complications: Secondary | ICD-10-CM

## 2021-11-14 DIAGNOSIS — Z0000 Encounter for general adult medical examination without abnormal findings: Secondary | ICD-10-CM

## 2021-11-14 DIAGNOSIS — Z13220 Encounter for screening for lipoid disorders: Secondary | ICD-10-CM

## 2021-11-14 LAB — MICROALBUMIN / CREATININE URINE RATIO
Creatinine,U: 194.8 mg/dL
Microalb Creat Ratio: 1.6 mg/g (ref 0.0–30.0)
Microalb, Ur: 3 mg/dL — ABNORMAL HIGH (ref 0.0–1.9)

## 2021-11-14 LAB — CBC WITH DIFFERENTIAL/PLATELET
Basophils Absolute: 0 10*3/uL (ref 0.0–0.1)
Basophils Relative: 0.3 % (ref 0.0–3.0)
Eosinophils Absolute: 0 10*3/uL (ref 0.0–0.7)
Eosinophils Relative: 0.5 % (ref 0.0–5.0)
HCT: 37.6 % (ref 36.0–46.0)
Hemoglobin: 12.4 g/dL (ref 12.0–15.0)
Lymphocytes Relative: 25.7 % (ref 12.0–46.0)
Lymphs Abs: 2.3 10*3/uL (ref 0.7–4.0)
MCHC: 33 g/dL (ref 30.0–36.0)
MCV: 84.8 fl (ref 78.0–100.0)
Monocytes Absolute: 0.6 10*3/uL (ref 0.1–1.0)
Monocytes Relative: 6.4 % (ref 3.0–12.0)
Neutro Abs: 6 10*3/uL (ref 1.4–7.7)
Neutrophils Relative %: 67.1 % (ref 43.0–77.0)
Platelets: 346 10*3/uL (ref 150.0–400.0)
RBC: 4.44 Mil/uL (ref 3.87–5.11)
RDW: 14.9 % (ref 11.5–15.5)
WBC: 8.9 10*3/uL (ref 4.0–10.5)

## 2021-11-14 LAB — COMPREHENSIVE METABOLIC PANEL
ALT: 17 U/L (ref 0–35)
AST: 17 U/L (ref 0–37)
Albumin: 4.2 g/dL (ref 3.5–5.2)
Alkaline Phosphatase: 76 U/L (ref 39–117)
BUN: 8 mg/dL (ref 6–23)
CO2: 29 mEq/L (ref 19–32)
Calcium: 9.2 mg/dL (ref 8.4–10.5)
Chloride: 100 mEq/L (ref 96–112)
Creatinine, Ser: 0.56 mg/dL (ref 0.40–1.20)
GFR: 116.29 mL/min (ref >60.00)
Glucose, Bld: 93 mg/dL (ref 70–99)
Potassium: 3.8 mEq/L (ref 3.5–5.1)
Sodium: 137 mEq/L (ref 135–145)
Total Bilirubin: 0.4 mg/dL (ref 0.2–1.2)
Total Protein: 8 g/dL (ref 6.0–8.3)

## 2021-11-14 LAB — LIPID PANEL
Cholesterol: 98 mg/dL (ref 0–200)
HDL: 45 mg/dL (ref >39.00)
LDL Cholesterol: 40 mg/dL (ref 0–99)
NonHDL: 52.61
Total CHOL/HDL Ratio: 2
Triglycerides: 61 mg/dL (ref 0.0–149.0)
VLDL: 12.2 mg/dL (ref 0.0–40.0)

## 2021-11-14 LAB — POCT GLYCOSYLATED HEMOGLOBIN (HGB A1C): Hemoglobin A1C: 5.9 % — AB (ref 4.0–5.6)

## 2021-11-14 LAB — VITAMIN D 25 HYDROXY (VIT D DEFICIENCY, FRACTURES): VITD: 31.36 ng/mL (ref 30.00–100.00)

## 2021-11-14 MED ORDER — FREESTYLE LIBRE 14 DAY SENSOR MISC: 1.0000 "application " | Freq: Every day | 4 refills | Status: DC

## 2021-11-14 MED ORDER — TIRZEPATIDE 12.5 MG/0.5ML ~~LOC~~ SOAJ: 12.5000 mg | SUBCUTANEOUS | 3 refills | Status: DC

## 2021-11-14 NOTE — Assessment & Plan Note (Signed)
Well-controlled hypertension.  Continue metoprolol succinate 25 mg daily Well-controlled diabetes with hemoglobin A1c of 5.9. Continue Mounjaro weekly at 12.5 mg Continue metformin 1000 mg daily Cardiovascular risks associated with hypertension and diabetes discussed Diet and nutrition discussed.

## 2021-11-14 NOTE — Telephone Encounter (Signed)
Pamela Duarte with [facility] needs clarification on RX Continuous Blood Gluc Sensor (FREESTYLE LIBRE 14 DAY SENSOR) MISC   Call back number is [phone] and the reference number is [id number]

## 2021-11-14 NOTE — Progress Notes (Signed)
Pamela Duarte 36 y.o.   Chief Complaint  Patient presents with   Annual Exam    Patient states she is more fatigued, patient wants lab done, vitamin D   Medication Management    Mounjaro 10mg  is on back order    HISTORY OF PRESENT ILLNESS: This is a 36 y.o. female A1A here for annual exam. History of diabetes and hypertension.  Doing well.  Eating better and exercising more. History of paroxysmal atrial fibrillation.  Sees cardiologist on a regular basis.  Presently on Eliquis 5 mg twice a day.  Next cardiology visit February 2024. Has occasional fatigue.  Interested in checking vitamin D levels. No other complaints or medical concerns today.   HPI   Prior to Admission medications   Medication Sig Start Date End Date Taking? Authorizing Provider  apixaban (ELIQUIS) 5 MG TABS tablet TAKE 1 TABLET BY MOUTH TWICE  DAILY 10/14/21  Yes O'Neal, Cassie Freer, MD  atorvastatin (LIPITOR) 10 MG tablet Take 1 tablet (10 mg total) by mouth daily. 08/30/21 08/25/22 Yes O'Neal, Cassie Freer, MD  escitalopram (LEXAPRO) 10 MG tablet Take 1 tablet (10 mg total) by mouth daily. 09/26/21  Yes Moorea Boissonneault, Ines Bloomer, MD  glucose blood (FREESTYLE TEST STRIPS) test strip Use as instructed Dx code: E11.9-Dispense Freestyle Libre test strips 07/26/17  Yes Stallings, Zoe A, MD  metFORMIN (GLUCOPHAGE-XR) 500 MG 24 hr tablet TAKE 2 TABLETS BY MOUTH TWICE  DAILY 10/28/21  Yes Roxane Puerto, Ines Bloomer, MD  metoprolol succinate (TOPROL XL) 25 MG 24 hr tablet Take 1 tablet (25 mg total) by mouth daily. 08/30/21  Yes O'Neal, Cassie Freer, MD  oxyCODONE (OXY IR/ROXICODONE) 5 MG immediate release tablet Take 1 tablet (5 mg total) by mouth every 6 (six) hours as needed for severe pain. 10/17/20  Yes Shivaji, Melida Quitter, MD  tirzepatide [facility]) 12.5 MG/0.5ML Pen Inject 12.5 mg into the skin once a week. 11/14/21  Yes Shanah Guimaraes, Ines Bloomer, MD  Continuous Blood Gluc Sensor (FREESTYLE LIBRE 14 DAY SENSOR) MISC 1  application  by Does not apply route daily. 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   Hyperlipidemia    Hypertension    PCOS (polycystic ovarian syndrome)     Past Surgical History:  Procedure Laterality Date   ABDOMINOPLASTY  2015   CESAREAN SECTION N/A 03/30/2019   Procedure: CESAREAN SECTION;  Surgeon: Paula Compton, MD;  Location: [facility] ORS;  Service: Obstetrics;  Laterality: N/A;   CESAREAN SECTION N/A 10/14/2020   Procedure: CESAREAN SECTION;  Surgeon: Cheri Fowler, MD;  Location: [facility] OR[facility];  Service: Obstetrics;  Laterality: N/A;   COSMETIC SURGERY      Social History   Socioeconomic History   Marital status: Married    Spouse name: Not on file   Number of children: 1   Years of education: Not on file   Highest education level: Not on file  Occupational History  Not on file  Tobacco Use   Smoking status: Never   Smokeless tobacco: Never  Vaping Use   Vaping Use: Never used  Substance and Sexual Activity   Alcohol use: No   Drug use: No   Sexual activity: Not Currently  Other Topics Concern   Not on file  Social History Narrative   Not on file   Social Determinants of Health   Financial Resource Strain: Not on file  Food Insecurity: Not on file  Transportation Needs: Not on file  Physical Activity: Not on file  Stress: Not on file  Social Connections: Not on file  Intimate Partner Violence: Not on file     Family History  Problem Relation Age of Onset   Diabetes Maternal Grandmother    Hypertension Maternal Grandmother    Diabetes Paternal Grandfather    Hypertension Paternal Grandfather    Cancer Paternal Grandfather    Hypertension Paternal Grandmother    Diabetes Paternal Grandmother    Hypertension Mother      Review of Systems  Constitutional: Negative.  Negative for chills and fever.  HENT: Negative.  Negative for congestion and sore throat.   Respiratory: Negative.  Negative for cough and shortness of breath.   Cardiovascular: Negative.  Negative for chest pain and palpitations.  Gastrointestinal:  Negative for abdominal pain, nausea and vomiting.  Genitourinary: Negative.   Skin: Negative.  Negative for rash.  Neurological: Negative.  Negative for dizziness and headaches.  All other systems reviewed and are negative.  Today's Vitals   11/14/21 0934  BP: 134/86  Pulse: 95  Temp: 98.5 F (36.9 C)  TempSrc: Oral  SpO2: 95%  Weight: 245 lb 4 oz (111.2 kg)  Height: 5\' 7"  (1.702 m)   Body mass index is 38.41 kg/m. Wt Readings from Last 3 Encounters:  11/14/21 245 lb 4 oz (111.2 kg)  09/26/21 248 lb 4 oz (112.6 kg)  08/30/21 254 lb 3.2 oz (115.3 kg)     Physical Exam Vitals reviewed.  Constitutional:      Appearance: Normal appearance.  HENT:     Head: Normocephalic.     Right Ear: Tympanic membrane, ear canal and external ear normal.     Left Ear: Tympanic membrane, ear canal and external ear normal.     Mouth/Throat:     Mouth: Mucous membranes are moist.     Pharynx: Oropharynx is clear.  Eyes:     Extraocular Movements: Extraocular movements intact.     Conjunctiva/sclera: Conjunctivae normal.     Pupils: Pupils are equal, round, and reactive to light.  Cardiovascular:     Rate and Rhythm: Normal rate and regular rhythm.     Pulses: Normal pulses.     Heart sounds: Normal heart sounds.  Pulmonary:     Effort: Pulmonary effort is normal.      Breath sounds: Normal breath sounds.  Abdominal:     Palpations: Abdomen is soft.     Tenderness: There is no abdominal tenderness.  Musculoskeletal:        General: Normal range of motion.     Cervical back: No tenderness.  Lymphadenopathy:     Cervical: No cervical adenopathy.  Skin:    General: Skin is warm and dry.  Neurological:     General: No focal deficit present.     Mental Status: She is alert and oriented to person, place, and time.  Psychiatric:        Mood and Affect: Mood normal.  Behavior: Behavior normal.    Results for orders placed or performed in visit on 11/14/21 (from the past 24 hour(s))  POCT HgB A1C     Status: Abnormal   Collection Time: 11/14/21 10:14 AM  Result Value Ref Range   Hemoglobin A1C 5.9 (A) 4.0 - 5.6 %   HbA1c POC (<> result, manual entry)     HbA1c, POC (prediabetic range)     HbA1c, POC (controlled diabetic range)       ASSESSMENT & PLAN: Problem List Items Addressed This Visit       Cardiovascular and Mediastinum   Hypertension associated with diabetes ([place])    Well-controlled hypertension.  Continue metoprolol succinate 25 mg daily Well-controlled diabetes with hemoglobin A1c of 5.9. Continue Mounjaro weekly at 12.5 mg Continue metformin 1000 mg daily Cardiovascular risks associated with hypertension and diabetes discussed Diet and nutrition discussed.      Relevant Medications   Continuous Blood Gluc Sensor (FREESTYLE LIBRE 14 DAY SENSOR) MISC   tirzepatide [facility]) 12.5 MG/0.5ML Pen   Other Relevant Orders   POCT HgB A1C (Completed)   CBC with Differential   Comprehensive metabolic panel   Lipid panel   Urine Microalbumin w/creat. ratio     Other   Situational depression    Stable.  Lexapro 10 mg daily working very well for her.      Other Visit Diagnoses     Routine general medical examination at a health care facility    -  Primary   Need for vaccination       Relevant Orders   Flu Vaccine QUAD 6+  mos PF IM (Fluarix Quad PF) (Completed)   Screening for deficiency anemia       Relevant Orders   CBC with Differential   Screening for lipoid disorders       Relevant Orders   Lipid panel   Screening for endocrine, metabolic and immunity disorder       Relevant Orders   VITAMIN D 25 Hydroxy (Vit-D Deficiency, Fractures)      Modifiable risk factors discussed with patient. Anticipatory guidance according to age provided. The following topics were also discussed: Social Determinants of Health Smoking.  Non-smoker Diet and nutrition.  Eating better Benefits of exercise.  Exercising regularly Cancer family history review Vaccinations reviewed and recommendations Cardiovascular risk assessment and need for blood work Mental health including depression and anxiety Fall and accident prevention  Patient Instructions  Health Maintenance, Female Adopting a healthy lifestyle and getting preventive care are important in promoting health and wellness. Ask your health care provider about: The right schedule for you to have regular tests and exams. Things you can do on your own to prevent diseases and keep yourself healthy. What should I know about diet, weight, and exercise? Eat a healthy diet  Eat a diet that includes plenty of vegetables, fruits, low-fat dairy products, and lean protein. Do not eat a lot of foods that are high in solid fats, added sugars, or sodium. Maintain a healthy weight Body mass index (BMI) is used to identify weight problems. It estimates body fat based on height and weight. Your health care provider can help determine your BMI and help you achieve or maintain a healthy weight. Get regular exercise Get regular exercise. This is one of the most important things you can do for your health. Most adults should: Exercise for at least 150 minutes each week. The exercise should increase your heart rate and make you sweat (moderate-intensity exercise).  Do strengthening  exercises at least twice a week. This is in addition to the moderate-intensity exercise. Spend less time sitting. Even light physical activity can be beneficial. Watch cholesterol and blood lipids Have your blood tested for lipids and cholesterol at 37 years of age, then have this test every 5 years. Have your cholesterol levels checked more often if: Your lipid or cholesterol levels are high. You are older than 37 years of age. You are at high risk for heart disease. What should I know about cancer screening? Depending on your health history and family history, you may need to have cancer screening at various ages. This may include screening for: Breast cancer. Cervical cancer. Colorectal cancer. Skin cancer. Lung cancer. What should I know about heart disease, diabetes, and high blood pressure? Blood pressure and heart disease High blood pressure causes heart disease and increases the risk of stroke. This is more likely to develop in people who have high blood pressure readings or are overweight. Have your blood pressure checked: Every 3-5 years if you are 47-3 years of age. Every year if you are 75 years old or older. Diabetes Have regular diabetes screenings. This checks your fasting blood sugar level. Have the screening done: Once every three years after age 34 if you are at a normal weight and have a low risk for diabetes. More often and at a younger age if you are overweight or have a high risk for diabetes. What should I know about preventing infection? Hepatitis B If you have a higher risk for hepatitis B, you should be screened for this virus. Talk with your health care provider to find out if you are at risk for hepatitis B infection. Hepatitis C Testing is recommended for: Everyone born from [birth year] through 1966. Anyone with known risk factors for hepatitis C. Sexually transmitted infections (STIs) Get screened for STIs, including gonorrhea and chlamydia, if: You are  sexually active and are younger than 37 years of age. You are older than 37 years of age and your health care provider tells you that you are at risk for this type of infection. Your sexual activity has changed since you were last screened, and you are at increased risk for chlamydia or gonorrhea. Ask your health care provider if you are at risk. Ask your health care provider about whether you are at high risk for HIV. Your health care provider may recommend a prescription medicine to help prevent HIV infection. If you choose to take medicine to prevent HIV, you should first get tested for HIV. You should then be tested every 3 months for as long as you are taking the medicine. Pregnancy If you are about to stop having your period (premenopausal) and you may become pregnant, seek counseling before you get pregnant. Take 400 to 800 micrograms (mcg) of folic acid every day if you become pregnant. Ask for birth control (contraception) if you want to prevent pregnancy. Osteoporosis and menopause Osteoporosis is a disease in which the bones lose minerals and strength with aging. This can result in bone fractures. If you are 67 years old or older, or if you are at risk for osteoporosis and fractures, ask your health care provider if you should: Be screened for bone loss. Take a calcium or vitamin D supplement to lower your risk of fractures. Be given hormone replacement therapy (HRT) to treat symptoms of menopause. Follow these instructions at home: Alcohol use Do not drink alcohol if: Your health care provider tells you not  to drink. You are pregnant, may be pregnant, or are planning to become pregnant. If you drink alcohol: Limit how much you have to: 0-1 drink a day. Know how much alcohol is in your drink. In the [place] one drink equals one 12 oz bottle of beer (355 mL), one 5 oz glass of wine (148 mL), or one 1 oz glass of hard liquor (44 mL). Lifestyle Do not use any products that contain  nicotine or tobacco. These products include cigarettes, chewing tobacco, and vaping devices, such as e-cigarettes. If you need help quitting, ask your health care provider. Do not use street drugs. Do not share needles. Ask your health care provider for help if you need support or information about quitting drugs. General instructions Schedule regular health, dental, and eye exams. Stay current with your vaccines. Tell your health care provider if: You often feel depressed. You have ever been abused or do not feel safe at home. Summary Adopting a healthy lifestyle and getting preventive care are important in promoting health and wellness. Follow your health care provider's instructions about healthy diet, exercising, and getting tested or screened for diseases. Follow your health care provider's instructions on monitoring your cholesterol and blood pressure. This information is not intended to replace advice given to you by your health care provider. Make sure you discuss any questions you have with your health care provider. Document Revised: 05/21/2020 Document Reviewed: 05/21/2020 Elsevier Patient Education  [place], MD [facility]

## 2021-11-14 NOTE — Patient Instructions (Signed)

## 2021-11-14 NOTE — Telephone Encounter (Signed)
Called [facility] to update patient medication instructions

## 2021-11-14 NOTE — Assessment & Plan Note (Signed)
Stable.  Lexapro 10 mg daily working very well for her.

## 2021-11-18 ENCOUNTER — Encounter: Payer: Self-pay | Admitting: Emergency Medicine

## 2021-11-18 DIAGNOSIS — E119 Type 2 diabetes mellitus without complications: Secondary | ICD-10-CM | POA: Diagnosis not present

## 2021-11-20 NOTE — Telephone Encounter (Signed)
Paper PA for Greggory Keen has been faxed , awaiting response

## 2021-11-26 NOTE — Telephone Encounter (Signed)
Find out what other medications are approved by her medical insurance

## 2021-11-27 MED ORDER — SEMAGLUTIDE (1 MG/DOSE) 4 MG/3ML ~~LOC~~ SOPN: 1.0000 mg | PEN_INJECTOR | SUBCUTANEOUS | 3 refills | Status: DC

## 2021-11-27 NOTE — Telephone Encounter (Signed)
Okay to request Ozempic 1 mg weekly

## 2021-11-27 NOTE — Addendum Note (Signed)
Addended by: Jerrell Belfast on: 11/27/2021 11:28 AM   Modules accepted: Orders

## 2021-11-28 MED ORDER — SEMAGLUTIDE (2 MG/DOSE) 8 MG/3ML ~~LOC~~ SOPN: 2.0000 mg | PEN_INJECTOR | SUBCUTANEOUS | 1 refills | Status: DC

## 2021-11-28 NOTE — Telephone Encounter (Addendum)
Patient called to follow up and asked for it to be sent in for 2mg  per week for 60d/s so she can get 2 pens. of [place]

## 2021-11-28 NOTE — Addendum Note (Signed)
Addended by: Corwin Levins on: 11/28/2021 12:07 PM   Modules accepted: Orders

## 2022-02-16 NOTE — Progress Notes (Unsigned)
Cardiology Office Note:   Date:  02/19/2022  NAME:  Pamela Duarte    MRN: [id number] DOB:  1985-09-11   PCP:  Horald Pollen, MD  Cardiologist:  None  Electrophysiologist:  None   Referring MD: Horald Pollen, *   Chief Complaint  Patient presents with   Follow-up        History of Present Illness:   Pamela Duarte is a 37 y.o. female with a hx of pAF, HTN, HLD, DM who presents for follow-up.  She reports she is feeling well.  No A-fib captured on her Apple Watch.  Her monitor did not detect any atrial fibrillation.  This seems to have been an isolated episode.  She has had no palpitations.  Her A1c is much improved.  Her cholesterol level is at goal.  She wishes to hold her Eliquis.  I think this is reasonable given a very isolated episode.  She understands the risk.  She will continue to monitor her rhythm and let us know how it goes.  Her echo was normal.  She is very young.  Problem list 1.  Diabetes -A1c 5.9 -T chol 98, HDL 45, LDL 40, triglycerides 61 2.  Hypertension 3.  Obesity 4.  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   Hyperlipidemia    Hypertension    PCOS (polycystic ovarian syndrome)     Past Surgical History: Past Surgical History:  Procedure Laterality Date   ABDOMINOPLASTY  2015   CESAREAN SECTION N/A 03/30/2019   Procedure: CESAREAN SECTION;  Surgeon: Paula Compton, MD;  Location: [facility] LD ORS;  Service: Obstetrics;  Laterality: N/A;   CESAREAN SECTION N/A 10/14/2020   Procedure: CESAREAN SECTION;  Surgeon: Cheri Fowler, MD;  Location: [facility] ORS;  Service: Obstetrics;  Laterality: N/A;   COSMETIC SURGERY      Current Medications: Current Meds  Medication Sig   atorvastatin (LIPITOR) 10 MG tablet Take 1 tablet (10 mg total) by mouth daily.    Continuous Blood Gluc Sensor (FREESTYLE LIBRE 14 DAY SENSOR) MISC 1 application  by Does not apply route daily.   escitalopram (LEXAPRO) 10 MG tablet Take 1 tablet (10 mg total) by mouth daily.   glucose blood (FREESTYLE TEST STRIPS) test strip Use as instructed Dx code: E11.9-Dispense Freestyle Libre test strips   metFORMIN (GLUCOPHAGE-XR) 500 MG 24 hr tablet TAKE 2 TABLETS BY MOUTH TWICE  DAILY   oxyCODONE (OXY IR/ROXICODONE) 5 MG immediate release tablet Take 1 tablet (5 mg total) by mouth every 6 (six) hours as needed for severe pain.   Semaglutide, 2 MG/DOSE, 8 MG/3ML SOPN Inject 2 mg as directed once a week.   [DISCONTINUED] apixaban (ELIQUIS) 5 MG TABS tablet TAKE 1 TABLET BY MOUTH TWICE  DAILY   [DISCONTINUED] metoprolol succinate (TOPROL XL) 25 MG 24 hr tablet Take 1 tablet (25 mg total) by mouth daily.     Allergies:    Patient has no known allergies.   Social History: Social History   Socioeconomic History   Marital status: Married    Spouse name: Not on file   Number of children: 1   Years of education: Not on file   Highest education level: Not on file  Occupational History   Not on file  Tobacco Use   Smoking status: Never  Smokeless tobacco: Never  Vaping Use   Vaping Use: Never used  Substance and Sexual Activity   Alcohol use: No   Drug use: No   Sexual activity: Not Currently  Other Topics Concern   Not on file  Social History Narrative   Not on file   Social Determinants of Health   Financial Resource Strain: Not on file  Food Insecurity: Not on file  Transportation Needs: Not on file  Physical Activity: Not on file  Stress: Not on file  Social Connections: Not on file     Family History: The patient's family history includes Cancer in her paternal grandfather; Diabetes in her maternal grandmother, paternal grandfather, and paternal grandmother; Hypertension in her maternal grandmother, mother, paternal grandfather, and paternal  grandmother.  ROS:   All other ROS reviewed and negative. Pertinent positives noted in the HPI.     EKGs/Labs/Other Studies Reviewed:   The following studies were personally reviewed by me today:   [facility]io 09/29/2021 Brief supraventricular tachycardia detected (2 episodes in 7 days; longest duration 1.4 seconds).  Rare ectopy.  No atrial fibrillation.   TTE 09/12/2021  1. Left ventricular ejection fraction, by estimation, is 60 to 65%. Left  ventricular ejection fraction by 3D volume is 61 %. The left ventricle has  normal function. The left ventricle has no regional wall motion  abnormalities. Left ventricular diastolic   parameters were normal.   2. Right ventricular systolic function is normal. The right ventricular  size is normal. There is normal pulmonary artery systolic pressure. The  estimated right ventricular systolic pressure is 95.6 mmHg.   3. The mitral valve is grossly normal. Trivial mitral valve  regurgitation.   4. The aortic valve is tricuspid. Aortic valve regurgitation is not  visualized.   5. The inferior vena cava is normal in size with greater than 50%  respiratory variability, suggesting right atrial pressure of 3 mmHg.   Recent Labs: 08/30/2021: TSH 0.824 11/14/2021: ALT 17; BUN 8; Creatinine, Ser 0.56; Hemoglobin 12.4; Platelets 346.0; Potassium 3.8; Sodium 137   Recent Lipid Panel    Component Value Date/Time   CHOL 98 11/14/2021 1022   CHOL 140 07/26/2019 1119   TRIG 61.0 11/14/2021 1022   HDL 45.00 11/14/2021 1022   HDL 49 07/26/2019 1119   CHOLHDL 2 11/14/2021 1022   VLDL 12.2 11/14/2021 1022   LDLCALC 40 11/14/2021 1022   LDLCALC 79 07/26/2019 1119    Physical Exam:   VS:  BP 132/88   Pulse 97   Ht 5\' 7"  (1.702 m)   Wt 252 lb 6.4 oz (114.5 kg)   SpO2 95%   BMI 39.53 kg/m    Wt Readings from Last 3 Encounters:  02/19/22 252 lb 6.4 oz (114.5 kg)  11/14/21 245 lb 4 oz (111.2 kg)  09/26/21 248 lb 4 oz (112.6 kg)    General: Well  nourished, well developed, in no acute distress Head: Atraumatic, normal size  Eyes: PEERLA, EOMI  Neck: Supple, no JVD Endocrine: No thryomegaly Cardiac: Normal S1, S2; RRR; no murmurs, rubs, or gallops Lungs: Clear to auscultation bilaterally, no wheezing, rhonchi or rales  Abd: Soft, nontender, no hepatomegaly  Ext: No edema, pulses 2+ Musculoskeletal: No deformities, BUE and BLE strength normal and equal Skin: Warm and dry, no rashes   Neuro: Alert and oriented to person, place, time, and situation, CNII-XII grossly intact, no focal deficits  Psych: Normal mood and affect   ASSESSMENT:   Pamela Duarte is  a 37 y.o. female who presents for the following: 1. Paroxysmal atrial fibrillation (HCC)   2. Primary hypertension   3. Mixed hyperlipidemia     PLAN:   1. Paroxysmal atrial fibrillation ([place]) -She had very brief A-fib captured on her Apple Watch that lasted seconds.  Her monitor showed no evidence of A-fib.  She has had no further symptoms.  Her CHA2DS2-VASc is 3 but diabetes is much improved.  Her blood pressure is controlled on medication.  She wishes to come off Eliquis.  Given her young age I think this is reasonable.  We did discuss that anticoagulation is indicated.  She understands the risk of foregoing anticoagulation.  We had a shared discussion and she wishes to hold this moving forward.  Her echo is normal.  She is 37 years of age.  All of her risk factors are well-controlled.  She will see [place] back in 1 year.  She will monitor her heart rhythm with the Apple watch.  2. Primary hypertension -Controlled on no medication.  On metoprolol succinate for A-fib.  BP very normal today without meds.  We will continue metoprolol for A-fib.  3. Mixed hyperlipidemia -She is diabetic.  A1c is 5.9.  She is on Lipitor 10.  LDL at goal.  She will see [place] back in 1 year.  Disposition: Return in about 1 year (around 02/20/2023).  Medication Adjustments/Labs and Tests  Ordered: Current medicines are reviewed at length with the patient today.  Concerns regarding medicines are outlined above.  No orders of the defined types were placed in this encounter.  Meds ordered this encounter  Medications   metoprolol succinate (TOPROL XL) 25 MG 24 hr tablet    Sig: Take 1 tablet (25 mg total) by mouth daily.    Dispense:  90 tablet    Refill:  3    Patient Instructions  Medication Instructions:  STOP Eliquis The current medical regimen is effective;  continue present plan and medications.  *If you need a refill on your cardiac medications before your next appointment, please call your pharmacy*   Follow-Up: At [facility], you and your health needs are our priority.  As part of our continuing mission to provide you with exceptional heart care, we have created designated Provider Care Teams.  These Care Teams include your primary Cardiologist (physician) and Advanced Practice Providers (APPs -  Physician Assistants and Nurse Practitioners) who all work together to provide you with the care you need, when you need it.  We recommend signing up for the patient portal called "MyChart".  Sign up information is provided on this After Visit Summary.  MyChart is used to connect with patients for Virtual Visits (Telemedicine).  Patients are able to view lab/test results, encounter notes, upcoming appointments, etc.  Non-urgent messages can be sent to your provider as well.   To learn more about what you can do with MyChart, go to [email or web address].    Your next appointment:   12 month(s)  Provider:   Eleonore Chiquito, MD    Time Spent with Patient: I have spent a total of 25 minutes with patient reviewing hospital notes, telemetry, EKGs, labs and examining the patient as well as establishing an assessment and plan that was discussed with the patient.  > 50% of time was spent in direct patient care.  Signed, Addison Naegeli. Audie Box, MD, [place]  [address] [place], [place] [address] [phone]  02/19/2022 9:06 AM

## 2022-02-19 ENCOUNTER — Encounter: Payer: Self-pay | Admitting: Cardiovascular Disease

## 2022-02-19 ENCOUNTER — Ambulatory Visit: Payer: BC Managed Care – PPO | Attending: Cardiovascular Disease | Admitting: Cardiovascular Disease

## 2022-02-19 VITALS — BP 132/88 | HR 97 | Ht 67.0 in | Wt 252.4 lb

## 2022-02-19 DIAGNOSIS — E782 Mixed hyperlipidemia: Secondary | ICD-10-CM

## 2022-02-19 DIAGNOSIS — I10 Essential (primary) hypertension: Secondary | ICD-10-CM | POA: Diagnosis not present

## 2022-02-19 DIAGNOSIS — I480 Paroxysmal atrial fibrillation: Secondary | ICD-10-CM

## 2022-02-19 MED ORDER — METOPROLOL SUCCINATE ER 25 MG PO TB24: 25.0000 mg | ORAL_TABLET | Freq: Every day | ORAL | 3 refills | Status: DC

## 2022-02-19 NOTE — Patient Instructions (Signed)
Medication Instructions:  STOP Eliquis The current medical regimen is effective;  continue present plan and medications.  *If you need a refill on your cardiac medications before your next appointment, please call your pharmacy*   Follow-Up: At [facility], you and your health needs are our priority.  As part of our continuing mission to provide you with exceptional heart care, we have created designated Provider Care Teams.  These Care Teams include your primary Cardiologist (physician) and Advanced Practice Providers (APPs -  Physician Assistants and Nurse Practitioners) who all work together to provide you with the care you need, when you need it.  We recommend signing up for the patient portal called "MyChart".  Sign up information is provided on this After Visit Summary.  MyChart is used to connect with patients for Virtual Visits (Telemedicine).  Patients are able to view lab/test results, encounter notes, upcoming appointments, etc.  Non-urgent messages can be sent to your provider as well.   To learn more about what you can do with MyChart, go to [email or web address].    Your next appointment:   12 month(s)  Provider:   Eleonore Chiquito, MD

## 2022-02-23 ENCOUNTER — Encounter: Payer: Self-pay | Admitting: Emergency Medicine

## 2022-02-23 DIAGNOSIS — E118 Type 2 diabetes mellitus with unspecified complications: Secondary | ICD-10-CM

## 2022-02-23 DIAGNOSIS — F4321 Adjustment disorder with depressed mood: Secondary | ICD-10-CM

## 2022-02-24 MED ORDER — SEMAGLUTIDE (2 MG/DOSE) 8 MG/3ML ~~LOC~~ SOPN: 2.0000 mg | PEN_INJECTOR | SUBCUTANEOUS | 0 refills | Status: DC

## 2022-02-24 MED ORDER — ESCITALOPRAM OXALATE 10 MG PO TABS: 10.0000 mg | ORAL_TABLET | Freq: Every day | ORAL | 0 refills | Status: DC

## 2022-02-24 MED ORDER — ATORVASTATIN CALCIUM 10 MG PO TABS: 10.0000 mg | ORAL_TABLET | Freq: Every day | ORAL | 0 refills | Status: DC

## 2022-02-24 MED ORDER — METFORMIN HCL ER 500 MG PO TB24: 1000.0000 mg | ORAL_TABLET | Freq: Two times a day (BID) | ORAL | 0 refills | Status: DC

## 2022-02-26 ENCOUNTER — Encounter: Payer: Self-pay | Admitting: *Deleted

## 2022-02-26 NOTE — Telephone Encounter (Signed)
This encounter was created in error - please disregard.

## 2022-02-27 ENCOUNTER — Encounter: Payer: Self-pay | Admitting: Emergency Medicine

## 2022-02-27 ENCOUNTER — Other Ambulatory Visit: Payer: Self-pay | Admitting: Emergency Medicine

## 2022-02-27 DIAGNOSIS — E1159 Type 2 diabetes mellitus with other circulatory complications: Secondary | ICD-10-CM

## 2022-02-27 MED ORDER — TIRZEPATIDE 7.5 MG/0.5ML ~~LOC~~ SOAJ: 7.5000 mg | SUBCUTANEOUS | 3 refills | Status: DC

## 2022-02-27 NOTE — Telephone Encounter (Signed)
Okay to switch to [facility]

## 2022-02-27 NOTE — Telephone Encounter (Signed)
New prescription for [facility] sent to pharmacy of record, 7.5 mg weekly.  Thanks.

## 2022-02-28 ENCOUNTER — Telehealth: Payer: Self-pay | Admitting: *Deleted

## 2022-02-28 NOTE — Telephone Encounter (Signed)
PA for [facility] submitted, awaiting response Key; [id number]

## 2022-03-06 ENCOUNTER — Encounter: Payer: Self-pay | Admitting: Emergency Medicine

## 2022-05-15 ENCOUNTER — Ambulatory Visit (INDEPENDENT_AMBULATORY_CARE_PROVIDER_SITE_OTHER): Payer: BC Managed Care – PPO | Admitting: Emergency Medicine

## 2022-05-15 VITALS — BP 130/70 | HR 85 | Temp 98.3°F | Wt 263.5 lb

## 2022-05-15 DIAGNOSIS — E1159 Type 2 diabetes mellitus with other circulatory complications: Secondary | ICD-10-CM | POA: Diagnosis not present

## 2022-05-15 DIAGNOSIS — Z113 Encounter for screening for infections with a predominantly sexual mode of transmission: Secondary | ICD-10-CM | POA: Diagnosis not present

## 2022-05-15 DIAGNOSIS — E6609 Other obesity due to excess calories: Secondary | ICD-10-CM

## 2022-05-15 DIAGNOSIS — F4321 Adjustment disorder with depressed mood: Secondary | ICD-10-CM | POA: Diagnosis not present

## 2022-05-15 DIAGNOSIS — Z13220 Encounter for screening for lipoid disorders: Secondary | ICD-10-CM

## 2022-05-15 DIAGNOSIS — Z7985 Long-term (current) use of injectable non-insulin antidiabetic drugs: Secondary | ICD-10-CM | POA: Diagnosis not present

## 2022-05-15 DIAGNOSIS — Z7984 Long term (current) use of oral hypoglycemic drugs: Secondary | ICD-10-CM | POA: Diagnosis not present

## 2022-05-15 DIAGNOSIS — Z1329 Encounter for screening for other suspected endocrine disorder: Secondary | ICD-10-CM

## 2022-05-15 DIAGNOSIS — I152 Hypertension secondary to endocrine disorders: Secondary | ICD-10-CM

## 2022-05-15 DIAGNOSIS — Z111 Encounter for screening for respiratory tuberculosis: Secondary | ICD-10-CM | POA: Diagnosis not present

## 2022-05-15 DIAGNOSIS — Z6839 Body mass index (BMI) 39.0-39.9, adult: Secondary | ICD-10-CM

## 2022-05-15 DIAGNOSIS — I4719 Other supraventricular tachycardia: Secondary | ICD-10-CM

## 2022-05-15 DIAGNOSIS — Z130 Encounter for screening for diseases of the blood and blood-forming organs and certain disorders involving the immune mechanism: Secondary | ICD-10-CM

## 2022-05-15 DIAGNOSIS — E118 Type 2 diabetes mellitus with unspecified complications: Secondary | ICD-10-CM

## 2022-05-15 DIAGNOSIS — Z0001 Encounter for general adult medical examination with abnormal findings: Secondary | ICD-10-CM | POA: Diagnosis not present

## 2022-05-15 LAB — CBC WITH DIFFERENTIAL/PLATELET
Basophils Absolute: 0 10*3/uL (ref 0.0–0.1)
Basophils Relative: 0.3 % (ref 0.0–3.0)
Eosinophils Absolute: 0.2 10*3/uL (ref 0.0–0.7)
Eosinophils Relative: 2.4 % (ref 0.0–5.0)
HCT: 39.2 % (ref 36.0–46.0)
Hemoglobin: 13.1 g/dL (ref 12.0–15.0)
Lymphocytes Relative: 30.2 % (ref 12.0–46.0)
Lymphs Abs: 1.9 10*3/uL (ref 0.7–4.0)
MCHC: 33.3 g/dL (ref 30.0–36.0)
MCV: 87.6 fl (ref 78.0–100.0)
Monocytes Absolute: 0.5 10*3/uL (ref 0.1–1.0)
Monocytes Relative: 7.3 % (ref 3.0–12.0)
Neutro Abs: 3.8 10*3/uL (ref 1.4–7.7)
Neutrophils Relative %: 59.8 % (ref 43.0–77.0)
Platelets: 315 10*3/uL (ref 150.0–400.0)
RBC: 4.47 Mil/uL (ref 3.87–5.11)
RDW: 14.9 % (ref 11.5–15.5)
WBC: 6.4 10*3/uL (ref 4.0–10.5)

## 2022-05-15 LAB — COMPREHENSIVE METABOLIC PANEL
ALT: 20 U/L (ref 0–35)
AST: 17 U/L (ref 0–37)
Albumin: 4.1 g/dL (ref 3.5–5.2)
Alkaline Phosphatase: 54 U/L (ref 39–117)
BUN: 8 mg/dL (ref 6–23)
CO2: 26 mEq/L (ref 19–32)
Calcium: 8.9 mg/dL (ref 8.4–10.5)
Chloride: 100 mEq/L (ref 96–112)
Creatinine, Ser: 0.58 mg/dL (ref 0.40–1.20)
GFR: 114.91 mL/min (ref >60.00)
Glucose, Bld: 133 mg/dL — ABNORMAL HIGH (ref 70–99)
Potassium: 3.8 mEq/L (ref 3.5–5.1)
Sodium: 134 mEq/L — ABNORMAL LOW (ref 135–145)
Total Bilirubin: 0.4 mg/dL (ref 0.2–1.2)
Total Protein: 7.5 g/dL (ref 6.0–8.3)

## 2022-05-15 LAB — LIPID PANEL
Cholesterol: 111 mg/dL (ref 0–200)
HDL: 49.6 mg/dL (ref >39.00)
LDL Cholesterol: 46 mg/dL (ref 0–99)
NonHDL: 60.97
Total CHOL/HDL Ratio: 2
Triglycerides: 73 mg/dL (ref 0.0–149.0)
VLDL: 14.6 mg/dL (ref 0.0–40.0)

## 2022-05-15 LAB — HEMOGLOBIN A1C: Hgb A1c MFr Bld: 7.3 % — ABNORMAL HIGH (ref 4.6–6.5)

## 2022-05-15 LAB — POCT GLYCOSYLATED HEMOGLOBIN (HGB A1C): Hemoglobin A1C: 7.1 % — AB (ref 4.0–5.6)

## 2022-05-15 LAB — VITAMIN D 25 HYDROXY (VIT D DEFICIENCY, FRACTURES): VITD: 32.06 ng/mL (ref 30.00–100.00)

## 2022-05-15 MED ORDER — ESCITALOPRAM OXALATE 20 MG PO TABS
20.0000 mg | ORAL_TABLET | Freq: Every day | ORAL | 3 refills | Status: DC
Start: 2022-05-15 — End: 2022-09-18

## 2022-05-15 MED ORDER — METFORMIN HCL ER 500 MG PO TB24: 1000.0000 mg | ORAL_TABLET | Freq: Two times a day (BID) | ORAL | 0 refills | Status: DC

## 2022-05-15 MED ORDER — TIRZEPATIDE 10 MG/0.5ML ~~LOC~~ SOAJ
10.0000 mg | SUBCUTANEOUS | 3 refills | Status: DC
Start: 2022-05-15 — End: 2022-11-03

## 2022-05-15 NOTE — Assessment & Plan Note (Signed)
Diet and nutrition discussed Benefits of exercise discussed Recommend to decrease amount of daily carbohydrate intake and daily calories and increase amount of plant-based protein in her diet

## 2022-05-15 NOTE — Assessment & Plan Note (Signed)
Elevated blood pressure reading in the office but normal at home Continue metoprolol succinate 25 mg Recently taken off Eliquis.  No arrhythmia issues Elevated hemoglobin A1c in the office today is 7.1 higher than before Has been having troubles getting Mounjaro.  Has been using leftover Ozempic Continues metformin 1000 mg twice a day Cardiovascular risks associated with hypertension and diabetes discussed Diet and nutrition discussed We will follow-up in 3 months.

## 2022-05-15 NOTE — Progress Notes (Signed)
Pamela Duarte 37 y.o.   Chief Complaint  Patient presents with   Annual Exam    No concerns     HISTORY OF PRESENT ILLNESS: This is a 37 y.o. [name] here for annual exam and follow-up of chronic medical conditions. Requesting STD screening.  Asymptomatic Hyper tension.  Normal blood pressure readings at home Taking off Eliquis.  No arrhythmia concerns Diabetes but having trouble getting Mounjaro.  Has been using leftover Ozempic's BP Readings from Last 3 Encounters:  05/15/22 (!) 136/92  02/19/22 132/88  11/14/21 130/70   Lab Results  Component Value Date   HGBA1C 5.9 (A) 11/14/2021   Wt Readings from Last 3 Encounters:  05/15/22 263 lb 8 oz (119.5 kg)  02/19/22 252 lb 6.4 oz (114.5 kg)  11/14/21 245 lb 4 oz (111.2 kg)     HPI   Prior to Admission medications   Medication Sig Start Date End Date Taking? Authorizing Provider  atorvastatin (LIPITOR) 10 MG tablet Take 1 tablet (10 mg total) by mouth daily. 02/24/22 02/19/23 Yes Pamela Duarte, Pamela Kempf, Pamela  Continuous Blood Gluc Sensor (FREESTYLE LIBRE 14 DAY SENSOR) MISC 1 application  by Does not apply route daily. 11/14/21  Yes Imane Burrough, Pamela Kempf, Pamela  escitalopram (LEXAPRO) 10 MG tablet Take 1 tablet (10 mg total) by mouth daily. 02/24/22  Yes Pamela Duarte, Pamela Kempf, Pamela  glucose blood (FREESTYLE TEST STRIPS) test strip Use as instructed Dx code: E11.9-Dispense Freestyle Libre test strips 07/26/17  Yes Duarte, Pamela A, Pamela  metFORMIN (GLUCOPHAGE-XR) 500 MG 24 hr tablet Take 2 tablets (1,000 mg total) by mouth 2 (two) times daily. 02/24/22  Yes Pamela Duarte, Pamela Kempf, Pamela  metoprolol succinate (TOPROL XL) 25 MG 24 hr tablet Take 1 tablet (25 mg total) by mouth daily. 02/19/22  Yes Duarte, Pamela Ramp, Pamela  oxyCODONE (OXY IR/ROXICODONE) 5 MG immediate release tablet Take 1 tablet (5 mg total) by mouth every 6 (six) hours as needed for severe pain. 10/17/20  Yes Duarte, Pamela Roys, Pamela  tirzepatide [facility]) 7.5 MG/0.5ML Pen  Inject 7.5 mg into the skin once a week. 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 type 2   Hyperlipidemia    Hypertension    PCOS (polycystic ovarian syndrome)     Past Surgical History:  Procedure Laterality Date   ABDOMINOPLASTY  2015   CESAREAN SECTION N/A 03/30/2019   Procedure: CESAREAN SECTION;  Surgeon: Huel Cote, Pamela;  Location: [facility] LD ORS;  Service: Obstetrics;  Laterality: N/A;   CESAREAN SECTION N/A 10/14/2020   Procedure: CESAREAN SECTION;  Surgeon: Lavina Hamman, Pamela;  Location: [facility] LD ORS;  Service: Obstetrics;  Laterality: N/A;   COSMETIC SURGERY      Social History   Socioeconomic History   Marital status: Married    Spouse name: Not on file   Number of children: 1   Years of education: Not on file   Highest education level: Doctorate  Occupational History   Not on file  Tobacco Use   Smoking status: Never   Smokeless tobacco: Never  Vaping Use   Vaping Use: Never used  Substance and Sexual Activity   Alcohol use: No   Drug use: No   Sexual activity: Not Currently  Other Topics Concern   Not on file  Social History Narrative   Not on file   Social Determinants of Health   Financial Resource Strain: Low Risk  (05/15/2022)   Overall Financial Resource Strain (CARDIA)    Difficulty of Paying Living Expenses: Not hard at all  Food Insecurity: No Food Insecurity (05/15/2022)   Hunger Vital Sign    Worried About  Running Out of Food in the Last Year: Never true    Ran Out of Food in the Last Year: Never true  Transportation Needs: No Transportation Needs (05/15/2022)   PRAPARE - Administrator, Civil Service (Medical): No    Lack of Transportation (Non-Medical): No  Physical Activity: Insufficiently Active (05/15/2022)   Exercise Vital Sign    Days of Exercise per Week: 2 days    Minutes of Exercise per Session: 10 min  Stress: Stress Concern Present (05/15/2022)   [employer] of Occupational Health - Occupational Stress Questionnaire    Feeling of Stress : To some extent  Social Connections: Moderately Integrated (05/15/2022)   Social Connection and Isolation Panel [NHANES]    Frequency of Communication with Friends and Family: More than three times a week    Frequency of Social Gatherings with Friends and Family: More than three times a week    Attends Religious Services: 1 to 4 times per year    Active Member of [employer] or Organizations: No    Attends Engineer, structural: Not on file    Marital Status: Married  Catering manager Violence: Not on file    Family History  Problem Relation Age of Onset   Diabetes Maternal Grandmother    Hypertension Maternal Grandmother    Diabetes Paternal Grandfather    Hypertension Paternal Grandfather    Cancer Paternal Grandfather    Hypertension Paternal Grandmother    Diabetes Paternal Grandmother    Hypertension Mother      Review of Systems  Constitutional: Negative.  Negative for chills and fever.  HENT: Negative.  Negative for congestion and sore throat.   Respiratory: Negative.  Negative for cough and shortness of breath.   Cardiovascular: Negative.  Negative for chest pain and palpitations.  Gastrointestinal:  Negative for abdominal pain, diarrhea, nausea and vomiting.  Genitourinary: Negative.  Negative for dysuria and hematuria.  Musculoskeletal: Negative.   Skin: Negative.  Negative for rash.  Neurological:  Negative.  Negative for dizziness and headaches.  All other systems reviewed and are negative.   Vitals:   05/15/22 0911  BP: (!) 136/92  Pulse: 85  Temp: 98.3 F (36.8 C)  SpO2: 98%    Physical Exam Vitals reviewed.  Constitutional:      Appearance: Normal appearance.  HENT:     Head: Normocephalic.     Right Ear: Tympanic membrane, ear canal and external ear normal.     Left Ear: Tympanic membrane, ear canal and external ear normal.     Mouth/Throat:     Mouth: Mucous membranes are moist.     Pharynx: Oropharynx is clear.  Eyes:     Extraocular Movements: Extraocular movements intact.     Conjunctiva/sclera: Conjunctivae normal.     Pupils: Pupils  are equal, round, and reactive to light.  Cardiovascular:     Rate and Rhythm: Normal rate and regular rhythm.     Pulses: Normal pulses.     Heart sounds: Normal heart sounds.  Pulmonary:     Effort: Pulmonary effort is normal.     Breath sounds: Normal breath sounds.  Abdominal:     General: Bowel sounds are normal. There is no distension.     Palpations: Abdomen is soft.     Tenderness: There is no abdominal tenderness.  Musculoskeletal:     Cervical back: No tenderness.  Lymphadenopathy:     Cervical: No cervical adenopathy.  Skin:    General: Skin is warm and dry.  Neurological:     General: No focal deficit present.     Mental Status: She is alert and oriented to person, place, and time.  Psychiatric:        Mood and Affect: Mood normal.        Behavior: Behavior normal.    Results for orders placed or performed in visit on 05/15/22 (from the past 24 hour(s))  POCT HgB A1C     Status: Abnormal   Collection Time: 05/15/22 10:01 AM  Result Value Ref Range   Hemoglobin A1C 7.1 (A) 4.0 - 5.6 %   HbA1c POC (<> result, manual entry)     HbA1c, POC (prediabetic range)     HbA1c, POC (controlled diabetic range)       ASSESSMENT & PLAN: Problem List Items Addressed This Visit       Cardiovascular and  Mediastinum   Hypertension associated with diabetes (HCC)    Elevated blood pressure reading in the office but normal at home Continue metoprolol succinate 25 mg Recently taken off Eliquis.  No arrhythmia issues Elevated hemoglobin A1c in the office today is 7.1 higher than before Has been having troubles getting Mounjaro.  Has been using leftover Ozempic Continues metformin 1000 mg twice a day Cardiovascular risks associated with hypertension and diabetes discussed Diet and nutrition discussed We will follow-up in 3 months.      Relevant Medications   metFORMIN (GLUCOPHAGE-XR) 500 MG 24 hr tablet   tirzepatide (MOUNJARO) 10 MG/0.5ML Pen   Other Relevant Orders   POCT HgB A1C   Comprehensive metabolic panel   Hemoglobin A1c   Lipid panel   Atrial tachycardia    Stable.  No concerns.  Off Eliquis Continue metoprolol succinate 25 mg daily Sees cardiologist on a regular basis        Other   Class 2 obesity due to excess calories without serious comorbidity with body mass index (BMI) of 39.0 to 39.9 in adult    Diet and nutrition discussed Benefits of exercise discussed Recommend to decrease amount of daily carbohydrate intake and daily calories and increase amount of plant-based protein in her diet      Relevant Medications   metFORMIN (GLUCOPHAGE-XR) 500 MG 24 hr tablet   tirzepatide (MOUNJARO) 10 MG/0.5ML Pen   Situational depression    Still active.  Recommend to increase Lexapro to 20 mg daily      Relevant Medications   escitalopram (LEXAPRO) 20 MG tablet   Other Visit Diagnoses     Encounter for general adult medical examination with abnormal findings    -  Primary   Relevant Orders   CBC with Differential   Comprehensive metabolic panel   Hemoglobin A1c   Lipid panel   Type 2 diabetes mellitus with complication, without long-term  current use of insulin (HCC)       Relevant Medications   metFORMIN (GLUCOPHAGE-XR) 500 MG 24 hr tablet   tirzepatide (MOUNJARO)  10 MG/0.5ML Pen   Screening for STD (sexually transmitted disease)       Relevant Orders   Hepatitis C antibody screen   HIV antibody   RPR   HSV(herpes simplex vrs) 1+2 ab-IgG   Screening for deficiency anemia       Relevant Orders   CBC with Differential   Screening for lipoid disorders       Relevant Orders   Lipid panel   Screening for endocrine, metabolic and immunity disorder       Relevant Orders   Comprehensive metabolic panel   Hemoglobin A1c   VITAMIN D 25 Hydroxy (Vit-D Deficiency, Fractures)      Modifiable risk factors discussed with patient. Anticipatory guidance according to age provided. The following topics were also discussed: Social Determinants of Health Smoking.  Non-smoker Diet and nutrition and need to decrease amount of daily carbohydrate intake and daily calories and increase amount of plant based protein in her diet Benefits of exercise Cancer family history review Vaccinations review and recommendations Cardiovascular risk assessment and need for blood work Review of chronic medical conditions Review of all medications Mental health including depression and anxiety Fall and accident prevention   Patient Instructions  Health Maintenance, Female Adopting a healthy lifestyle and getting preventive care are important in promoting health and wellness. Ask your health care provider about: The right schedule for you to have regular tests and exams. Things you can do on your own to prevent diseases and keep yourself healthy. What should I know about diet, weight, and exercise? Eat a healthy diet  Eat a diet that includes plenty of vegetables, fruits, low-fat dairy products, and lean protein. Do not eat a lot of foods that are high in solid fats, added sugars, or sodium. Maintain a healthy weight Body mass index (BMI) is used to identify weight problems. It estimates body fat based on height and weight. Your health care provider can help determine  your BMI and help you achieve or maintain a healthy weight. Get regular exercise Get regular exercise. This is one of the most important things you can do for your health. Most adults should: Exercise for at least 150 minutes each week. The exercise should increase your heart rate and make you sweat (moderate-intensity exercise). Do strengthening exercises at least twice a week. This is in addition to the moderate-intensity exercise. Spend less time sitting. Even light physical activity can be beneficial. Watch cholesterol and blood lipids Have your blood tested for lipids and cholesterol at 38 years of age, then have this test every 5 years. Have your cholesterol levels checked more often if: Your lipid or cholesterol levels are high. You are older than 38 years of age. You are at high risk for heart disease. What should I know about cancer screening? Depending on your health history and family history, you may need to have cancer screening at various ages. This may include screening for: Breast cancer. Cervical cancer. Colorectal cancer. Skin cancer. Lung cancer. What should I know about heart disease, diabetes, and high blood pressure? Blood pressure and heart disease High blood pressure causes heart disease and increases the risk of stroke. This is more likely to develop in people who have high blood pressure readings or are overweight. Have your blood pressure checked: Every 3-5 years if you are 18-39 years of  age. Every year if you are 15 years old or older. Diabetes Have regular diabetes screenings. This checks your fasting blood sugar level. Have the screening done: Once every three years after age 33 if you are at a normal weight and have a low risk for diabetes. More often and at a younger age if you are overweight or have a high risk for diabetes. What should I know about preventing infection? Hepatitis B If you have a higher risk for hepatitis B, you should be screened for  this virus. Talk with your health care provider to find out if you are at risk for hepatitis B infection. Hepatitis C Testing is recommended for: Everyone born from [birth year] through 1966. Anyone with known risk factors for hepatitis C. Sexually transmitted infections (STIs) Get screened for STIs, including gonorrhea and chlamydia, if: You are sexually active and are younger than 38 years of age. You are older than 38 years of age and your health care provider tells you that you are at risk for this type of infection. Your sexual activity has changed since you were last screened, and you are at increased risk for chlamydia or gonorrhea. Ask your health care provider if you are at risk. Ask your health care provider about whether you are at high risk for HIV. Your health care provider may recommend a prescription medicine to help prevent HIV infection. If you choose to take medicine to prevent HIV, you should first get tested for HIV. You should then be tested every 3 months for as long as you are taking the medicine. Pregnancy If you are about to stop having your period (premenopausal) and you may become pregnant, seek counseling before you get pregnant. Take 400 to 800 micrograms (mcg) of folic acid every day if you become pregnant. Ask for birth control (contraception) if you want to prevent pregnancy. Osteoporosis and menopause Osteoporosis is a disease in which the bones lose minerals and strength with aging. This can result in bone fractures. If you are 27 years old or older, or if you are at risk for osteoporosis and fractures, ask your health care provider if you should: Be screened for bone loss. Take a calcium or vitamin D supplement to lower your risk of fractures. Be given hormone replacement therapy (HRT) to treat symptoms of menopause. Follow these instructions at home: Alcohol use Do not drink alcohol if: Your health care provider tells you not to drink. You are pregnant, may be  pregnant, or are planning to become pregnant. If you drink alcohol: Limit how much you have to: 0-1 drink a day. Know how much alcohol is in your drink. In the [place] one drink equals one 12 oz bottle of beer (355 mL), one 5 oz glass of wine (148 mL), or one 1 oz glass of hard liquor (44 mL). Lifestyle Do not use any products that contain nicotine or tobacco. These products include cigarettes, chewing tobacco, and vaping devices, such as e-cigarettes. If you need help quitting, ask your health care provider. Do not use street drugs. Do not share needles. Ask your health care provider for help if you need support or information about quitting drugs. General instructions Schedule regular health, dental, and eye exams. Stay current with your vaccines. Tell your health care provider if: You often feel depressed. You have ever been abused or do not feel safe at home. Summary Adopting a healthy lifestyle and getting preventive care are important in promoting health and wellness. Follow your health care provider's  instructions about healthy diet, exercising, and getting tested or screened for diseases. Follow your health care provider's instructions on monitoring your cholesterol and blood pressure. This information is not intended to replace advice given to you by your health care provider. Make sure you discuss any questions you have with your health care provider. Document Revised: 05/21/2020 Document Reviewed: 05/21/2020 Elsevier Patient Education  2023 Elsevier Inc.      Edwina Barth, Pamela [facility]

## 2022-05-15 NOTE — Assessment & Plan Note (Signed)
Stable.  No concerns.  Off Eliquis Continue metoprolol succinate 25 mg daily Sees cardiologist on a regular basis

## 2022-05-15 NOTE — Patient Instructions (Signed)

## 2022-05-15 NOTE — Assessment & Plan Note (Signed)
Still active.  Recommend to increase Lexapro to 20 mg daily

## 2022-05-17 LAB — QUANTIFERON-TB GOLD PLUS
Mitogen-NIL: >10 IU/mL
NIL: 0.04 IU/mL
QuantiFERON-TB Gold Plus: NEGATIVE
TB1-NIL: 0 IU/mL
TB2-NIL: <0 IU/mL

## 2022-05-17 LAB — HEPATITIS C ANTIBODY: Hepatitis C Ab: NONREACTIVE

## 2022-05-17 LAB — HIV ANTIBODY (ROUTINE TESTING W REFLEX): HIV 1&2 Ab, 4th Generation: NONREACTIVE

## 2022-05-17 LAB — HSV(HERPES SIMPLEX VRS) I + II AB-IGG
HAV 1 IGG,TYPE SPECIFIC AB: 36 index — ABNORMAL HIGH
HSV 2 IGG,TYPE SPECIFIC AB: <0.9 index

## 2022-05-17 LAB — RPR: RPR Ser Ql: NONREACTIVE

## 2022-05-28 ENCOUNTER — Encounter: Payer: Self-pay | Admitting: Emergency Medicine

## 2022-05-29 ENCOUNTER — Telehealth: Payer: Self-pay

## 2022-05-29 NOTE — Telephone Encounter (Signed)
PA submitted, see additional encounter for updates.  

## 2022-05-29 NOTE — Telephone Encounter (Signed)
Patient needs PA for [facility] resubmitted to [employer]. Thank you

## 2022-05-29 NOTE — Telephone Encounter (Signed)
PA initiated via Covermymeds; KEY: [id number]. Awaiting determination.

## 2022-06-12 NOTE — Telephone Encounter (Signed)
Outcome unknown.   

## 2022-06-13 ENCOUNTER — Other Ambulatory Visit (HOSPITAL_COMMUNITY): Payer: Self-pay

## 2022-06-13 NOTE — Telephone Encounter (Signed)
Patient requesting a update on her PA . I seen in the chart that is states status outcome unknown .Marland KitchenMarland Kitchen

## 2022-06-16 NOTE — Telephone Encounter (Signed)
Please advise, requesting an update on patient [place] PA

## 2022-06-17 ENCOUNTER — Other Ambulatory Visit (HOSPITAL_COMMUNITY): Payer: Self-pay

## 2022-06-17 ENCOUNTER — Other Ambulatory Visit: Payer: Self-pay | Admitting: Emergency Medicine

## 2022-06-17 NOTE — Telephone Encounter (Signed)
Per previous encounters, appeal was approved in February, per test claim, medication should go through for free of charge, called pharmacy, they received a rejection stating that "cost exceeds maximum", called [employer] back, and Ronnie told me that she will give them a call because they should be able to run the medication through with an override and receive a paid claim. Pharmacy will notify pt when they receive a successful claim.

## 2022-06-17 NOTE — Telephone Encounter (Signed)
Placed call to [employer] insurance and was directed to a downloadable PA form. Faxing back and documenting in separate encounter.

## 2022-06-18 MED ORDER — ATORVASTATIN CALCIUM 10 MG PO TABS: 10.0000 mg | ORAL_TABLET | Freq: Every day | ORAL | 3 refills | Status: DC

## 2022-06-19 ENCOUNTER — Other Ambulatory Visit (HOSPITAL_COMMUNITY): Payer: Self-pay

## 2022-09-17 ENCOUNTER — Encounter: Payer: Self-pay | Admitting: Emergency Medicine

## 2022-09-17 DIAGNOSIS — F4321 Adjustment disorder with depressed mood: Secondary | ICD-10-CM

## 2022-09-18 MED ORDER — METOPROLOL SUCCINATE ER 25 MG PO TB24: 25.0000 mg | ORAL_TABLET | Freq: Every day | ORAL | 3 refills | Status: DC

## 2022-09-18 MED ORDER — ESCITALOPRAM OXALATE 20 MG PO TABS
20.0000 mg | ORAL_TABLET | Freq: Every day | ORAL | 3 refills | Status: DC
Start: 2022-09-18 — End: 2023-10-04

## 2022-09-18 NOTE — Telephone Encounter (Signed)
Okay to send prescriptions as requested

## 2022-10-24 ENCOUNTER — Telehealth: Payer: Self-pay | Admitting: Pharmacy Technician

## 2022-10-24 ENCOUNTER — Other Ambulatory Visit (HOSPITAL_COMMUNITY): Payer: Self-pay

## 2022-10-24 NOTE — Telephone Encounter (Addendum)
Pharmacy Patient Advocate Encounter   Received notification from CoverMyMeds that prior authorization for mounjaro is required/requested.   Insurance verification completed.   The patient is insured through  [employer]  .   Per test claim: PA required; PA submitted to [employer] via CoverMyMeds Key/confirmation #/EOC [id number] Status is pending

## 2022-11-03 ENCOUNTER — Ambulatory Visit (INDEPENDENT_AMBULATORY_CARE_PROVIDER_SITE_OTHER): Payer: BC Managed Care – PPO | Admitting: Emergency Medicine

## 2022-11-03 ENCOUNTER — Telehealth: Payer: Self-pay | Admitting: *Deleted

## 2022-11-03 VITALS — BP 120/70 | HR 84 | Temp 98.4°F | Ht 67.0 in | Wt 264.2 lb

## 2022-11-03 DIAGNOSIS — I152 Hypertension secondary to endocrine disorders: Secondary | ICD-10-CM | POA: Diagnosis not present

## 2022-11-03 DIAGNOSIS — I4719 Other supraventricular tachycardia: Secondary | ICD-10-CM | POA: Diagnosis not present

## 2022-11-03 DIAGNOSIS — F4321 Adjustment disorder with depressed mood: Secondary | ICD-10-CM

## 2022-11-03 DIAGNOSIS — E66812 Obesity, class 2: Secondary | ICD-10-CM

## 2022-11-03 DIAGNOSIS — Z23 Encounter for immunization: Secondary | ICD-10-CM | POA: Diagnosis not present

## 2022-11-03 DIAGNOSIS — E1159 Type 2 diabetes mellitus with other circulatory complications: Secondary | ICD-10-CM

## 2022-11-03 DIAGNOSIS — E6609 Other obesity due to excess calories: Secondary | ICD-10-CM

## 2022-11-03 DIAGNOSIS — Z6839 Body mass index (BMI) 39.0-39.9, adult: Secondary | ICD-10-CM

## 2022-11-03 DIAGNOSIS — Z7985 Long-term (current) use of injectable non-insulin antidiabetic drugs: Secondary | ICD-10-CM | POA: Diagnosis not present

## 2022-11-03 LAB — POCT GLYCOSYLATED HEMOGLOBIN (HGB A1C): Hemoglobin A1C: 8.3 % — AB (ref 4.0–5.6)

## 2022-11-03 MED ORDER — DAPAGLIFLOZIN PROPANEDIOL 10 MG PO TABS: 10.0000 mg | ORAL_TABLET | Freq: Every day | ORAL | 3 refills | Status: DC

## 2022-11-03 MED ORDER — METOPROLOL SUCCINATE ER 50 MG PO TB24
50.0000 mg | ORAL_TABLET | Freq: Every day | ORAL | 3 refills | Status: DC
Start: 2022-11-03 — End: 2023-10-11

## 2022-11-03 MED ORDER — TIRZEPATIDE 15 MG/0.5ML ~~LOC~~ SOAJ: 15.0000 mg | SUBCUTANEOUS | 5 refills | Status: DC

## 2022-11-03 NOTE — Assessment & Plan Note (Signed)
Increased dose of Lexapro working very well Continue Lexapro 20 mg daily

## 2022-11-03 NOTE — Progress Notes (Signed)
Pamela Duarte 37 y.o.   Chief Complaint  Patient presents with   Medical Management of Chronic Issues    F/u appt DM , no concerns , oatient wants a increase in her Mounjaro and Metoprolol     HISTORY OF PRESENT ILLNESS: This is a 37 y.o. female here for follow-up of hypertension and diabetes Overall doing well. Elevated blood pressure readings at home.  Recent hemoglobin A1c is 7.8 Wants to increase dose of Mounjaro and metoprolol Metformin causing chronic GI side effects No other complaints or medical concerns today. Wt Readings from Last 3 Encounters:  11/03/22 264 lb 4 oz (119.9 kg)  05/15/22 263 lb 8 oz (119.5 kg)  02/19/22 252 lb 6.4 oz (114.5 kg)   BP Readings from Last 3 Encounters:  11/03/22 (!) 138/90  05/15/22 130/70  02/19/22 132/88   Lab Results  Component Value Date   HGBA1C 7.3 (H) 05/15/2022     HPI   Prior to Admission medications   Medication Sig Start Date End Date Taking? Authorizing Provider  atorvastatin (LIPITOR) 10 MG tablet Take 1 tablet (10 mg total) by mouth daily. 06/18/22 06/13/23 Yes Shona Pardo, Eilleen Kempf, MD  Continuous Blood Gluc Sensor (FREESTYLE LIBRE 14 DAY SENSOR) MISC 1 application  by Does not apply route daily. 11/14/21  Yes Etrulia Zarr, Eilleen Kempf, MD  escitalopram (LEXAPRO) 20 MG tablet Take 1 tablet (20 mg total) by mouth daily. 09/18/22  Yes Dierdra Salameh, Eilleen Kempf, MD  glucose blood (FREESTYLE TEST STRIPS) test strip Use as instructed Dx code: E11.9-Dispense Freestyle Libre test strips 07/26/17  Yes Stallings, Zoe A, MD  metFORMIN (GLUCOPHAGE-XR) 500 MG 24 hr tablet Take 2 tablets (1,000 mg total) by mouth 2 (two) times daily. 05/15/22  Yes Shawnetta Lein, Eilleen Kempf, MD  metoprolol succinate (TOPROL XL) 25 MG 24 hr tablet Take 1 tablet (25 mg total) by mouth daily. 09/18/22  Yes Keshawna Dix, Eilleen Kempf, MD  oxyCODONE (OXY IR/ROXICODONE) 5 MG immediate release tablet Take 1 tablet (5 mg total) by mouth every 6 (six) hours as needed for severe  pain. 10/17/20  Yes Shivaji, Valerie Roys, MD  tirzepatide [facility]) 10 MG/0.5ML Pen Inject 10 mg into the skin once a week. 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   Hyperlipidemia    Hypertension    PCOS (polycystic ovarian syndrome)     Past Surgical History:  Procedure Laterality Date   ABDOMINOPLASTY  2015   CESAREAN SECTION N/A 03/30/2019   Procedure: CESAREAN SECTION;  Surgeon: Huel Cote, MD;  Location: [facility] LD ORS;  Service: Obstetrics;  Laterality: N/A;   CESAREAN SECTION N/A 10/14/2020   Procedure: CESAREAN SECTION;  Surgeon: Lavina Hamman, MD;  Location: [facility] ORS;  Service: Obstetrics;  Laterality: N/A;   COSMETIC SURGERY      Social History   Socioeconomic History   Marital status: Married    Spouse name: Not on file   Number  of children: 1   Years of education: Not on file   Highest education level: Master's degree (e.g., MA, MS, MEng, MEd, MSW, MBA)  Occupational History   Not on file  Tobacco Use   Smoking status: Never   Smokeless tobacco: Never  Vaping Use   Vaping status: Never Used  Substance and Sexual Activity   Alcohol use: No   Drug use: No   Sexual activity: Not Currently  Other Topics Concern   Not on file  Social History Narrative   Not on file   Social Determinants of Health   Financial Resource Strain: Low Risk  (11/03/2022)   Overall Financial Resource Strain (CARDIA)    Difficulty of  Paying Living Expenses: Not hard at all  Food Insecurity: No Food Insecurity (11/03/2022)   Hunger Vital Sign    Worried About Running Out of Food in the Last Year: Never true    Ran Out of Food in the Last Year: Never true  Transportation Needs: No Transportation Needs (11/03/2022)   PRAPARE - Administrator, Civil Service (Medical): No    Lack of Transportation (Non-Medical): No  Physical Activity: Insufficiently Active (11/03/2022)   Exercise Vital Sign    Days of Exercise per Week: 3 days    Minutes of Exercise per Session: 20 min  Stress: Stress Concern Present (11/03/2022)   [employer] of Occupational Health - Occupational Stress Questionnaire    Feeling of Stress : To some extent  Social Connections: Socially Integrated (11/03/2022)   Social Connection and Isolation Panel [NHANES]    Frequency of Communication with Friends and Family: More than three times a week    Frequency of Social Gatherings with Friends and Family: More than three times a week    Attends Religious Services: 1 to 4 times per year    Active Member of [employer] or Organizations: Yes    Attends Banker Meetings: 1 to 4 times per year    Marital Status: Married  Catering manager Violence: Not on file    Family History  Problem Relation Age of Onset   Diabetes Maternal Grandmother    Hypertension Maternal Grandmother    Diabetes Paternal Grandfather    Hypertension Paternal Grandfather    Cancer Paternal Grandfather    Hypertension Paternal Grandmother    Diabetes Paternal Grandmother    Hypertension Mother      Review of Systems  Constitutional: Negative.  Negative for chills and fever.  HENT: Negative.  Negative for congestion and sore throat.   Respiratory: Negative.  Negative for cough and shortness of breath.   Cardiovascular: Negative.  Negative for chest pain and palpitations.  Gastrointestinal:  Negative for abdominal pain, diarrhea, nausea and vomiting.   Genitourinary: Negative.  Negative for dysuria and hematuria.  Skin: Negative.  Negative for rash.  Neurological: Negative.  Negative for dizziness and headaches.  All other systems reviewed and are negative.   Vitals:   11/03/22 0843  BP: (!) 138/90  Pulse: 84  Temp: 98.4 F (36.9 C)  SpO2: 97%    Physical Exam Vitals reviewed.  Constitutional:      Appearance: Normal appearance.  HENT:     Head: Normocephalic.  Eyes:     Extraocular Movements: Extraocular movements intact.     Pupils: Pupils are equal, round, and reactive to light.  Cardiovascular:     Rate and Rhythm: Normal rate and regular rhythm.     Pulses: Normal pulses.  Heart sounds: Normal heart sounds.  Pulmonary:     Effort: Pulmonary effort is normal.     Breath sounds: Normal breath sounds.  Musculoskeletal:     Cervical back: No tenderness.  Lymphadenopathy:     Cervical: No cervical adenopathy.  Skin:    General: Skin is warm and dry.     Capillary Refill: Capillary refill takes less than 2 seconds.  Neurological:     General: No focal deficit present.     Mental Status: She is alert and oriented to person, place, and time.  Psychiatric:        Mood and Affect: Mood normal.        Behavior: Behavior normal.    Results for orders placed or performed in visit on 11/03/22 (from the past 24 hour(s))  POCT HgB A1C     Status: Abnormal   Collection Time: 11/03/22  9:18 AM  Result Value Ref Range   Hemoglobin A1C 8.3 (A) 4.0 - 5.6 %   HbA1c POC (<> result, manual entry)     HbA1c, POC (prediabetic range)     HbA1c, POC (controlled diabetic range)       ASSESSMENT & PLAN: A total of 43 minutes was spent with the patient and counseling/coordination of care regarding preparing for this visit, review of most recent office visit notes, review of multiple chronic medical conditions under management, review of most recent blood work results including interpretation of today's hemoglobin A1c, review of  all medications and changes made, education on nutrition, cardiovascular risks associated with hypertension and diabetes, review of health maintenance items, prognosis, documentation, and need for follow-up.  Problem List Items Addressed This Visit       Cardiovascular and Mediastinum   Hypertension associated with diabetes (HCC) - Primary    Elevated blood pressure reading in the office and at work Patient prefers to increase dose of metoprolol succinate to 50 mg daily Advised to monitor blood pressure readings at home daily for the next several weeks and if persistently elevated consider starting ARB Cardiovascular risks associated with hypertension discussed. Uncontrolled diabetes with hemoglobin A1c at 8.3, higher than before. Metformin causing GI side effects Recommend increased Mounjaro weekly dose to 50 mg, stop metformin, and start Farxiga 10 mg daily Diet and nutrition discussed Follow-up in 3 months      Relevant Medications   dapagliflozin propanediol (FARXIGA) 10 MG TABS tablet   metoprolol succinate (TOPROL-XL) 50 MG 24 hr tablet   tirzepatide (MOUNJARO) 15 MG/0.5ML Pen   Atrial tachycardia (HCC)    Asymptomatic.  No concerns      Relevant Medications   metoprolol succinate (TOPROL-XL) 50 MG 24 hr tablet     Other   Class 2 obesity due to excess calories without serious comorbidity with body mass index (BMI) of 39.0 to 39.9 in adult    Diet and nutrition discussed Benefit of exercise discussed Advised to decrease amount of daily carbohydrate intake and daily calories and increase amount of plant-based protein in her diet      Relevant Medications   dapagliflozin propanediol (FARXIGA) 10 MG TABS tablet   tirzepatide (MOUNJARO) 15 MG/0.5ML Pen   Situational depression    Increased dose of Lexapro working very well Continue Lexapro 20 mg daily      Other Visit Diagnoses     Need for vaccination       Relevant Orders   Flu vaccine trivalent PF, 6mos and  older(Flulaval,Afluria,Fluarix,Fluzone)      Patient Instructions  Stop metformin Increase metoprolol succinate to 50 mg daily Increase weekly Mounjaro to 15 mg Start Farxiga 10 mg daily  Diabetes Mellitus and Nutrition, Adult When you have diabetes, or diabetes mellitus, it is very important to have healthy eating habits because your blood sugar (glucose) levels are greatly affected by what you eat and drink. Eating healthy foods in the right amounts, at about the same times every day, can help you: Manage your blood glucose. Lower your risk of heart disease. Improve your blood pressure. Reach or maintain a healthy weight. What can affect my meal plan? Every person with diabetes is different, and each person has different needs for a meal plan. Your health care provider may recommend that you work with a dietitian to make a meal plan that is best for you. Your meal plan may vary depending on factors such as: The calories you need. The medicines you take. Your weight. Your blood glucose, blood pressure, and cholesterol levels. Your activity level. Other health conditions you have, such as heart or kidney disease. How do carbohydrates affect me? Carbohydrates, also called carbs, affect your blood glucose level more than any other type of food. Eating carbs raises the amount of glucose in your blood. It is important to know how many carbs you can safely have in each meal. This is different for every person. Your dietitian can help you calculate how many carbs you should have at each meal and for each snack. How does alcohol affect me? Alcohol can cause a decrease in blood glucose (hypoglycemia), especially if you use insulin or take certain diabetes medicines by mouth. Hypoglycemia can be a life-threatening condition. Symptoms of hypoglycemia, such as sleepiness, dizziness, and confusion, are similar to symptoms of having too much alcohol. Do not drink alcohol if: Your health care  provider tells you not to drink. You are pregnant, may be pregnant, or are planning to become pregnant. If you drink alcohol: Limit how much you have to: 0-1 drink a day for women. 0-2 drinks a day for men. Know how much alcohol is in your drink. In the [place] one drink equals one 12 oz bottle of beer (355 mL), one 5 oz glass of wine (148 mL), or one 1 oz glass of hard liquor (44 mL). Keep yourself hydrated with water, diet soda, or unsweetened iced tea. Keep in mind that regular soda, juice, and other mixers may contain a lot of sugar and must be counted as carbs. What are tips for following this plan?  Reading food labels Start by checking the serving size on the Nutrition Facts label of packaged foods and drinks. The number of calories and the amount of carbs, fats, and other nutrients listed on the label are based on one serving of the item. Many items contain more than one serving per package. Check the total grams (g) of carbs in one serving. Check the number of grams of saturated fats and trans fats in one serving. Choose foods that have a low amount or none of these fats. Check the number of milligrams (mg) of salt (sodium) in one serving. Most people should limit total sodium intake to less than 2,300 mg per day. Always check the nutrition information of foods labeled as "low-fat" or "nonfat." These foods may be higher in added sugar or refined carbs and should be avoided. Talk to your dietitian to identify your daily goals for nutrients listed on the label. Shopping Avoid buying canned, pre-made, or processed foods. These foods tend to  be high in fat, sodium, and added sugar. Shop around the outside edge of the grocery store. This is where you will most often find fresh fruits and vegetables, bulk grains, fresh meats, and fresh dairy products. Cooking Use low-heat cooking methods, such as baking, instead of high-heat cooking methods, such as deep frying. Cook using healthy oils, such  as olive, canola, or sunflower oil. Avoid cooking with butter, cream, or high-fat meats. Meal planning Eat meals and snacks regularly, preferably at the same times every day. Avoid going long periods of time without eating. Eat foods that are high in fiber, such as fresh fruits, vegetables, beans, and whole grains. Eat 4-6 oz (112-168 g) of lean protein each day, such as lean meat, chicken, fish, eggs, or tofu. One ounce (oz) (28 g) of lean protein is equal to: 1 oz (28 g) of meat, chicken, or fish. 1 egg.  cup (62 g) of tofu. Eat some foods each day that contain healthy fats, such as avocado, nuts, seeds, and fish. What foods should I eat? Fruits Berries. Apples. Oranges. Peaches. Apricots. Plums. Grapes. Mangoes. Papayas. Pomegranates. Kiwi. Cherries. Vegetables Leafy greens, including lettuce, spinach, kale, chard, collard greens, mustard greens, and cabbage. Beets. Cauliflower. Broccoli. Carrots. Green beans. Tomatoes. Peppers. Onions. Cucumbers. Brussels sprouts. Grains Whole grains, such as whole-wheat or whole-grain bread, crackers, tortillas, cereal, and pasta. Unsweetened oatmeal. Quinoa. Brown or wild rice. Meats and other proteins Seafood. Poultry without skin. Lean cuts of poultry and beef. Tofu. Nuts. Seeds. Dairy Low-fat or fat-free dairy products such as milk, yogurt, and cheese. The items listed above may not be a complete list of foods and beverages you can eat and drink. Contact a dietitian for more information. What foods should I avoid? Fruits Fruits canned with syrup. Vegetables Canned vegetables. Frozen vegetables with butter or cream sauce. Grains Refined white flour and flour products such as bread, pasta, snack foods, and cereals. Avoid all processed foods. Meats and other proteins Fatty cuts of meat. Poultry with skin. Breaded or fried meats. Processed meat. Avoid saturated fats. Dairy Full-fat yogurt, cheese, or milk. Beverages Sweetened drinks, such as  soda or iced tea. The items listed above may not be a complete list of foods and beverages you should avoid. Contact a dietitian for more information. Questions to ask a health care provider Do I need to meet with a certified diabetes care and education specialist? Do I need to meet with a dietitian? What number can I call if I have questions? When are the best times to check my blood glucose? Where to find more information: American Diabetes Association: diabetes.org Academy of Nutrition and Dietetics: eatright.[employer] of Diabetes and Digestive and Kidney Diseases: StageSync.si Association of Diabetes Care & Education Specialists: diabeteseducator.org Summary It is important to have healthy eating habits because your blood sugar (glucose) levels are greatly affected by what you eat and drink. It is important to use alcohol carefully. A healthy meal plan will help you manage your blood glucose and lower your risk of heart disease. Your health care provider may recommend that you work with a dietitian to make a meal plan that is best for you. This information is not intended to replace advice given to you by your health care provider. Make sure you discuss any questions you have with your health care provider. Document Revised: 08/03/2019 Document Reviewed: 08/03/2019 Elsevier Patient Education  2024 Elsevier Inc.      Edwina Barth, MD [facility]

## 2022-11-03 NOTE — Assessment & Plan Note (Signed)
Elevated blood pressure reading in the office and at work Patient prefers to increase dose of metoprolol succinate to 50 mg daily Advised to monitor blood pressure readings at home daily for the next several weeks and if persistently elevated consider starting ARB Cardiovascular risks associated with hypertension discussed. Uncontrolled diabetes with hemoglobin A1c at 8.3, higher than before. Metformin causing GI side effects Recommend increased Mounjaro weekly dose to 50 mg, stop metformin, and start Farxiga 10 mg daily Diet and nutrition discussed Follow-up in 3 months

## 2022-11-03 NOTE — Assessment & Plan Note (Signed)
Asymptomatic.  No concerns. 

## 2022-11-03 NOTE — Telephone Encounter (Signed)
Patient needs a PA for Mounjaro 15mg  and Farxiga through [facility].

## 2022-11-03 NOTE — Patient Instructions (Signed)
Stop metformin Increase metoprolol succinate to 50 mg daily Increase weekly Mounjaro to 15 mg Start Farxiga 10 mg daily  Diabetes Mellitus and Nutrition, Adult When you have diabetes, or diabetes mellitus, it is very important to have healthy eating habits because your blood sugar (glucose) levels are greatly affected by what you eat and drink. Eating healthy foods in the right amounts, at about the same times every day, can help you: Manage your blood glucose. Lower your risk of heart disease. Improve your blood pressure. Reach or maintain a healthy weight. What can affect my meal plan? Every person with diabetes is different, and each person has different needs for a meal plan. Your health care provider may recommend that you work with a dietitian to make a meal plan that is best for you. Your meal plan may vary depending on factors such as: The calories you need. The medicines you take. Your weight. Your blood glucose, blood pressure, and cholesterol levels. Your activity level. Other health conditions you have, such as heart or kidney disease. How do carbohydrates affect me? Carbohydrates, also called carbs, affect your blood glucose level more than any other type of food. Eating carbs raises the amount of glucose in your blood. It is important to know how many carbs you can safely have in each meal. This is different for every person. Your dietitian can help you calculate how many carbs you should have at each meal and for each snack. How does alcohol affect me? Alcohol can cause a decrease in blood glucose (hypoglycemia), especially if you use insulin or take certain diabetes medicines by mouth. Hypoglycemia can be a life-threatening condition. Symptoms of hypoglycemia, such as sleepiness, dizziness, and confusion, are similar to symptoms of having too much alcohol. Do not drink alcohol if: Your health care provider tells you not to drink. You are pregnant, may be pregnant, or are  planning to become pregnant. If you drink alcohol: Limit how much you have to: 0-1 drink a day for women. 0-2 drinks a day for men. Know how much alcohol is in your drink. In the [place] one drink equals one 12 oz bottle of beer (355 mL), one 5 oz glass of wine (148 mL), or one 1 oz glass of hard liquor (44 mL). Keep yourself hydrated with water, diet soda, or unsweetened iced tea. Keep in mind that regular soda, juice, and other mixers may contain a lot of sugar and must be counted as carbs. What are tips for following this plan?  Reading food labels Start by checking the serving size on the Nutrition Facts label of packaged foods and drinks. The number of calories and the amount of carbs, fats, and other nutrients listed on the label are based on one serving of the item. Many items contain more than one serving per package. Check the total grams (g) of carbs in one serving. Check the number of grams of saturated fats and trans fats in one serving. Choose foods that have a low amount or none of these fats. Check the number of milligrams (mg) of salt (sodium) in one serving. Most people should limit total sodium intake to less than 2,300 mg per day. Always check the nutrition information of foods labeled as "low-fat" or "nonfat." These foods may be higher in added sugar or refined carbs and should be avoided. Talk to your dietitian to identify your daily goals for nutrients listed on the label. Shopping Avoid buying canned, pre-made, or processed foods. These foods tend to  be high in fat, sodium, and added sugar. Shop around the outside edge of the grocery store. This is where you will most often find fresh fruits and vegetables, bulk grains, fresh meats, and fresh dairy products. Cooking Use low-heat cooking methods, such as baking, instead of high-heat cooking methods, such as deep frying. Cook using healthy oils, such as olive, canola, or sunflower oil. Avoid cooking with butter, cream, or  high-fat meats. Meal planning Eat meals and snacks regularly, preferably at the same times every day. Avoid going long periods of time without eating. Eat foods that are high in fiber, such as fresh fruits, vegetables, beans, and whole grains. Eat 4-6 oz (112-168 g) of lean protein each day, such as lean meat, chicken, fish, eggs, or tofu. One ounce (oz) (28 g) of lean protein is equal to: 1 oz (28 g) of meat, chicken, or fish. 1 egg.  cup (62 g) of tofu. Eat some foods each day that contain healthy fats, such as avocado, nuts, seeds, and fish. What foods should I eat? Fruits Berries. Apples. Oranges. Peaches. Apricots. Plums. Grapes. Mangoes. Papayas. Pomegranates. Kiwi. Cherries. Vegetables Leafy greens, including lettuce, spinach, kale, chard, collard greens, mustard greens, and cabbage. Beets. Cauliflower. Broccoli. Carrots. Green beans. Tomatoes. Peppers. Onions. Cucumbers. Brussels sprouts. Grains Whole grains, such as whole-wheat or whole-grain bread, crackers, tortillas, cereal, and pasta. Unsweetened oatmeal. Quinoa. Brown or wild rice. Meats and other proteins Seafood. Poultry without skin. Lean cuts of poultry and beef. Tofu. Nuts. Seeds. Dairy Low-fat or fat-free dairy products such as milk, yogurt, and cheese. The items listed above may not be a complete list of foods and beverages you can eat and drink. Contact a dietitian for more information. What foods should I avoid? Fruits Fruits canned with syrup. Vegetables Canned vegetables. Frozen vegetables with butter or cream sauce. Grains Refined white flour and flour products such as bread, pasta, snack foods, and cereals. Avoid all processed foods. Meats and other proteins Fatty cuts of meat. Poultry with skin. Breaded or fried meats. Processed meat. Avoid saturated fats. Dairy Full-fat yogurt, cheese, or milk. Beverages Sweetened drinks, such as soda or iced tea. The items listed above may not be a complete list of  foods and beverages you should avoid. Contact a dietitian for more information. Questions to ask a health care provider Do I need to meet with a certified diabetes care and education specialist? Do I need to meet with a dietitian? What number can I call if I have questions? When are the best times to check my blood glucose? Where to find more information: American Diabetes Association: diabetes.org Academy of Nutrition and Dietetics: eatright.[employer] of Diabetes and Digestive and Kidney Diseases: StageSync.si Association of Diabetes Care & Education Specialists: diabeteseducator.org Summary It is important to have healthy eating habits because your blood sugar (glucose) levels are greatly affected by what you eat and drink. It is important to use alcohol carefully. A healthy meal plan will help you manage your blood glucose and lower your risk of heart disease. Your health care provider may recommend that you work with a dietitian to make a meal plan that is best for you. This information is not intended to replace advice given to you by your health care provider. Make sure you discuss any questions you have with your health care provider. Document Revised: 08/03/2019 Document Reviewed: 08/03/2019 Elsevier Patient Education  2024 [employer].

## 2022-11-03 NOTE — Assessment & Plan Note (Signed)
Diet and nutrition discussed Benefit of exercise discussed Advised to decrease amount of daily carbohydrate intake and daily calories and increase amount of plant-based protein in her diet

## 2022-11-04 ENCOUNTER — Other Ambulatory Visit (HOSPITAL_COMMUNITY): Payer: Self-pay

## 2022-11-04 ENCOUNTER — Telehealth: Payer: Self-pay

## 2022-11-04 NOTE — Telephone Encounter (Signed)
Pharmacy Patient Advocate Encounter   Received notification from Pt Calls Mess[employer] that prior authorization for Farxiga 10mg  is required/requested.   Insurance verification completed.   The patient is insured through  [employer]  .   Per test claim: PA required; PA submitted to [employer] via Fax Key/confirmation #/EOC -- Status is pending   Phone#: [phone] Fax#: [phone]

## 2022-11-04 NOTE — Telephone Encounter (Signed)
Pharmacy Patient Advocate Encounter   Received notification from Pt Calls Messages that prior authorization for Pamela Duarte is required/requested.   Insurance verification completed.   The patient is insured through  [employer]  .   Per test claim: PA required; PA submitted to [employer] via Fax Key/confirmation #/EOC -- Status is pending   Phone#: [phone] Fax#: [phone]

## 2022-11-07 ENCOUNTER — Other Ambulatory Visit (HOSPITAL_COMMUNITY): Payer: Self-pay

## 2022-11-07 ENCOUNTER — Encounter: Payer: Self-pay | Admitting: *Deleted

## 2022-11-07 NOTE — Telephone Encounter (Signed)
Notified patient via mychart for the PA determination

## 2022-11-07 NOTE — Telephone Encounter (Signed)
Pharmacy Patient Advocate Encounter  Received notification from  [employer]  that Prior Authorization for Farxiga 10mg  tabs has been APPROVED from 11/06/22 to 12/07/22     Left a voicemail at [facility] to notify of the approval.

## 2022-11-12 ENCOUNTER — Other Ambulatory Visit (HOSPITAL_COMMUNITY): Payer: Self-pay

## 2022-11-26 ENCOUNTER — Encounter: Payer: Self-pay | Admitting: Emergency Medicine

## 2022-11-26 ENCOUNTER — Ambulatory Visit: Payer: BC Managed Care – PPO | Admitting: Emergency Medicine

## 2022-11-26 ENCOUNTER — Ambulatory Visit (INDEPENDENT_AMBULATORY_CARE_PROVIDER_SITE_OTHER): Payer: BC Managed Care – PPO | Admitting: Emergency Medicine

## 2022-11-26 VITALS — BP 144/98 | HR 68 | Temp 98.1°F | Ht 67.0 in | Wt 265.0 lb

## 2022-11-26 DIAGNOSIS — R6889 Other general symptoms and signs: Secondary | ICD-10-CM | POA: Diagnosis not present

## 2022-11-26 DIAGNOSIS — U071 COVID-19: Secondary | ICD-10-CM | POA: Insufficient documentation

## 2022-11-26 NOTE — Patient Instructions (Signed)

## 2022-11-26 NOTE — Assessment & Plan Note (Signed)
Much improved. Symptom management discussed.

## 2022-11-26 NOTE — Progress Notes (Signed)
Pamela Duarte 37 y.o.   Chief Complaint  Patient presents with   Cough    Symptoms started last Thursday, congestion, fever, and cough. Tested positive Saturday and states she's needs a note for work because she missed 3 days.  Days missed 11/8, 11/9, 11/10    HISTORY OF PRESENT ILLNESS: This is a 37 y.o. female A1A with recent COVID infection but much better today.  Had flulike symptoms for several days and had to miss work. No complications.  Improving fast.  Cough Associated symptoms include a fever and headaches. Pertinent negatives include no chest pain or rash.     Prior to Admission medications   Medication Sig Start Date End Date Taking? Authorizing Provider  atorvastatin (LIPITOR) 10 MG tablet Take 1 tablet (10 mg total) by mouth daily. 06/18/22 06/13/23  Georgina Quint, MD  Continuous Blood Gluc Sensor (FREESTYLE LIBRE 14 DAY SENSOR) MISC 1 application  by Does not apply route daily. 11/14/21   Georgina Quint, MD  dapagliflozin propanediol (FARXIGA) 10 MG TABS tablet Take 1 tablet (10 mg total) by mouth daily before breakfast. 11/03/22   Georgina Quint, MD  escitalopram (LEXAPRO) 20 MG tablet Take 1 tablet (20 mg total) by mouth daily. 09/18/22   Georgina Quint, MD  glucose blood (FREESTYLE TEST STRIPS) test strip Use as instructed Dx code: E11.9-Dispense Freestyle Libre test strips 07/26/17   Doristine Bosworth, MD  metoprolol succinate (TOPROL-XL) 50 MG 24 hr tablet Take 1 tablet (50 mg total) by mouth daily. Take with or immediately following a meal. 11/03/22   Riniyah Speich, Eilleen Kempf, MD  oxyCODONE (OXY IR/ROXICODONE) 5 MG immediate release tablet Take 1 tablet (5 mg total) by mouth every 6 (six) hours as needed for severe pain. 10/17/20   Shivaji, Valerie Roys, MD  tirzepatide [facility]) 15 MG/0.5ML Pen Inject 15 mg into the skin once a week. 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   Hyperlipidemia    Hypertension    PCOS (polycystic ovarian syndrome)     Past Surgical History:  Procedure Laterality Date   ABDOMINOPLASTY  2015   CESAREAN SECTION N/A 03/30/2019   Procedure: CESAREAN SECTION;  Surgeon: Huel Cote, MD;  Location: [facility] LD ORS;  Service: Obstetrics;  Laterality: N/A;   CESAREAN SECTION N/A 10/14/2020   Procedure: CESAREAN SECTION;  Surgeon: Lavina Hamman, MD;  Location: [facility] LD ORS;  Service: Obstetrics;  Laterality: N/A;   COSMETIC SURGERY      Social History   Socioeconomic History   Marital status: Married    Spouse name: Not on file   Number of children: 1   Years of education: Not on file   Highest education level: Master's degree (e.g., MA, MS, MEng, MEd, MSW, MBA)  Occupational History   Not on file  Tobacco Use   Smoking status: Never   Smokeless tobacco: Never  Vaping Use  Vaping status: Never Used  Substance and Sexual Activity   Alcohol use: No   Drug use: No   Sexual activity: Not Currently  Other Topics Concern   Not on file  Social History Narrative   Not on file   Social Determinants of Health   Financial Resource Strain: Low Risk  (11/03/2022)   Overall Financial Resource Strain (CARDIA)    Difficulty of Paying Living Expenses: Not hard at all  Food Insecurity: No Food Insecurity (11/03/2022)   Hunger Vital Sign    Worried About Running Out of Food in the Last Year: Never true    Ran Out of Food in the Last Year: Never true   Transportation Needs: No Transportation Needs (11/03/2022)   PRAPARE - Administrator, Civil Service (Medical): No    Lack of Transportation (Non-Medical): No  Physical Activity: Insufficiently Active (11/03/2022)   Exercise Vital Sign    Days of Exercise per Week: 3 days    Minutes of Exercise per Session: 20 min  Stress: Stress Concern Present (11/03/2022)   [employer] of Occupational Health - Occupational Stress Questionnaire    Feeling of Stress : To some extent  Social Connections: Socially Integrated (11/03/2022)   Social Connection and Isolation Panel [NHANES]    Frequency of Communication with Friends and Family: More than three times a week    Frequency of Social Gatherings with Friends and Family: More than three times a week    Attends Religious Services: 1 to 4 times per year    Active Member of [employer] or Organizations: Yes    Attends Banker Meetings: 1 to 4 times per year    Marital Status: Married  Catering manager Violence: Not on file    Family History  Problem Relation Age of Onset   Diabetes Maternal Grandmother    Hypertension Maternal Grandmother    Diabetes Paternal Grandfather    Hypertension Paternal Grandfather    Cancer Paternal Grandfather    Hypertension Paternal Grandmother    Diabetes Paternal Grandmother    Hypertension Mother      Review of Systems  Constitutional:  Positive for fever.  HENT:  Positive for congestion.   Respiratory:  Positive for cough.   Cardiovascular: Negative.  Negative for chest pain and palpitations.  Gastrointestinal:  Negative for abdominal pain, diarrhea, nausea and vomiting.  Skin: Negative.  Negative for rash.  Neurological:  Positive for headaches. Negative for dizziness.  All other systems reviewed and are negative.   Today's Vitals   11/26/22 1506  BP: (!) 144/98  Pulse: 68  Temp: 98.1 F (36.7 C)  TempSrc: Oral  SpO2: 98%  Weight: 265 lb (120.2 kg)  Height: 5\' 7"   (1.702 m)   Body mass index is 41.5 kg/m.   Physical Exam Vitals reviewed.  Constitutional:      Appearance: Normal appearance.  HENT:     Head: Normocephalic.  Eyes:     Extraocular Movements: Extraocular movements intact.  Cardiovascular:     Rate and Rhythm: Normal rate.  Pulmonary:     Effort: Pulmonary effort is normal.  Skin:    General: Skin is warm and dry.  Neurological:     Mental Status: She is alert and oriented to person, place, and time.  Psychiatric:        Mood and Affect: Mood normal.        Behavior: Behavior normal.      ASSESSMENT & PLAN: A total of 32-minute  was spent with the patient and counseling/coordination of care regarding preparing for this visit, review of most recent office visit notes, review of multiple chronic medical conditions under management, review of all medications, diagnosis of COVID infection and symptomatic management, prognosis, documentation, preparation of work note, and need for follow-up.  Problem List Items Addressed This Visit       Other   COVID-19 virus infection - Primary    Recent infection but improving fast without complications. Running its course.  No concerns.      Flu-like symptoms    Much improved. Symptom management discussed.      Patient Instructions  Health Maintenance, Female Adopting a healthy lifestyle and getting preventive care are important in promoting health and wellness. Ask your health care provider about: The right schedule for you to have regular tests and exams. Things you can do on your own to prevent diseases and keep yourself healthy. What should I know about diet, weight, and exercise? Eat a healthy diet  Eat a diet that includes plenty of vegetables, fruits, low-fat dairy products, and lean protein. Do not eat a lot of foods that are high in solid fats, added sugars, or sodium. Maintain a healthy weight Body mass index (BMI) is used to identify weight problems. It estimates  body fat based on height and weight. Your health care provider can help determine your BMI and help you achieve or maintain a healthy weight. Get regular exercise Get regular exercise. This is one of the most important things you can do for your health. Most adults should: Exercise for at least 150 minutes each week. The exercise should increase your heart rate and make you sweat (moderate-intensity exercise). Do strengthening exercises at least twice a week. This is in addition to the moderate-intensity exercise. Spend less time sitting. Even light physical activity can be beneficial. Watch cholesterol and blood lipids Have your blood tested for lipids and cholesterol at 38 years of age, then have this test every 5 years. Have your cholesterol levels checked more often if: Your lipid or cholesterol levels are high. You are older than 38 years of age. You are at high risk for heart disease. What should I know about cancer screening? Depending on your health history and family history, you may need to have cancer screening at various ages. This may include screening for: Breast cancer. Cervical cancer. Colorectal cancer. Skin cancer. Lung cancer. What should I know about heart disease, diabetes, and high blood pressure? Blood pressure and heart disease High blood pressure causes heart disease and increases the risk of stroke. This is more likely to develop in people who have high blood pressure readings or are overweight. Have your blood pressure checked: Every 3-5 years if you are 61-55 years of age. Every year if you are 27 years old or older. Diabetes Have regular diabetes screenings. This checks your fasting blood sugar level. Have the screening done: Once every three years after age 6 if you are at a normal weight and have a low risk for diabetes. More often and at a younger age if you are overweight or have a high risk for diabetes. What should I know about preventing  infection? Hepatitis B If you have a higher risk for hepatitis B, you should be screened for this virus. Talk with your health care provider to find out if you are at risk for hepatitis B infection. Hepatitis C Testing is recommended for: Everyone born from [birth year] through 1966. Anyone with known risk  factors for hepatitis C. Sexually transmitted infections (STIs) Get screened for STIs, including gonorrhea and chlamydia, if: You are sexually active and are younger than 38 years of age. You are older than 38 years of age and your health care provider tells you that you are at risk for this type of infection. Your sexual activity has changed since you were last screened, and you are at increased risk for chlamydia or gonorrhea. Ask your health care provider if you are at risk. Ask your health care provider about whether you are at high risk for HIV. Your health care provider may recommend a prescription medicine to help prevent HIV infection. If you choose to take medicine to prevent HIV, you should first get tested for HIV. You should then be tested every 3 months for as long as you are taking the medicine. Pregnancy If you are about to stop having your period (premenopausal) and you may become pregnant, seek counseling before you get pregnant. Take 400 to 800 micrograms (mcg) of folic acid every day if you become pregnant. Ask for birth control (contraception) if you want to prevent pregnancy. Osteoporosis and menopause Osteoporosis is a disease in which the bones lose minerals and strength with aging. This can result in bone fractures. If you are 83 years old or older, or if you are at risk for osteoporosis and fractures, ask your health care provider if you should: Be screened for bone loss. Take a calcium or vitamin D supplement to lower your risk of fractures. Be given hormone replacement therapy (HRT) to treat symptoms of menopause. Follow these instructions at home: Alcohol use Do not  drink alcohol if: Your health care provider tells you not to drink. You are pregnant, may be pregnant, or are planning to become pregnant. If you drink alcohol: Limit how much you have to: 0-1 drink a day. Know how much alcohol is in your drink. In the [place] one drink equals one 12 oz bottle of beer (355 mL), one 5 oz glass of wine (148 mL), or one 1 oz glass of hard liquor (44 mL). Lifestyle Do not use any products that contain nicotine or tobacco. These products include cigarettes, chewing tobacco, and vaping devices, such as e-cigarettes. If you need help quitting, ask your health care provider. Do not use street drugs. Do not share needles. Ask your health care provider for help if you need support or information about quitting drugs. General instructions Schedule regular health, dental, and eye exams. Stay current with your vaccines. Tell your health care provider if: You often feel depressed. You have ever been abused or do not feel safe at home. Summary Adopting a healthy lifestyle and getting preventive care are important in promoting health and wellness. Follow your health care provider's instructions about healthy diet, exercising, and getting tested or screened for diseases. Follow your health care provider's instructions on monitoring your cholesterol and blood pressure. This information is not intended to replace advice given to you by your health care provider. Make sure you discuss any questions you have with your health care provider. Document Revised: 05/21/2020 Document Reviewed: 05/21/2020 Elsevier Patient Education  2024 Elsevier Inc.     Edwina Barth, MD [facility]

## 2022-11-26 NOTE — Assessment & Plan Note (Signed)
Recent infection but improving fast without complications. Running its course.  No concerns.

## 2022-12-01 ENCOUNTER — Other Ambulatory Visit (HOSPITAL_COMMUNITY): Payer: Self-pay

## 2022-12-02 ENCOUNTER — Other Ambulatory Visit (HOSPITAL_COMMUNITY): Payer: Self-pay

## 2022-12-04 NOTE — Telephone Encounter (Signed)
LVM just to inform patient her PA was approved

## 2022-12-04 NOTE — Telephone Encounter (Signed)
Pharmacy Patient Advocate Encounter  Received notification from  [employer]  that Prior Authorization for Pamela Duarte has been APPROVED from 11/12/22 to 11/12/23   PA #/Case ID/Reference #: [id number]  Phone # [phone]

## 2022-12-08 ENCOUNTER — Encounter: Payer: Self-pay | Admitting: Emergency Medicine

## 2022-12-09 ENCOUNTER — Other Ambulatory Visit: Payer: Self-pay | Admitting: Radiology

## 2022-12-09 DIAGNOSIS — I152 Hypertension secondary to endocrine disorders: Secondary | ICD-10-CM

## 2022-12-09 NOTE — Telephone Encounter (Signed)
Okay to prescribe?

## 2022-12-10 MED ORDER — TIRZEPATIDE 15 MG/0.5ML ~~LOC~~ SOAJ: 15.0000 mg | SUBCUTANEOUS | 5 refills | Status: DC

## 2022-12-10 MED ORDER — DAPAGLIFLOZIN PROPANEDIOL 10 MG PO TABS: 10.0000 mg | ORAL_TABLET | Freq: Every day | ORAL | 3 refills | Status: DC

## 2022-12-10 NOTE — Telephone Encounter (Signed)
Both rx has been faxed.

## 2022-12-15 ENCOUNTER — Other Ambulatory Visit (HOSPITAL_COMMUNITY): Payer: Self-pay

## 2022-12-15 DIAGNOSIS — E119 Type 2 diabetes mellitus without complications: Secondary | ICD-10-CM | POA: Diagnosis not present

## 2022-12-15 LAB — HM DIABETES EYE EXAM

## 2022-12-16 ENCOUNTER — Other Ambulatory Visit (HOSPITAL_COMMUNITY): Payer: Self-pay

## 2022-12-19 NOTE — Telephone Encounter (Signed)
PA request has been Cancelled. See below: attached media to chart:

## 2023-01-21 ENCOUNTER — Other Ambulatory Visit (HOSPITAL_COMMUNITY): Payer: Self-pay

## 2023-01-21 ENCOUNTER — Telehealth: Payer: Self-pay

## 2023-01-21 ENCOUNTER — Ambulatory Visit: Payer: BC Managed Care – PPO | Admitting: Emergency Medicine

## 2023-01-21 NOTE — Telephone Encounter (Signed)
 Pharmacy Patient Advocate Encounter   Received notification from CoverMyMeds that prior authorization for Mounjaro  15MG /0.5ML auto-injectors is required/requested.   Insurance verification completed.   The patient is insured through [facility] .   Per test claim: PA required; PA submitted to above mentioned insurance via CoverMyMeds Key/confirmation #/EOC [id number] Status is pending

## 2023-01-21 NOTE — Telephone Encounter (Signed)
 Pharmacy Patient Advocate Encounter  Received notification from [employer] that Prior Authorization for Mounjaro  15MG /0.5ML auto-injectors has been APPROVED from 01/21/23 to 01/21/24. Ran test claim, Copay is $0.00. This test claim was processed through [facility]- copay amounts may vary at other pharmacies due to pharmacy/plan contracts, or as the patient moves through the different stages of their insurance plan.   PA #/Case ID/Reference #: [id number]

## 2023-01-22 ENCOUNTER — Encounter: Payer: Self-pay | Admitting: Emergency Medicine

## 2023-01-22 ENCOUNTER — Other Ambulatory Visit (HOSPITAL_COMMUNITY): Payer: Self-pay

## 2023-01-22 NOTE — Telephone Encounter (Signed)
 PA request has been Submitted. New Encounter created for follow up. For additional info see Pharmacy Prior Auth telephone encounter from 01/21/23.

## 2023-02-10 ENCOUNTER — Encounter: Payer: BC Managed Care – PPO | Admitting: Emergency Medicine

## 2023-04-18 ENCOUNTER — Other Ambulatory Visit: Payer: Self-pay | Admitting: Emergency Medicine

## 2023-07-17 ENCOUNTER — Other Ambulatory Visit: Payer: Self-pay | Admitting: Emergency Medicine

## 2023-10-04 ENCOUNTER — Other Ambulatory Visit: Payer: Self-pay | Admitting: Emergency Medicine

## 2023-10-04 DIAGNOSIS — F4321 Adjustment disorder with depressed mood: Secondary | ICD-10-CM

## 2023-10-11 ENCOUNTER — Other Ambulatory Visit: Payer: Self-pay | Admitting: Emergency Medicine

## 2023-10-11 DIAGNOSIS — I152 Hypertension secondary to endocrine disorders: Secondary | ICD-10-CM

## 2023-10-20 ENCOUNTER — Other Ambulatory Visit: Payer: Self-pay | Admitting: Emergency Medicine

## 2023-10-20 DIAGNOSIS — E1159 Type 2 diabetes mellitus with other circulatory complications: Secondary | ICD-10-CM

## 2023-11-04 ENCOUNTER — Inpatient Hospital Stay: Admitting: Emergency Medicine

## 2023-11-15 ENCOUNTER — Other Ambulatory Visit: Payer: Self-pay | Admitting: Emergency Medicine

## 2023-11-15 DIAGNOSIS — I152 Hypertension secondary to endocrine disorders: Secondary | ICD-10-CM

## 2023-11-18 ENCOUNTER — Encounter: Payer: Self-pay | Admitting: Emergency Medicine

## 2023-11-18 ENCOUNTER — Ambulatory Visit: Admitting: Emergency Medicine

## 2023-11-18 VITALS — BP 116/80 | HR 85 | Temp 98.3°F | Ht 67.0 in

## 2023-11-18 DIAGNOSIS — Z23 Encounter for immunization: Secondary | ICD-10-CM

## 2023-11-18 DIAGNOSIS — E6609 Other obesity due to excess calories: Secondary | ICD-10-CM

## 2023-11-18 DIAGNOSIS — I152 Hypertension secondary to endocrine disorders: Secondary | ICD-10-CM | POA: Diagnosis not present

## 2023-11-18 DIAGNOSIS — F4321 Adjustment disorder with depressed mood: Secondary | ICD-10-CM

## 2023-11-18 DIAGNOSIS — E66812 Obesity, class 2: Secondary | ICD-10-CM | POA: Diagnosis not present

## 2023-11-18 DIAGNOSIS — E1159 Type 2 diabetes mellitus with other circulatory complications: Secondary | ICD-10-CM | POA: Diagnosis not present

## 2023-11-18 DIAGNOSIS — Z87442 Personal history of urinary calculi: Secondary | ICD-10-CM | POA: Insufficient documentation

## 2023-11-18 DIAGNOSIS — Z7985 Long-term (current) use of injectable non-insulin antidiabetic drugs: Secondary | ICD-10-CM

## 2023-11-18 DIAGNOSIS — Z6839 Body mass index (BMI) 39.0-39.9, adult: Secondary | ICD-10-CM

## 2023-11-18 DIAGNOSIS — Z7984 Long term (current) use of oral hypoglycemic drugs: Secondary | ICD-10-CM

## 2023-11-18 LAB — POCT GLYCOSYLATED HEMOGLOBIN (HGB A1C): HbA1c POC (<> result, manual entry): 5.8 % (ref 4.0–5.6)

## 2023-11-18 MED ORDER — METOPROLOL SUCCINATE ER 50 MG PO TB24: 50.0000 mg | ORAL_TABLET | Freq: Every day | ORAL | 0 refills | Status: AC

## 2023-11-18 MED ORDER — ESCITALOPRAM OXALATE 20 MG PO TABS: 20.0000 mg | ORAL_TABLET | Freq: Every day | ORAL | 3 refills | Status: AC

## 2023-11-18 MED ORDER — FARXIGA 10 MG PO TABS: 10.0000 mg | ORAL_TABLET | Freq: Every day | ORAL | 3 refills | Status: AC

## 2023-11-18 MED ORDER — MOUNJARO 15 MG/0.5ML ~~LOC~~ SOAJ: 15.0000 mg | SUBCUTANEOUS | 3 refills | Status: AC

## 2023-11-18 MED ORDER — ATORVASTATIN CALCIUM 10 MG PO TABS: 10.0000 mg | ORAL_TABLET | Freq: Every day | ORAL | 3 refills | Status: AC

## 2023-11-18 NOTE — Patient Instructions (Signed)

## 2023-11-18 NOTE — Assessment & Plan Note (Signed)
 Recently diagnosed Asymptomatic today Diet and nutrition discussed Advised to stay well-hydrated Will follow-up with urologist who recommended follow-up ultrasound

## 2023-11-18 NOTE — Progress Notes (Signed)
 Pamela Duarte 38 y.o.   Chief Complaint  Patient presents with   Follow-up    Pt states that she is feeling better, a renal ultrasound should be done on the right side per her urologist / pt states that she is constipated as well     HISTORY OF PRESENT ILLNESS: This is a 38 y.o. female A1A here for follow-up of chronic medical conditions and also recent hospitalization up in [place]  when she was admitted for kidney stone Overall doing well. Has no other complaint or medical concerns today. Wt Readings from Last 3 Encounters:  11/26/22 265 lb (120.2 kg)  11/03/22 264 lb 4 oz (119.9 kg)  05/15/22 263 lb 8 oz (119.5 kg)     HPI   Prior to Admission medications   Medication Sig Start Date End Date Taking? Authorizing Provider  atorvastatin  (LIPITOR) 10 MG tablet Take 1 tablet by mouth once daily 07/18/23  Yes Tosh Glaze Jose, MD  escitalopram  (LEXAPRO ) 20 MG tablet Take 1 tablet by mouth once daily 10/04/23  Yes Lakela Kuba Jose, MD  FARXIGA  10 MG TABS tablet TAKE 1 TABLET BY MOUTH ONCE DAILY BEFORE BREAKFAST 10/20/23  Yes Hayzlee Mcsorley Jose, MD  metoprolol  succinate (TOPROL -XL) 50 MG 24 hr tablet TAKE 1 TABLET BY MOUTH ONCE DAILY (TAKE  WITH  OR  IMMEDIATELY  FOLLOWING  A  MEAL) 10/11/23  Yes [name], Emil Schanz, MD  MOUNJARO  15 MG/0.5ML Pen INJECT ONE [facility] INTO THE SKIN ONCE WEEKLY 11/16/23  Yes Alnisa Hasley, Emil Schanz, MD  oxyCODONE  (OXY IR/ROXICODONE ) 5 MG immediate release tablet Take 1 tablet (5 mg total) by mouth every 6 (six) hours as needed for severe pain. 10/17/20  Yes Shivaji, Lavonia HERO, MD  Continuous Blood Gluc Sensor (FREESTYLE LIBRE 14 DAY SENSOR) MISC 1 application  by Does not apply route daily. 11/14/21   Purcell Emil Schanz, MD  glucose blood (FREESTYLE TEST STRIPS) test strip Use as instructed Dx code: E11.9-Dispense Freestyle Libre test strips 07/26/17   Bettie Santana LABOR, MD    No Known Allergies  Patient Active Problem List   Diagnosis  Date Noted   COVID-19 virus infection 11/26/2022   Flu-like symptoms 11/26/2022   Situational depression 08/22/2021   S/P cesarean section 10/14/2020   Atrial tachycardia 10/19/2019   Hypertension associated with diabetes (HCC) 07/26/2019   Newly diagnosed diabetes (HCC) 06/26/2017   Class 2 obesity due to excess calories without serious comorbidity with body mass index (BMI) of 39.0 to 39.9 in adult 06/26/2017    Past Medical History:  Diagnosis Date   Atrial fibrillation (HCC)    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   Hyperlipidemia    Hypertension    PCOS (polycystic ovarian syndrome)     Past Surgical History:  Procedure Laterality Date   ABDOMINOPLASTY  2015   CESAREAN SECTION N/A 03/30/2019   Procedure: CESAREAN SECTION;  Surgeon: Estelle Service, MD;  Location: [facility]S;  Service: Obstetrics;  Laterality: N/A;   CESAREAN SECTION N/A 10/14/2020   Procedure: CESAREAN SECTION;  Surgeon: Horacio Boas, MD;  Location: [facility];  Service: Obstetrics;  Laterality: N/A;   COSMETIC SURGERY      Social History   Socioeconomic History   Marital status: Married    Spouse name: Not on file   Number of children: 1   Years of education: Not on file   Highest education level: Master's degree (e.g., MA, MS, MEng, MEd, MSW, MBA)  Occupational History   Not on file  Tobacco Use   Smoking status: Never   Smokeless tobacco: Never  Vaping Use   Vaping status: Never Used  Substance and Sexual Activity   Alcohol use: No   Drug use: No   Sexual activity: Not Currently  Other Topics Concern   Not on file  Social History Narrative   Not on file   Social Drivers of Health   Financial Resource Strain: Low Risk (10/09/2023)   Received from [facility]   Overall Financial Resource Strain (CARDIA)    How hard is it for you to pay for the very basics like food, housing, medical care, and heating?: Not very hard  Food Insecurity:  Patient Declined (10/09/2023)   Received from [facility]   Hunger Vital Sign    Within the past 12 months, you worried that your food would run out before you got the money to buy more.: Patient declined    Within the past 12 months, the food you bought just didn't last and you didn't have money to get more.: Patient declined  Transportation Needs: Unknown (10/09/2023)   Received from [facility]   PRAPARE - Transportation    In the past 12 months, has lack of transportation kept you from medical appointments or from getting medications?: Patient declined    Lack of Transportation (Non-Medical): Not on file  Physical Activity: Insufficiently Active (11/03/2022)   Exercise Vital Sign    Days of Exercise per Week: 3 days    Minutes of Exercise per Session: 20 min  Stress: Stress Concern Present (11/03/2022)   [employer] of Occupational Health - Occupational Stress Questionnaire    Feeling of Stress : To some extent  Social Connections: Socially Integrated (11/03/2022)   Social Connection and Isolation Panel    Frequency of Communication with Friends and Family: More than three times a week    Frequency of Social Gatherings with Friends and Family: More than three times a week    Attends Religious Services: 1 to 4 times per year    Active Member of [employer] or Organizations: Yes    Attends Banker Meetings: 1 to 4 times per year    Marital Status: Married  Catering Manager Violence: Not on file    Family History  Problem Relation Age of Onset   Diabetes Maternal Grandmother    Hypertension Maternal Grandmother    Diabetes Paternal Grandfather    Hypertension Paternal Grandfather    Cancer Paternal Grandfather    Hypertension Paternal Grandmother    Diabetes Paternal Grandmother    Hypertension Mother      Review of Systems  Constitutional: Negative.  Negative for chills and fever.  HENT: Negative.  Negative for  congestion and sore throat.   Respiratory: Negative.  Negative for cough and shortness of breath.   Cardiovascular: Negative.  Negative for chest pain and palpitations.  Gastrointestinal:  Negative for abdominal pain, diarrhea, nausea and vomiting.  Genitourinary: Negative.  Negative for dysuria and hematuria.  Skin: Negative.  Negative for rash.  Neurological: Negative.  Negative for dizziness and headaches.  All other systems reviewed and are negative.   Vitals:   11/18/23 1028  BP: 116/80  Pulse: 85  Temp: 98.3 F (36.8 C)  SpO2: 98%    Physical Exam Vitals reviewed.  Constitutional:      Appearance: Normal appearance.  HENT:     Head: Normocephalic.  Mouth/Throat:     Mouth: Mucous membranes are moist.     Pharynx: Oropharynx is clear.  Eyes:     Extraocular Movements: Extraocular movements intact.  Cardiovascular:     Rate and Rhythm: Normal rate and regular rhythm.     Pulses: Normal pulses.     Heart sounds: Normal heart sounds.  Pulmonary:     Effort: Pulmonary effort is normal.     Breath sounds: Normal breath sounds.  Musculoskeletal:     Cervical back: No tenderness.  Lymphadenopathy:     Cervical: No cervical adenopathy.  Skin:    General: Skin is warm and dry.     Capillary Refill: Capillary refill takes less than 2 seconds.  Neurological:     General: No focal deficit present.     Mental Status: She is alert and oriented to person, place, and time.  Psychiatric:        Mood and Affect: Mood normal.        Behavior: Behavior normal.    Results for orders placed or performed in visit on 11/18/23 (from the past 24 hours)  POCT HgB A1C     Status: Abnormal   Collection Time: 11/18/23 11:04 AM  Result Value Ref Range   Hemoglobin A1C     HbA1c POC (<> result, manual entry) 5.8 4.0 - 5.6 %   HbA1c, POC (prediabetic range)     HbA1c, POC (controlled diabetic range)       ASSESSMENT & PLAN: A total of 42 minutes was spent with the patient and  counseling/coordination of care regarding preparing for this visit, review of most recent office visit notes, review of multiple chronic medical conditions and their management, cardiovascular risks associated with hypertension and diabetes, review of all medications, review of most recent bloodwork results including interpretation of today's hemoglobin A1c, review of health maintenance items, education on nutrition, prognosis, documentation, and need for follow up.   Problem List Items Addressed This Visit       Cardiovascular and Mediastinum   Hypertension associated with diabetes (HCC) - Primary   BP Readings from Last 3 Encounters:  11/18/23 116/80  11/26/22 (!) 144/98  11/03/22 120/70  Well-controlled hypertension Continue metoprolol  succinate 50 mg daily Well-controlled diabetes with hemoglobin A1c of 5.8 Continue weekly Mounjaro  15 mg and daily Farxiga  10 mg daily Cardiovascular risks associated with hypertension and diabetes discussed Diet and nutrition discussed Follow-up in 6 months       Relevant Medications   atorvastatin  (LIPITOR) 10 MG tablet   FARXIGA  10 MG TABS tablet   metoprolol  succinate (TOPROL -XL) 50 MG 24 hr tablet   tirzepatide  (MOUNJARO ) 15 MG/0.5ML Pen   Other Relevant Orders   POCT HgB A1C (Completed)     Other   Class 2 obesity due to excess calories without serious comorbidity with body mass index (BMI) of 39.0 to 39.9 in adult   Eating better and losing weight successfully Diet and nutrition discussed Benefit of exercise discussed Advised to decrease amount of daily carbohydrate intake and daily calories and increase amount of plant-based protein in her diet      Relevant Medications   FARXIGA  10 MG TABS tablet   tirzepatide  (MOUNJARO ) 15 MG/0.5ML Pen   Situational depression   Relevant Medications   escitalopram  (LEXAPRO ) 20 MG tablet   History of kidney stones   Recently diagnosed Asymptomatic today Diet and nutrition discussed Advised to  stay well-hydrated Will follow-up with urologist who recommended follow-up ultrasound  Relevant Orders   US  Renal   Other Visit Diagnoses       Flu vaccine need       Relevant Orders   Flu vaccine trivalent PF, 6mos and older(Flulaval,Afluria,Fluarix,Fluzone) (Completed)      Patient Instructions  Health Maintenance, Female Adopting a healthy lifestyle and getting preventive care are important in promoting health and wellness. Ask your health care provider about: The right schedule for you to have regular tests and exams. Things you can do on your own to prevent diseases and keep yourself healthy. What should I know about diet, weight, and exercise? Eat a healthy diet  Eat a diet that includes plenty of vegetables, fruits, low-fat dairy products, and lean protein. Do not eat a lot of foods that are high in solid fats, added sugars, or sodium. Maintain a healthy weight Body mass index (BMI) is used to identify weight problems. It estimates body fat based on height and weight. Your health care provider can help determine your BMI and help you achieve or maintain a healthy weight. Get regular exercise Get regular exercise. This is one of the most important things you can do for your health. Most adults should: Exercise for at least 150 minutes each week. The exercise should increase your heart rate and make you sweat (moderate-intensity exercise). Do strengthening exercises at least twice a week. This is in addition to the moderate-intensity exercise. Spend less time sitting. Even light physical activity can be beneficial. Watch cholesterol and blood lipids Have your blood tested for lipids and cholesterol at 39 years of age, then have this test every 5 years. Have your cholesterol levels checked more often if: Your lipid or cholesterol levels are high. You are older than 39 years of age. You are at high risk for heart disease. What should I know about cancer  screening? Depending on your health history and family history, you may need to have cancer screening at various ages. This may include screening for: Breast cancer. Cervical cancer. Colorectal cancer. Skin cancer. Lung cancer. What should I know about heart disease, diabetes, and high blood pressure? Blood pressure and heart disease High blood pressure causes heart disease and increases the risk of stroke. This is more likely to develop in people who have high blood pressure readings or are overweight. Have your blood pressure checked: Every 3-5 years if you are 61-36 years of age. Every year if you are 72 years old or older. Diabetes Have regular diabetes screenings. This checks your fasting blood sugar level. Have the screening done: Once every three years after age 56 if you are at a normal weight and have a low risk for diabetes. More often and at a younger age if you are overweight or have a high risk for diabetes. What should I know about preventing infection? Hepatitis B If you have a higher risk for hepatitis B, you should be screened for this virus. Talk with your health care provider to find out if you are at risk for hepatitis B infection. Hepatitis C Testing is recommended for: Everyone born from [birth year] through 1966. Anyone with known risk factors for hepatitis C. Sexually transmitted infections (STIs) Get screened for STIs, including gonorrhea and chlamydia, if: You are sexually active and are younger than 39 years of age. You are older than 39 years of age and your health care provider tells you that you are at risk for this type of infection. Your sexual activity has changed since you were last screened, and  you are at increased risk for chlamydia or gonorrhea. Ask your health care provider if you are at risk. Ask your health care provider about whether you are at high risk for HIV. Your health care provider may recommend a prescription medicine to help prevent HIV  infection. If you choose to take medicine to prevent HIV, you should first get tested for HIV. You should then be tested every 3 months for as long as you are taking the medicine. Pregnancy If you are about to stop having your period (premenopausal) and you may become pregnant, seek counseling before you get pregnant. Take 400 to 800 micrograms (mcg) of folic acid every day if you become pregnant. Ask for birth control (contraception) if you want to prevent pregnancy. Osteoporosis and menopause Osteoporosis is a disease in which the bones lose minerals and strength with aging. This can result in bone fractures. If you are 46 years old or older, or if you are at risk for osteoporosis and fractures, ask your health care provider if you should: Be screened for bone loss. Take a calcium  or vitamin D  supplement to lower your risk of fractures. Be given hormone replacement therapy (HRT) to treat symptoms of menopause. Follow these instructions at home: Alcohol use Do not drink alcohol if: Your health care provider tells you not to drink. You are pregnant, may be pregnant, or are planning to become pregnant. If you drink alcohol: Limit how much you have to: 0-1 drink a day. Know how much alcohol is in your drink. In the [place] one drink equals one 12 oz bottle of beer (355 mL), one 5 oz glass of wine (148 mL), or one 1 oz glass of hard liquor (44 mL). Lifestyle Do not use any products that contain nicotine or tobacco. These products include cigarettes, chewing tobacco, and vaping devices, such as e-cigarettes. If you need help quitting, ask your health care provider. Do not use street drugs. Do not share needles. Ask your health care provider for help if you need support or information about quitting drugs. General instructions Schedule regular health, dental, and eye exams. Stay current with your vaccines. Tell your health care provider if: You often feel depressed. You have ever been abused  or do not feel safe at home. Summary Adopting a healthy lifestyle and getting preventive care are important in promoting health and wellness. Follow your health care provider's instructions about healthy diet, exercising, and getting tested or screened for diseases. Follow your health care provider's instructions on monitoring your cholesterol and blood pressure. This information is not intended to replace advice given to you by your health care provider. Make sure you discuss any questions you have with your health care provider. Document Revised: 05/21/2020 Document Reviewed: 05/21/2020 Elsevier Patient Education  2024 Elsevier Inc.     Emil Schaumann, MD [facility]

## 2023-11-18 NOTE — Assessment & Plan Note (Signed)
 BP Readings from Last 3 Encounters:  11/18/23 116/80  11/26/22 (!) 144/98  11/03/22 120/70  Well-controlled hypertension Continue metoprolol  succinate 50 mg daily Well-controlled diabetes with hemoglobin A1c of 5.8 Continue weekly Mounjaro  15 mg and daily Farxiga  10 mg daily Cardiovascular risks associated with hypertension and diabetes discussed Diet and nutrition discussed Follow-up in 6 months

## 2023-11-18 NOTE — Assessment & Plan Note (Signed)
 Eating better and losing weight successfully Diet and nutrition discussed Benefit of exercise discussed Advised to decrease amount of daily carbohydrate intake and daily calories and increase amount of plant-based protein in her diet

## 2023-11-19 ENCOUNTER — Ambulatory Visit
Admission: RE | Admit: 2023-11-19 | Discharge: 2023-11-19 | Disposition: A | Discharge status: Home / Self Care | Source: Ambulatory Visit | Attending: Emergency Medicine | Admitting: Emergency Medicine

## 2023-11-19 DIAGNOSIS — Z87442 Personal history of urinary calculi: Secondary | ICD-10-CM

## 2023-11-24 ENCOUNTER — Ambulatory Visit: Payer: Self-pay | Admitting: Emergency Medicine

## 2023-12-24 ENCOUNTER — Other Ambulatory Visit (HOSPITAL_COMMUNITY): Payer: Self-pay

## 2023-12-24 ENCOUNTER — Telehealth: Payer: Self-pay | Admitting: Pharmacy Technician

## 2023-12-24 NOTE — Telephone Encounter (Signed)
 Pharmacy Patient Advocate Encounter   Received notification from CoverMyMeds that prior authorization for Mounjaro  15MG /0.5ML auto-injectors  is due for renewal.   Insurance verification completed.   The patient is insured through [facility].  Action: PA required; PA started via CoverMyMeds. KEY [id number] . Waiting for clinical questions to populate.

## 2023-12-24 NOTE — Telephone Encounter (Signed)
 Pharmacy Patient Advocate Encounter  Received notification from [facility] that Prior Authorization for Mounjaro  15MG /0.5ML auto-injectors  has been CANCELLED due to no PA needed right now. Previous PA expires in January.   PA #/Case ID/Reference #: [id number]

## 2024-01-05 LAB — OPHTHALMOLOGY REPORT-SCANNED
